# Patient Record
Sex: Female | Born: 1955 | Race: White | Hispanic: No | Marital: Married | Smoking: Never smoker
Health system: Southern US, Community
[De-identification: ages and names within clinical notes are randomized; demographics above are authoritative.]

## PROBLEM LIST (undated history)

## (undated) DIAGNOSIS — E039 Hypothyroidism, unspecified: Secondary | ICD-10-CM

## (undated) DIAGNOSIS — H269 Unspecified cataract: Secondary | ICD-10-CM

## (undated) DIAGNOSIS — J309 Allergic rhinitis, unspecified: Secondary | ICD-10-CM

## (undated) DIAGNOSIS — R112 Nausea with vomiting, unspecified: Secondary | ICD-10-CM

## (undated) DIAGNOSIS — C4491 Basal cell carcinoma of skin, unspecified: Secondary | ICD-10-CM

## (undated) DIAGNOSIS — T7840XA Allergy, unspecified, initial encounter: Secondary | ICD-10-CM

## (undated) DIAGNOSIS — E785 Hyperlipidemia, unspecified: Secondary | ICD-10-CM

## (undated) DIAGNOSIS — M1990 Unspecified osteoarthritis, unspecified site: Secondary | ICD-10-CM

## (undated) DIAGNOSIS — E079 Disorder of thyroid, unspecified: Secondary | ICD-10-CM

## (undated) DIAGNOSIS — E559 Vitamin D deficiency, unspecified: Secondary | ICD-10-CM

## (undated) DIAGNOSIS — Z9889 Other specified postprocedural states: Secondary | ICD-10-CM

## (undated) HISTORY — DX: Vitamin D deficiency, unspecified: E55.9

## (undated) HISTORY — PX: TYMPANOPLASTY: SHX33

## (undated) HISTORY — DX: Basal cell carcinoma of skin, unspecified: C44.91

## (undated) HISTORY — DX: Allergic rhinitis, unspecified: J30.9

## (undated) HISTORY — PX: WISDOM TOOTH EXTRACTION: SHX21

## (undated) HISTORY — DX: Hyperlipidemia, unspecified: E78.5

## (undated) HISTORY — DX: Allergy, unspecified, initial encounter: T78.40XA

## (undated) HISTORY — PX: TUBAL LIGATION: SHX77

## (undated) HISTORY — DX: Unspecified osteoarthritis, unspecified site: M19.90

## (undated) HISTORY — DX: Unspecified cataract: H26.9

---

## 1998-10-30 ENCOUNTER — Other Ambulatory Visit: Admission: RE | Admit: 1998-10-30 | Discharge: 1998-10-30 | Payer: Self-pay | Admitting: Obstetrics & Gynecology

## 1999-01-10 ENCOUNTER — Ambulatory Visit (HOSPITAL_COMMUNITY): Admission: RE | Admit: 1999-01-10 | Discharge: 1999-01-10 | Payer: Self-pay | Admitting: Obstetrics & Gynecology

## 2002-05-05 ENCOUNTER — Other Ambulatory Visit: Admission: RE | Admit: 2002-05-05 | Discharge: 2002-05-05 | Payer: Self-pay | Admitting: Obstetrics and Gynecology

## 2002-05-12 ENCOUNTER — Encounter: Payer: Self-pay | Admitting: Obstetrics and Gynecology

## 2002-05-12 ENCOUNTER — Ambulatory Visit (HOSPITAL_COMMUNITY): Admission: RE | Admit: 2002-05-12 | Discharge: 2002-05-12 | Payer: Self-pay | Admitting: Obstetrics and Gynecology

## 2003-09-07 ENCOUNTER — Other Ambulatory Visit: Admission: RE | Admit: 2003-09-07 | Discharge: 2003-09-07 | Payer: Self-pay | Admitting: Obstetrics and Gynecology

## 2004-10-17 ENCOUNTER — Other Ambulatory Visit: Admission: RE | Admit: 2004-10-17 | Discharge: 2004-10-17 | Payer: Self-pay | Admitting: Obstetrics and Gynecology

## 2004-10-28 ENCOUNTER — Encounter: Admission: RE | Admit: 2004-10-28 | Discharge: 2004-10-28 | Payer: Self-pay | Admitting: Obstetrics and Gynecology

## 2004-11-11 ENCOUNTER — Ambulatory Visit: Payer: Self-pay | Admitting: Internal Medicine

## 2004-12-03 ENCOUNTER — Ambulatory Visit: Payer: Self-pay | Admitting: Internal Medicine

## 2005-04-06 ENCOUNTER — Ambulatory Visit: Payer: Self-pay | Admitting: Internal Medicine

## 2005-11-04 ENCOUNTER — Other Ambulatory Visit: Admission: RE | Admit: 2005-11-04 | Discharge: 2005-11-04 | Payer: Self-pay | Admitting: Obstetrics and Gynecology

## 2009-11-30 HISTORY — PX: COLONOSCOPY: SHX174

## 2009-12-11 ENCOUNTER — Encounter (INDEPENDENT_AMBULATORY_CARE_PROVIDER_SITE_OTHER): Payer: Self-pay | Admitting: *Deleted

## 2010-06-27 ENCOUNTER — Telehealth: Payer: Self-pay | Admitting: Internal Medicine

## 2010-07-10 ENCOUNTER — Encounter (INDEPENDENT_AMBULATORY_CARE_PROVIDER_SITE_OTHER): Payer: Self-pay | Admitting: *Deleted

## 2010-09-09 ENCOUNTER — Encounter (INDEPENDENT_AMBULATORY_CARE_PROVIDER_SITE_OTHER): Payer: Self-pay | Admitting: *Deleted

## 2010-09-12 ENCOUNTER — Ambulatory Visit: Payer: Self-pay | Admitting: Internal Medicine

## 2010-09-17 ENCOUNTER — Telehealth: Payer: Self-pay | Admitting: Internal Medicine

## 2010-09-19 ENCOUNTER — Ambulatory Visit: Payer: Self-pay | Admitting: Internal Medicine

## 2010-09-24 ENCOUNTER — Encounter: Payer: Self-pay | Admitting: Internal Medicine

## 2010-09-25 LAB — HM COLONOSCOPY

## 2010-12-30 NOTE — Progress Notes (Signed)
----   Converted from flag ---- ---- 09/17/2010 12:37 PM, Vallarie Mare wrote: pharmacy verified and correct, please resend prep and call pt... [phone] ------------------------------  Phone Note Call from Patient   Caller: Patient Summary of Call: States that prep wasn't at pharmacy.  I called pharmacy and they did not receive it.  I gave them a verbal ok.   Initial call taken by: Milford Cage NCMA,  September 17, 2010 1:36 PM

## 2010-12-30 NOTE — Procedures (Signed)
Summary: Colonoscopy  Patient: [name] Note: All result statuses are Final unless otherwise noted.  Tests: (1) Colonoscopy (COL)   COL Colonoscopy           DONE     [facility]     [address]     [place], [place]  [address]           COLONOSCOPY PROCEDURE REPORT           PATIENT:  Tracy Brady, Tracy Brady  MR#:  [id number]     BIRTHDATE:  02-22-1956, 54 yrs. old  GENDER:  female     ENDOSCOPIST:  Wilhemina Bonito. Eda Keys, MD     REF. BY:  Surveillance Program Recall,     PROCEDURE DATE:  09/19/2010     PROCEDURE:  Colonoscopy with snare polypectomy x 1     ASA CLASS:  Class II     INDICATIONS:  history of polyps, surveillance and high-risk     screening ; index exam 11-2004 w/ HP polyp     MEDICATIONS:   Fentanyl 50 mcg IV, Versed 7 mg IV           DESCRIPTION OF PROCEDURE:   After the risks benefits and     alternatives of the procedure were thoroughly explained, informed     consent was obtained.  Digital rectal exam was performed and     revealed no abnormalities.   The LB CF-[id number] endoscope     was introduced through the anus and advanced to the cecum, which     was identified by both the appendix and ileocecal valve, without     limitations.Time to cecum = 4:13 min. The quality of the prep was     good, using MoviPrep.  The instrument was then slowly withdrawn     (time = 16;25 min) as the colon was fully examined.     <<PROCEDUREIMAGES>>           FINDINGS:  A diminutive polyp was found in the ascending colon.     Polyp was snared without cautery. Retrieval was successful.   This     was otherwise a normal examination of the colon.   Retroflexed     views in the rectum revealed no abnormalities.    The scope was     then withdrawn from the patient and the procedure completed.           COMPLICATIONS:  None           ENDOSCOPIC IMPRESSION:     1) Diminutive polyp in the ascending colon - removed     2) Otherwise normal examination            RECOMMENDATIONS:     1) Repeat colonoscopy in 5 years if polyp adenomatous; otherwise     10 years           ______________________________     Wilhemina Bonito. Eda Keys, MD           CC:  Lucky Cowboy, MD;  The Patient           n.     eSIGNED:   Wilhemina Bonito. Eda Keys at 09/19/2010 02:32 PM           Bridgette Habermann, [id number]  Note: An exclamation mark (!) indicates a result that was not dispersed into the flowsheet. Document Creation Date: 09/19/2010 2:33 PM _______________________________________________________________________  (1) Order result status: Final Collection or observation date-time: 09/19/2010  14:19 Requested date-time:  Receipt date-time:  Reported date-time:  Referring Physician:   Ordering Physician: Fransico Setters [phone]) Specimen Source:  Source: Launa Grill Order Number: [id number] Lab site:   Appended Document: Colonoscopy recall 10 yrs     Procedures Next Due Date:    Colonoscopy: 08/2020

## 2010-12-30 NOTE — Letter (Signed)
Summary: Colonoscopy Letter  [facility]  [address] [place], [place] [address]   Phone: [phone]  Fax: [phone]      December 11, 2009 MRN: [id number]   LAURANA MAGISTRO [address] [place], [place]  [address]   Dear [name],   According to your medical record, it is time for you to schedule a Colonoscopy. The American Cancer Society recommends this procedure as a method to detect early colon cancer. Patients with a family history of colon cancer, or a personal history of colon polyps or inflammatory bowel disease are at increased risk.  This letter has beeen generated based on the recommendations made at the time of your procedure. If you feel that in your particular situation this may no longer apply, please contact our office.  Please call our office at [phone] to schedule this appointment or to update your records at your earliest convenience.  Thank you for cooperating with [place] to provide you with the very best care possible.   Sincerely,  Wilhemina Bonito. Marina Goodell, M.D.  [facility] [phone]

## 2010-12-30 NOTE — Letter (Signed)
Summary: Patient Notice- Polyp Results  [facility]  [address] [place], [place] [address]   Phone: [phone]  Fax: [phone]        September 24, 2010 MRN: [id number]    [facility] [address] [place], [place]  [address]    Dear [name],  I am pleased to inform you that the colon polyp(s) removed during your recent colonoscopy was (were) found to be benign (no cancer detected) upon pathologic examination.  I recommend you have a repeat colonoscopy examination in 10 years to look for recurrent polyps, as having colon polyps increases your risk for having recurrent polyps or even colon cancer in the future.  Should you develop new or worsening symptoms of abdominal pain, bowel habit changes or bleeding from the rectum or bowels, please schedule an evaluation with either your primary care physician or with me.   Additional information/recommendations:  __ No further action with gastroenterology is needed at this time. Please      follow-up with your primary care physician for your other healthcare      needs.   Please call us if you are having persistent problems or have questions about your condition that have not been fully answered at this time.  Sincerely,  Hilarie Fredrickson MD  This letter has been electronically signed by your physician.  Appended Document: Patient Notice- Polyp Results letter mailed 10.28.11

## 2010-12-30 NOTE — Miscellaneous (Signed)
Summary: previsit prep/rm  Clinical Lists Changes  Medications: Added new medication of MOVIPREP 100 GM  SOLR (PEG-KCL-NACL-NASULF-NA ASC-C) As per prep instructions. - Signed Rx of MOVIPREP 100 GM  SOLR (PEG-KCL-NACL-NASULF-NA ASC-C) As per prep instructions.;  #1 x 0;  Signed;  Entered by: Sherren Kerns RN;  Authorized by: Hilarie Fredrickson MD;  Method used: [id number] [address] [place], [place], [place]  [address], Ph: [phone], Fax: [phone] Observations: Added new observation of ALLERGY REV: Done (09/12/2010 12:49) Added new observation of NKA: T (09/12/2010 12:49)    Prescriptions: MOVIPREP 100 GM  SOLR (PEG-KCL-NACL-NASULF-NA ASC-C) As per prep instructions.  #1 x 0   Entered by:   Sherren Kerns RN   Authorized by:   Hilarie Fredrickson MD   Signed by:   Sherren Kerns RN on 09/12/2010   Method used:   [facility]  [address] [id number]       [address]       [place], [place]  [address]       Ph: [phone]       Fax: [phone]   RxID:   [id number]

## 2010-12-30 NOTE — Progress Notes (Signed)
Summary: Schedule Colonoscopy  Phone Note Outgoing Call Call back at Home Phone [phone]   Call placed by: Harlow Mares CMA Duncan Dull),  June 27, 2010 2:57 PM Call placed to: Patient Summary of Call: Left message on patients machine to call back. patinet is due for a colonoscopy hyperplastic polyps in 2006 Initial call taken by: Harlow Mares CMA Duncan Dull),  June 27, 2010 2:57 PM  Follow-up for Phone Call        patient scheduled colonoscopy for 09/19/2010 and previsit on 09/12/2010. Follow-up by: Harlow Mares CMA (AAMA),  July 10, 2010 9:00 AM

## 2010-12-30 NOTE — Letter (Signed)
Summary: [facility] Instructions  [facility]  [address] [place], [place] [address]   Phone: [phone]  Fax: [phone]       Tracy Brady    08/09/1956    MRN: [id number]        Procedure Day /Date:  Friday 09/19/2010     Arrival Time: 12:30 pm     Procedure Time: 1:30 pm     Location of Procedure:                    _x _  [facility] (4th Floor)                        PREPARATION FOR COLONOSCOPY WITH MOVIPREP   Starting 5 days prior to your procedure Sunday 10/16 do not eat nuts, seeds, popcorn, corn, beans, peas,  salads, or any raw vegetables.  Do not take any fiber supplements (e.g. Metamucil, Citrucel, and Benefiber).  THE DAY BEFORE YOUR PROCEDURE         DATE: Thursday 10/20  1.  Drink clear liquids the entire day-NO SOLID FOOD  2.  Do not drink anything colored red or purple.  Avoid juices with pulp.  No orange juice.  3.  Drink at least 64 oz. (8 glasses) of fluid/clear liquids during the day to prevent dehydration and help the prep work efficiently.  CLEAR LIQUIDS INCLUDE: Water Jello Ice Popsicles Tea (sugar ok, no milk/cream) Powdered fruit flavored drinks Coffee (sugar ok, no milk/cream) Gatorade Juice: apple, white grape, white cranberry  Lemonade Clear bullion, consomm, broth Carbonated beverages (any kind) Strained chicken noodle soup Hard Candy                             4.  In the morning, mix first dose of MoviPrep solution:    Empty 1 Pouch A and 1 Pouch B into the disposable container    Add lukewarm drinking water to the top line of the container. Mix to dissolve    Refrigerate (mixed solution should be used within 24 hrs)  5.  Begin drinking the prep at 5:00 p.m. The MoviPrep container is divided by 4 marks.   Every 15 minutes drink the solution down to the next mark (approximately 8 oz) until the full liter is complete.   6.  Follow completed prep with 16 oz of clear liquid of your choice (Nothing  red or purple).  Continue to drink clear liquids until bedtime.  7.  Before going to bed, mix second dose of MoviPrep solution:    Empty 1 Pouch A and 1 Pouch B into the disposable container    Add lukewarm drinking water to the top line of the container. Mix to dissolve    Refrigerate  THE DAY OF YOUR PROCEDURE      DATE: Friday 10/21  Beginning at 8:30 a.m. (5 hours before procedure):         1. Every 15 minutes, drink the solution down to the next mark (approx 8 oz) until the full liter is complete.  2. Follow completed prep with 16 oz. of clear liquid of your choice.    3. You may drink clear liquids until 11:30 am (2 HOURS BEFORE PROCEDURE).   MEDICATION INSTRUCTIONS  Unless otherwise instructed, you should take regular prescription medications with a small sip of water   as early as possible the morning of your  procedure.   Additional medication instructions: n/a         OTHER INSTRUCTIONS  You will need a responsible adult at least 57 years of age to accompany you and drive you home.   This person must remain in the waiting room during your procedure.  Wear loose fitting clothing that is easily removed.  Leave jewelry and other valuables at home.  However, you may wish to bring a book to read or  an iPod/MP3 player to listen to music as you wait for your procedure to start.  Remove all body piercing jewelry and leave at home.  Total time from sign-in until discharge is approximately 2-3 hours.  You should go home directly after your procedure and rest.  You can resume normal activities the  day after your procedure.  The day of your procedure you should not:   Drive   Make legal decisions   Operate machinery   Drink alcohol   Return to work  You will receive specific instructions about eating, activities and medications before you leave.    The above instructions have been reviewed and explained to me by   Sherren Kerns RN  September 12, 2010 1:01  PM   I fully understand and can verbalize these instructions _____________________________ Date _________

## 2010-12-30 NOTE — Letter (Signed)
Summary: Previsit letter  [facility]  [address] [place], [place] [address]   Phone: [phone]  Fax: [phone]       07/10/2010 MRN: [id number]  Tracy Brady [address] [place], [place]  [address]  Dear [name],  Welcome to the Gastroenterology Division at [facility].    You are scheduled to see a nurse for your pre-procedure visit on 09/12/2010 at 8:30am on the 3rd floor at [facility], [address]  We ask that you try to arrive at our office 15 minutes prior to your appointment time to allow for check-in.  Your nurse visit will consist of discussing your medical and surgical history, your immediate family medical history, and your medications.    Please bring a complete list of all your medications or, if you prefer, bring the medication bottles and we will list them.  We will need to be aware of both prescribed and over the counter drugs.  We will need to know exact dosage information as well.  If you are on blood thinners (Coumadin, Plavix, Aggrenox, Ticlid, etc.) please call our office today/prior to your appointment, as we need to consult with your physician about holding your medication.   Please be prepared to read and sign documents such as consent forms, a financial agreement, and acknowledgement forms.  If necessary, and with your consent, a friend or relative is welcome to sit-in on the nurse visit with you.  Please bring your insurance card so that we may make a copy of it.  If your insurance requires a referral to see a specialist, please bring your referral form from your primary care physician.  No co-pay is required for this nurse visit.     If you cannot keep your appointment, please call [phone] to cancel or reschedule prior to your appointment date.  This allows [place] the opportunity to schedule an appointment for another patient in need of care.    Thank you for choosing [facility] for your medical  needs.  We appreciate the opportunity to care for you.  Please visit [place] at our website  to learn more about our practice.                     Sincerely.                                                                                                                   The Gastroenterology Division

## 2011-10-01 ENCOUNTER — Ambulatory Visit (INDEPENDENT_AMBULATORY_CARE_PROVIDER_SITE_OTHER): Payer: 59 | Admitting: Otolaryngology

## 2011-10-01 DIAGNOSIS — H903 Sensorineural hearing loss, bilateral: Secondary | ICD-10-CM

## 2011-10-01 DIAGNOSIS — H6120 Impacted cerumen, unspecified ear: Secondary | ICD-10-CM

## 2011-10-01 DIAGNOSIS — H7200 Central perforation of tympanic membrane, unspecified ear: Secondary | ICD-10-CM

## 2013-04-17 ENCOUNTER — Ambulatory Visit (HOSPITAL_COMMUNITY)
Admission: RE | Admit: 2013-04-17 | Discharge: 2013-04-17 | Disposition: A | Discharge status: Home / Self Care | Payer: 59 | Source: Ambulatory Visit | Attending: Internal Medicine | Admitting: Internal Medicine

## 2013-04-17 ENCOUNTER — Other Ambulatory Visit (HOSPITAL_COMMUNITY): Payer: Self-pay | Admitting: Internal Medicine

## 2013-04-17 DIAGNOSIS — R52 Pain, unspecified: Secondary | ICD-10-CM

## 2013-04-17 DIAGNOSIS — L299 Pruritus, unspecified: Secondary | ICD-10-CM | POA: Insufficient documentation

## 2013-04-17 DIAGNOSIS — M25511 Pain in right shoulder: Secondary | ICD-10-CM

## 2013-04-17 DIAGNOSIS — M542 Cervicalgia: Secondary | ICD-10-CM

## 2013-04-17 DIAGNOSIS — IMO0002 Reserved for concepts with insufficient information to code with codable children: Secondary | ICD-10-CM | POA: Insufficient documentation

## 2013-04-17 DIAGNOSIS — J984 Other disorders of lung: Secondary | ICD-10-CM | POA: Insufficient documentation

## 2013-04-17 DIAGNOSIS — R209 Unspecified disturbances of skin sensation: Secondary | ICD-10-CM | POA: Insufficient documentation

## 2013-11-21 ENCOUNTER — Encounter: Payer: Self-pay | Admitting: Internal Medicine

## 2013-11-27 ENCOUNTER — Ambulatory Visit: Payer: Self-pay | Admitting: Emergency Medicine

## 2013-12-22 ENCOUNTER — Encounter: Payer: Self-pay | Admitting: Emergency Medicine

## 2013-12-22 ENCOUNTER — Ambulatory Visit (INDEPENDENT_AMBULATORY_CARE_PROVIDER_SITE_OTHER): Payer: 59 | Admitting: Emergency Medicine

## 2013-12-22 VITALS — BP 102/64 | HR 80 | Temp 98.2°F | Resp 16 | Ht 63.0 in | Wt 150.0 lb

## 2013-12-22 DIAGNOSIS — Z79899 Other long term (current) drug therapy: Secondary | ICD-10-CM

## 2013-12-22 DIAGNOSIS — J309 Allergic rhinitis, unspecified: Secondary | ICD-10-CM

## 2013-12-22 DIAGNOSIS — E785 Hyperlipidemia, unspecified: Secondary | ICD-10-CM

## 2013-12-22 DIAGNOSIS — E782 Mixed hyperlipidemia: Secondary | ICD-10-CM

## 2013-12-22 DIAGNOSIS — E559 Vitamin D deficiency, unspecified: Secondary | ICD-10-CM

## 2013-12-22 DIAGNOSIS — C4491 Basal cell carcinoma of skin, unspecified: Secondary | ICD-10-CM

## 2013-12-22 DIAGNOSIS — M25569 Pain in unspecified knee: Secondary | ICD-10-CM

## 2013-12-22 DIAGNOSIS — E538 Deficiency of other specified B group vitamins: Secondary | ICD-10-CM

## 2013-12-22 LAB — CBC WITH DIFFERENTIAL/PLATELET
Basophils Absolute: 0.1 10*3/uL (ref 0.0–0.1)
Basophils Relative: 1 % (ref 0–1)
EOS PCT: 4 % (ref 0–5)
Eosinophils Absolute: 0.2 10*3/uL (ref 0.0–0.7)
HEMATOCRIT: 35.9 % — AB (ref 36.0–46.0)
Hemoglobin: 12.2 g/dL (ref 12.0–15.0)
LYMPHS ABS: 1.3 10*3/uL (ref 0.7–4.0)
Lymphocytes Relative: 25 % (ref 12–46)
MCH: 32.4 pg (ref 26.0–34.0)
MCHC: 34 g/dL (ref 30.0–36.0)
MCV: 95.5 fL (ref 78.0–100.0)
MONO ABS: 0.4 10*3/uL (ref 0.1–1.0)
Monocytes Relative: 7 % (ref 3–12)
Neutro Abs: 3.3 10*3/uL (ref 1.7–7.7)
Neutrophils Relative %: 63 % (ref 43–77)
Platelets: 194 10*3/uL (ref 150–400)
RBC: 3.76 MIL/uL — AB (ref 3.87–5.11)
RDW: 13.4 % (ref 11.5–15.5)
WBC: 5.2 10*3/uL (ref 4.0–10.5)

## 2013-12-22 LAB — LIPID PANEL
Cholesterol: 164 mg/dL (ref 0–200)
HDL: 78 mg/dL (ref >39)
LDL CALC: 76 mg/dL (ref 0–99)
Total CHOL/HDL Ratio: 2.1 Ratio
Triglycerides: 48 mg/dL (ref <150)
VLDL: 10 mg/dL (ref 0–40)

## 2013-12-22 LAB — HEPATIC FUNCTION PANEL
ALT: 12 U/L (ref 0–35)
AST: 15 U/L (ref 0–37)
Albumin: 3.9 g/dL (ref 3.5–5.2)
Alkaline Phosphatase: 56 U/L (ref 39–117)
BILIRUBIN INDIRECT: 0.4 mg/dL (ref 0.0–0.9)
Bilirubin, Direct: 0.1 mg/dL (ref 0.0–0.3)
TOTAL PROTEIN: 6.7 g/dL (ref 6.0–8.3)
Total Bilirubin: 0.5 mg/dL (ref 0.3–1.2)

## 2013-12-22 LAB — BASIC METABOLIC PANEL WITH GFR
BUN: 14 mg/dL (ref 6–23)
CHLORIDE: 105 meq/L (ref 96–112)
CO2: 29 meq/L (ref 19–32)
CREATININE: 0.98 mg/dL (ref 0.50–1.10)
Calcium: 9.2 mg/dL (ref 8.4–10.5)
GFR, Est African American: 73 mL/min
GFR, Est Non African American: 63 mL/min
GLUCOSE: 89 mg/dL (ref 70–99)
Potassium: 4.3 mEq/L (ref 3.5–5.3)
Sodium: 139 mEq/L (ref 135–145)

## 2013-12-22 LAB — URIC ACID: URIC ACID, SERUM: 5.1 mg/dL (ref 2.4–7.0)

## 2013-12-22 LAB — VITAMIN B12: Vitamin B-12: 386 pg/mL (ref 211–911)

## 2013-12-22 LAB — MAGNESIUM: Magnesium: 2 mg/dL (ref 1.5–2.5)

## 2013-12-22 NOTE — Progress Notes (Signed)
   Subjective:    Patient ID: Tracy Brady, female    DOB: [birth year], 58 y.o.   MRN: [id number]  HPI Comments: 58 yo presents for 3 month F/U for  Cholesterol, anemia,  D. deficient She has not been on vitamins x 3 months because she ran out.  She has been taking Lipitor routinely. She is eating healthy. She has not been exercising as much because of recent left knee surgery 10/09/13.   She notes she has improvement with knee symptoms since surgery but not completely. She denies any unusual swelling, she notes the swelling is worse with activity and resolves with RICE.  Hyperlipidemia     Medication List       This list is accurate as of: 12/22/13 11:59 PM.  Always use your most recent med list.               atorvastatin 20 MG tablet  Commonly known as:  LIPITOR  Take 20 mg by mouth every Monday, Wednesday, and Friday.     cyanocobalamin 100 MCG tablet  Take 100 mcg by mouth daily.     Vitamin D-3 5000 UNITS Tabs  Take 5,000 Units by mouth daily.       Review of patient's allergies indicates no known allergies. Past Medical History  Diagnosis Date  . Hyperlipidemia   . Vitamin D deficiency   . Allergic rhinitis   . Basal cell carcinoma        Review of Systems  Musculoskeletal: Positive for joint swelling.  All other systems reviewed and are negative.   BP 102/64  Pulse 80  Temp(Src) 98.2 F (36.8 C) (Temporal)  Resp 16  Ht 5\' 3"  (1.6 m)  Wt 150 lb (68.04 kg)  BMI 26.58 kg/m2     Objective:   Physical Exam  Nursing note and vitals reviewed. Constitutional: She is oriented to person, place, and time. She appears well-developed and well-nourished. No distress.  HENT:  Head: Normocephalic and atraumatic.  Right Ear: External ear normal.  Left Ear: External ear normal.  Nose: Nose normal.  Mouth/Throat: Oropharynx is clear and moist.  Eyes: Conjunctivae and EOM are normal.  Neck: Normal range of motion. Neck supple. No JVD present. No thyromegaly  present.  Cardiovascular: Normal rate, regular rhythm, normal heart sounds and intact distal pulses.   Pulmonary/Chest: Effort normal and breath sounds normal.  Abdominal: Soft. Bowel sounds are normal. She exhibits no distension and no mass. There is no tenderness. There is no rebound and no guarding.  Musculoskeletal: Normal range of motion. She exhibits edema. She exhibits no tenderness.  LEFT KNEE ANTERIOR WITH MILD DISCOMFORT WITH ROM  Lymphadenopathy:    She has no cervical adenopathy.  Neurological: She is alert and oriented to person, place, and time. She has normal reflexes. No cranial nerve deficit.  Skin: Skin is warm and dry. No rash noted. No erythema. No pallor.  Psychiatric: She has a normal mood and affect. Her behavior is normal. Judgment and thought content normal.          Assessment & Plan:  1.  3 month F/U for  Cholesterol, anemia,  D. Deficient. Needs healthy diet, cardio QD and obtain healthy weight. Check Labs, Check BP if >130/80 call office 2. Knee pain- check labs if neg need Xray or Ortho F/U. w/c if SX increase

## 2013-12-22 NOTE — Patient Instructions (Signed)
Gout Gout is when your joints become red, sore, and swell (inflammed). This is caused by the buildup of uric acid crystals in the joints. Uric acid is a chemical that is normally in the blood. If the level of uric acid gets too high in the blood, these crystals form in your joints and tissues. Over time, these crystals can form into masses near the joints and tissues. These masses can destroy bone and cause the bone to look misshapen (deformed). HOME CARE   Do not take aspirin for pain.  Only take medicine as told by your doctor.  Rest the joint as much as you can. When in bed, keep sheets and blankets off painful areas.  Keep the sore joints raised (elevated).  Put warm or cold packs on painful joints. Use of warm or cold packs depends on which works best for you.  Use crutches if the painful joint is in your leg.  Drink enough fluids to keep your pee (urine) clear or pale yellow. Limit alcohol, sugary drinks, and drinks with fructose in them.  Follow your diet instructions. Pay careful attention to how much protein you eat. Include fruits, vegetables, whole grains, and fat-free or low-fat milk products in your daily diet. Talk to your doctor or dietician about the use of coffee, vitamin C, and cherries. These may help lower uric acid levels.  Keep a healthy body weight. GET HELP RIGHT AWAY IF:   You have watery poop (diarrhea), throw up (vomit), or have any side effects from medicines.  You do not feel better in 24 hours, or you are getting worse.  Your joint becomes suddenly more tender, and you have chills or a fever. MAKE SURE YOU:   Understand these instructions.  Will watch your condition.  Will get help right away if you are not doing well or get worse. Document Released: 08/25/2008 Document Revised: 03/13/2013 Document Reviewed: 02/24/2010 [facility] Patient Information 2014 [place]. Knee Pain Knee pain can be a result of an injury or other medical conditions.  Treatment will depend on the cause of your pain. HOME CARE  Only take medicine as told by your doctor.  Keep a healthy weight. Being overweight can make the knee hurt more.  Stretch before exercising or playing sports.  If there is constant knee pain, change the way you exercise. Ask your doctor for advice.  Make sure shoes fit well. Choose the right shoe for the sport or activity.  Protect your knees. Wear kneepads if needed.  Rest when you are tired. GET HELP RIGHT AWAY IF:   Your knee pain does not stop.  Your knee pain does not get better.  Your knee joint feels hot to the touch.  You have a fever. MAKE SURE YOU:   Understand these instructions.  Will watch this condition.  Will get help right away if you are not doing well or get worse. Document Released: 02/12/2009 Document Revised: 02/08/2012 Document Reviewed: 02/12/2009 [facility] Patient Information 2014 [place], [place].

## 2013-12-23 LAB — VITAMIN D 25 HYDROXY (VIT D DEFICIENCY, FRACTURES): Vit D, 25-Hydroxy: 47 ng/mL (ref 30–89)

## 2013-12-25 DIAGNOSIS — C4491 Basal cell carcinoma of skin, unspecified: Secondary | ICD-10-CM | POA: Insufficient documentation

## 2013-12-25 DIAGNOSIS — E559 Vitamin D deficiency, unspecified: Secondary | ICD-10-CM | POA: Insufficient documentation

## 2013-12-25 DIAGNOSIS — Z85828 Personal history of other malignant neoplasm of skin: Secondary | ICD-10-CM | POA: Insufficient documentation

## 2013-12-25 DIAGNOSIS — J309 Allergic rhinitis, unspecified: Secondary | ICD-10-CM | POA: Insufficient documentation

## 2013-12-25 DIAGNOSIS — E785 Hyperlipidemia, unspecified: Secondary | ICD-10-CM | POA: Insufficient documentation

## 2013-12-29 ENCOUNTER — Ambulatory Visit: Payer: Self-pay | Admitting: Emergency Medicine

## 2014-04-24 ENCOUNTER — Ambulatory Visit (INDEPENDENT_AMBULATORY_CARE_PROVIDER_SITE_OTHER): Payer: 59 | Admitting: Emergency Medicine

## 2014-04-24 ENCOUNTER — Encounter: Payer: Self-pay | Admitting: Emergency Medicine

## 2014-04-24 VITALS — BP 104/66 | HR 74 | Temp 98.0°F | Resp 16 | Ht 63.0 in | Wt 149.0 lb

## 2014-04-24 DIAGNOSIS — Z0000 Encounter for general adult medical examination without abnormal findings: Secondary | ICD-10-CM

## 2014-04-24 DIAGNOSIS — Z111 Encounter for screening for respiratory tuberculosis: Secondary | ICD-10-CM

## 2014-04-24 DIAGNOSIS — Z1212 Encounter for screening for malignant neoplasm of rectum: Secondary | ICD-10-CM

## 2014-04-24 DIAGNOSIS — E782 Mixed hyperlipidemia: Secondary | ICD-10-CM

## 2014-04-24 LAB — CBC WITH DIFFERENTIAL/PLATELET
BASOS PCT: 1 % (ref 0–1)
Basophils Absolute: 0 10*3/uL (ref 0.0–0.1)
EOS ABS: 0.1 10*3/uL (ref 0.0–0.7)
Eosinophils Relative: 3 % (ref 0–5)
HCT: 34.9 % — ABNORMAL LOW (ref 36.0–46.0)
HEMOGLOBIN: 11.9 g/dL — AB (ref 12.0–15.0)
LYMPHS ABS: 1.3 10*3/uL (ref 0.7–4.0)
Lymphocytes Relative: 28 % (ref 12–46)
MCH: 31.7 pg (ref 26.0–34.0)
MCHC: 34.1 g/dL (ref 30.0–36.0)
MCV: 93.1 fL (ref 78.0–100.0)
Monocytes Absolute: 0.4 10*3/uL (ref 0.1–1.0)
Monocytes Relative: 8 % (ref 3–12)
Neutro Abs: 2.8 10*3/uL (ref 1.7–7.7)
Neutrophils Relative %: 60 % (ref 43–77)
Platelets: 186 10*3/uL (ref 150–400)
RBC: 3.75 MIL/uL — ABNORMAL LOW (ref 3.87–5.11)
RDW: 13.4 % (ref 11.5–15.5)
WBC: 4.7 10*3/uL (ref 4.0–10.5)

## 2014-04-24 LAB — HEMOGLOBIN A1C
Hgb A1c MFr Bld: 5.7 % — ABNORMAL HIGH (ref <5.7)
Mean Plasma Glucose: 117 mg/dL — ABNORMAL HIGH (ref <117)

## 2014-04-24 NOTE — Progress Notes (Signed)
Subjective:    Patient ID: Tracy Brady, female    DOB: [birth year], 58 y.o.   MRN: [id number]  HPI Comments: 58 yo female for CPE and cholesterol f/u. She is up 11 # since last CPE.She is trying to improve weight and diet.   She has been taking OTC fluid pill for several months x couple days each episode and relieves swelling in both ankles. She denies any other CV symptoms.She does note it usually occurs after increased sodium.   She notes right arm itches but has had negative derm evaluation. She notes usually occurs with increased stress level without obvious rash. She denies any neck injury or pain.   WBC             5.2   12/22/2013 HGB            12.2   12/22/2013 HCT            35.9   12/22/2013 PLT             194   12/22/2013 GLUCOSE          89   12/22/2013 CHOL            164   12/22/2013 TRIG             48   12/22/2013 HDL              78   12/22/2013 LDLCALC          76   12/22/2013 ALT              12   12/22/2013 AST              15   12/22/2013 NA              139   12/22/2013 K               4.3   12/22/2013 CL              105   12/22/2013 CREATININE     0.98   12/22/2013 BUN              14   12/22/2013 CO2              29   12/22/2013 Vit D 47 a1c 5.4 HEP PANEL NEG    Medication List       This list is accurate as of: 04/24/14 11:59 PM.  Always use your most recent med list.               atorvastatin 20 MG tablet  Commonly known as:  LIPITOR  Take 20 mg by mouth every Monday, Wednesday, and Friday.     Magnesium 250 MG Tabs  Take 250 mg by mouth daily.     SUPER B COMPLEX PO  Take 1 tablet by mouth daily.     Vitamin D-3 5000 UNITS Tabs  Take 5,000 Units by mouth daily.       No Known Allergies  Past Medical History  Diagnosis Date  . Hyperlipidemia   . Vitamin D deficiency   . Allergic rhinitis   . Basal cell carcinoma    Past Surgical History  Procedure Laterality Date  . Tympanoplasty Left    History  Substance Use Topics  . Smoking  status: Never Smoker   . Smokeless tobacco: Not on file  . Alcohol Use: No   Family History  Problem  Relation Age of Onset  . Hypertension Mother   . Heart disease Mother   . Hyperlipidemia Mother   . Heart attack Mother    MAINTENANCE: Colonoscopy:08/2010 polyp 2016 Mammo:12/15/2013 [place] WNL BMD: 06/2008 WNL Pap/ Pelvic: 1/ 2015 [id number] Dentist: Q 31month  IMMUNIZATIONS: [id number] Pneumovax:2014 Zostavax: Influenza:2014  Patient Care Team: Unk Pinto, MD as PCP - General (Internal Medicine) Viona Gilmore Evette Cristal, MD as Consulting Physician (Obstetrics and Gynecology) Ascencion Dike, MD as Consulting Physician (Otolaryngology) Laurence Aly, OD (Optometry) Irene Shipper, MD as Consulting Physician (Gastroenterology) Signe Colt, MD as Referring Physician (Obstetrics and Gynecology) Bonnita Levan, MD as Consulting Physician (Dermatology) Pool, (Dentist)    Review of Systems  Neurological: Positive for numbness.  All other systems reviewed and are negative.  BP 104/66  Pulse 74  Temp(Src) 98 F (36.7 C) (Temporal)  Resp 16  Ht 5\' 3"  (1.6 m)  Wt 149 lb (67.586 kg)  BMI 26.40 kg/m2     Objective:   Physical Exam  Nursing note and vitals reviewed. Constitutional: She is oriented to person, place, and time. She appears well-developed and well-nourished. No distress.  HENT:  Head: Normocephalic and atraumatic.  Right Ear: External ear normal.  Left Ear: External ear normal.  Nose: Nose normal.  Mouth/Throat: Oropharynx is clear and moist. No oropharyngeal exudate.  Eyes: Conjunctivae and EOM are normal. Pupils are equal, round, and reactive to light. Right eye exhibits no discharge. Left eye exhibits no discharge. No scleral icterus.  Neck: Normal range of motion. Neck supple. No JVD present. No tracheal deviation present. No thyromegaly present.  Cardiovascular: Normal rate, regular rhythm, normal heart sounds and intact distal pulses.   Varicose veins  soft NT Bilateral LE, minimal edema bil LE  Pulmonary/Chest: Effort normal and breath sounds normal.  Abdominal: Soft. Bowel sounds are normal. She exhibits no distension and no mass. There is no tenderness. There is no rebound and no guarding.  Genitourinary:  Def GYN  Musculoskeletal: Normal range of motion. She exhibits no edema and no tenderness.  Mild discomfort with ROM Neck  Lymphadenopathy:    She has no cervical adenopathy.  Neurological: She is alert and oriented to person, place, and time. She has normal reflexes. No cranial nerve deficit. She exhibits normal muscle tone. Coordination normal.  Skin: Skin is warm and dry. No rash noted. No erythema. No pallor.  Psychiatric: She has a normal mood and affect. Her behavior is normal. Judgment and thought content normal.      AORTA SCAN WNL EKG NSCSPT     Assessment & Plan:  1. CPE- Update screening labs/ History/ Immunizations/ Testing as needed. Advised healthy diet, QD exercise, increase H20 and continue RX/ Vitamins AD.   2. Cholesterol- recheck labs, Need to eat healthier and exercise AD.   3. ? Arm itching vs Nerves vs Degenerative changes- Will monitor for now and call if wants neck XRAY  4. ? Edema- advised of natural remedies and decfrease sodium with increase H2o

## 2014-04-24 NOTE — Patient Instructions (Signed)

## 2014-04-25 LAB — LIPID PANEL
CHOLESTEROL: 145 mg/dL (ref 0–200)
HDL: 70 mg/dL (ref >39)
LDL Cholesterol: 65 mg/dL (ref 0–99)
Total CHOL/HDL Ratio: 2.1 Ratio
Triglycerides: 48 mg/dL (ref <150)
VLDL: 10 mg/dL (ref 0–40)

## 2014-04-25 LAB — URINALYSIS, MICROSCOPIC ONLY
Bacteria, UA: NONE SEEN
CASTS: NONE SEEN
Crystals: NONE SEEN
Squamous Epithelial / LPF: NONE SEEN

## 2014-04-25 LAB — BASIC METABOLIC PANEL WITH GFR
BUN: 10 mg/dL (ref 6–23)
CALCIUM: 9 mg/dL (ref 8.4–10.5)
CO2: 28 mEq/L (ref 19–32)
Chloride: 103 mEq/L (ref 96–112)
Creat: 0.9 mg/dL (ref 0.50–1.10)
GFR, EST AFRICAN AMERICAN: 80 mL/min
GFR, Est Non African American: 70 mL/min
GLUCOSE: 83 mg/dL (ref 70–99)
Potassium: 3.6 mEq/L (ref 3.5–5.3)
Sodium: 141 mEq/L (ref 135–145)

## 2014-04-25 LAB — INSULIN, FASTING: Insulin fasting, serum: 7 u[IU]/mL (ref 3–28)

## 2014-04-25 LAB — HEPATIC FUNCTION PANEL
ALBUMIN: 3.9 g/dL (ref 3.5–5.2)
ALT: 14 U/L (ref 0–35)
AST: 17 U/L (ref 0–37)
Alkaline Phosphatase: 63 U/L (ref 39–117)
BILIRUBIN TOTAL: 0.5 mg/dL (ref 0.2–1.2)
Bilirubin, Direct: 0.1 mg/dL (ref 0.0–0.3)
Indirect Bilirubin: 0.4 mg/dL (ref 0.2–1.2)
Total Protein: 6.3 g/dL (ref 6.0–8.3)

## 2014-04-25 LAB — URINALYSIS, ROUTINE W REFLEX MICROSCOPIC
BILIRUBIN URINE: NEGATIVE
Glucose, UA: NEGATIVE mg/dL
Hgb urine dipstick: NEGATIVE
Ketones, ur: NEGATIVE mg/dL
Nitrite: NEGATIVE
PH: 7 (ref 5.0–8.0)
Protein, ur: NEGATIVE mg/dL
SPECIFIC GRAVITY, URINE: 1.011 (ref 1.005–1.030)
Urobilinogen, UA: 0.2 mg/dL (ref 0.0–1.0)

## 2014-04-25 LAB — MICROALBUMIN / CREATININE URINE RATIO
CREATININE, URINE: 66.9 mg/dL
Microalb Creat Ratio: 7.5 mg/g (ref 0.0–30.0)
Microalb, Ur: 0.5 mg/dL (ref 0.00–1.89)

## 2014-04-25 LAB — IRON AND TIBC
%SAT: 26 % (ref 20–55)
Iron: 83 ug/dL (ref 42–145)
TIBC: 314 ug/dL (ref 250–470)
UIBC: 231 ug/dL (ref 125–400)

## 2014-04-25 LAB — MAGNESIUM: Magnesium: 1.9 mg/dL (ref 1.5–2.5)

## 2014-04-25 LAB — VITAMIN B12: Vitamin B-12: 472 pg/mL (ref 211–911)

## 2014-04-25 LAB — TSH: TSH: 3.273 u[IU]/mL (ref 0.350–4.500)

## 2014-04-25 LAB — VITAMIN D 25 HYDROXY (VIT D DEFICIENCY, FRACTURES): Vit D, 25-Hydroxy: 62 ng/mL (ref 30–89)

## 2014-04-27 LAB — TB SKIN TEST
Induration: 0 mm
TB Skin Test: NEGATIVE

## 2014-05-02 ENCOUNTER — Other Ambulatory Visit: Payer: Self-pay | Admitting: Emergency Medicine

## 2014-05-02 ENCOUNTER — Encounter: Payer: Self-pay | Admitting: Internal Medicine

## 2014-05-02 MED ORDER — HYDROCHLOROTHIAZIDE 25 MG PO TABS: 25.0000 mg | ORAL_TABLET | Freq: Every day | ORAL | Status: DC

## 2014-05-07 ENCOUNTER — Encounter: Payer: Self-pay | Admitting: Emergency Medicine

## 2014-05-08 ENCOUNTER — Encounter: Payer: Self-pay | Admitting: *Deleted

## 2014-06-13 ENCOUNTER — Encounter: Payer: Self-pay | Admitting: Emergency Medicine

## 2014-06-13 ENCOUNTER — Other Ambulatory Visit: Payer: Self-pay | Admitting: Emergency Medicine

## 2014-06-13 MED ORDER — HYDROCHLOROTHIAZIDE 25 MG PO TABS: 25.0000 mg | ORAL_TABLET | Freq: Every day | ORAL | Status: DC

## 2014-06-18 ENCOUNTER — Other Ambulatory Visit: Payer: Self-pay | Admitting: Emergency Medicine

## 2014-06-18 MED ORDER — HYDROCHLOROTHIAZIDE 25 MG PO TABS: 25.0000 mg | ORAL_TABLET | Freq: Every day | ORAL | Status: DC

## 2014-07-12 ENCOUNTER — Telehealth: Payer: Self-pay | Admitting: Internal Medicine

## 2014-07-13 ENCOUNTER — Encounter: Payer: Self-pay | Admitting: Internal Medicine

## 2014-08-13 NOTE — Telephone Encounter (Signed)
CLOSED

## 2014-09-07 ENCOUNTER — Ambulatory Visit: Payer: Self-pay | Admitting: Emergency Medicine

## 2014-09-14 ENCOUNTER — Other Ambulatory Visit: Payer: Self-pay

## 2015-04-25 ENCOUNTER — Encounter: Payer: Self-pay | Admitting: Emergency Medicine

## 2015-05-02 ENCOUNTER — Encounter: Payer: Self-pay | Admitting: Physician Assistant

## 2015-05-03 ENCOUNTER — Ambulatory Visit (INDEPENDENT_AMBULATORY_CARE_PROVIDER_SITE_OTHER): Payer: 59 | Admitting: Internal Medicine

## 2015-05-03 ENCOUNTER — Encounter: Payer: Self-pay | Admitting: Internal Medicine

## 2015-05-03 VITALS — BP 108/64 | HR 74 | Temp 97.8°F | Resp 16 | Ht 63.0 in | Wt 132.0 lb

## 2015-05-03 DIAGNOSIS — E785 Hyperlipidemia, unspecified: Secondary | ICD-10-CM

## 2015-05-03 DIAGNOSIS — R030 Elevated blood-pressure reading, without diagnosis of hypertension: Secondary | ICD-10-CM

## 2015-05-03 DIAGNOSIS — Z79899 Other long term (current) drug therapy: Secondary | ICD-10-CM

## 2015-05-03 DIAGNOSIS — E559 Vitamin D deficiency, unspecified: Secondary | ICD-10-CM

## 2015-05-03 DIAGNOSIS — R5383 Other fatigue: Secondary | ICD-10-CM

## 2015-05-03 DIAGNOSIS — R7309 Other abnormal glucose: Secondary | ICD-10-CM

## 2015-05-03 DIAGNOSIS — Z1212 Encounter for screening for malignant neoplasm of rectum: Secondary | ICD-10-CM

## 2015-05-03 DIAGNOSIS — J309 Allergic rhinitis, unspecified: Secondary | ICD-10-CM

## 2015-05-03 DIAGNOSIS — IMO0001 Reserved for inherently not codable concepts without codable children: Secondary | ICD-10-CM

## 2015-05-03 MED ORDER — HYDROCHLOROTHIAZIDE 25 MG PO TABS: 25.0000 mg | ORAL_TABLET | Freq: Every day | ORAL | Status: DC

## 2015-05-03 NOTE — Progress Notes (Signed)
Patient ID: Tracy Brady, female   DOB: 11-12-1956, 59 y.o.   MRN: [id number]  Complete Physical  Assessment and Plan:   1. Allergic rhinitis, unspecified allergic rhinitis type -cont OTC meds -currently well controlled  2. Hyperlipidemia -consider d/c lipitor based on labs and take holiday form statin - Lipid panel  3. Vitamin D deficiency -cont supplement - Vit D  25 hydroxy (rtn osteoporosis monitoring)  4. Abnormal glucose  - Hemoglobin A1c - Insulin, random  5. Elevated BP  - Urinalysis, Routine w reflex microscopic (not at [facility]) - Microalbumin / creatinine urine ratio - EKG 12-Lead - [place], RETROPERITNL ABD,  LTD - TSH  6. Screening for rectal cancer -set up colonoscopy - POC Hemoccult Bld/Stl (3-Cd Home Screen); Future  7. Medication management  - CBC with Differential/Platelet - BASIC METABOLIC PANEL WITH GFR - Hepatic function panel - Magnesium  8. Other fatigue  - Iron and TIBC - Vitamin B12    Discussed med's effects and SE's. Screening labs and tests as requested with regular follow-up as recommended.  HPI  59 y.o. female  presents for a complete physical.  Her blood pressure has been controlled at home, today their BP is BP: 108/64 mmHg.  She does workout. She denies chest pain, shortness of breath, dizziness.   She is on cholesterol medication and denies myalgias. Her cholesterol is at goal. The cholesterol last visit was:  Lab Results  Component Value Date   CHOL 145 04/24/2014   HDL 70 04/24/2014   LDLCALC 65 04/24/2014   TRIG 48 04/24/2014   CHOLHDL 2.1 04/24/2014  .  She has been working on diet and exercise for elevated blood glucose, she is not on bASA, she is not on ACE/ARB and denies foot ulcerations, hyperglycemia, hypoglycemia , increased appetite, nausea, paresthesia of the feet, polydipsia, polyuria, visual disturbances, vomiting and weight loss. Last A1C in the office was:  Lab Results  Component Value Date   HGBA1C 5.7*  04/24/2014    Patient is on Vitamin D supplement.   Lab Results  Component Value Date   VD25OH 41 04/24/2014     Patient reports that she does have a stressful job and finds that when she is stressed she does have some itching of the right arm only.  She has seen a dermatologist and they did not see anything of substance there.  She reports that it really only happens with stress.  She does not want to do anything about this right now.    She is up to date on her mammogram and her bone density scanning done at [facility].    She just had a colonoscopy in 2011 and is up to date with that.  She is due for a colonoscopy this year.      Current Medications:  Current Outpatient Prescriptions on File Prior to Visit  Medication Sig Dispense Refill  . atorvastatin (LIPITOR) 20 MG tablet Take 20 mg by mouth every Monday, Wednesday, and Friday.    . B Complex-C (SUPER B COMPLEX PO) Take 1 tablet by mouth daily.    . Cholecalciferol (VITAMIN D-3) 5000 UNITS TABS Take 5,000 Units by mouth daily.    . hydrochlorothiazide (HYDRODIURIL) 25 MG tablet Take 1 tablet (25 mg total) by mouth daily. 90 tablet 0  . Magnesium 250 MG TABS Take 250 mg by mouth daily.     No current facility-administered medications on file prior to visit.    Health Maintenance:   Immunization History  Administered Date(s) Administered  . Influenza Whole 09/02/2013  . PPD Test 04/24/2014  . Pneumococcal Polysaccharide-23 04/13/2013  . Tdap 10/16/2008      Patient Care Team: Unk Pinto, MD as PCP - General (Internal Medicine) Viona Gilmore Evette Cristal, MD as Consulting Physician (Obstetrics and Gynecology) Leta Baptist, MD as Consulting Physician (Otolaryngology) Laurence Aly, OD (Optometry) Irene Shipper, MD as Consulting Physician (Gastroenterology) Signe Colt, MD as Referring Physician (Obstetrics and Gynecology) Danella Sensing, MD as Consulting Physician (Dermatology)  Allergies: No Known Allergies  Medical History:   Past Medical History  Diagnosis Date  . Hyperlipidemia   . Vitamin D deficiency   . Allergic rhinitis   . Basal cell carcinoma     Surgical History:  Past Surgical History  Procedure Laterality Date  . Tympanoplasty Left     Family History:  Family History  Problem Relation Age of Onset  . Hypertension Mother   . Heart disease Mother   . Hyperlipidemia Mother   . Heart attack Mother     Social History:  History  Substance Use Topics  . Smoking status: Never Smoker   . Smokeless tobacco: Not on file  . Alcohol Use: No    Review of Systems: Review of Systems  Constitutional: Negative for fever, chills and malaise/fatigue.  HENT: Negative for congestion, ear pain and sore throat.   Eyes: Negative for blurred vision and double vision.  Respiratory: Negative for cough, shortness of breath and wheezing.   Cardiovascular: Positive for leg swelling. Negative for chest pain and palpitations.  Gastrointestinal: Positive for constipation. Negative for heartburn, nausea, vomiting, diarrhea, blood in stool and melena.  Genitourinary: Negative for dysuria, urgency and frequency.  Musculoskeletal: Positive for myalgias.  Skin: Positive for itching.  Neurological: Negative for dizziness, sensory change and headaches.  Psychiatric/Behavioral: Negative for depression. The patient is not nervous/anxious and does not have insomnia.     Physical Exam: Estimated body mass index is 23.39 kg/(m^2) as calculated from the following:   Height as of this encounter: 5\' 3"  (1.6 m).   Weight as of this encounter: 132 lb (59.875 kg). BP 108/64 mmHg  Pulse 74  Temp(Src) 97.8 F (36.6 C) (Temporal)  Resp 16  Ht 5\' 3"  (1.6 m)  Wt 132 lb (59.875 kg)  BMI 23.39 kg/m2  General Appearance: Well nourished well developed, in no apparent distress.  Eyes: PERRLA, EOMs, conjunctiva no swelling or erythema ENT/Mouth: Ear canals normal without obstruction, swelling, erythema, or discharge.  TMs  normal bilaterally with no erythema, bulging, retraction, or loss of landmark.  Oropharynx moist and clear with no exudate, erythema, or swelling.   Neck: Supple, thyroid normal. No bruits.  No cervical adenopathy Respiratory: Respiratory effort normal, Breath sounds clear A&P without wheeze, rhonchi, rales.   Cardio: RRR without murmurs, rubs or gallops. Brisk peripheral pulses without edema.  Chest: symmetric, with normal excursions Breasts: Symmetric, without lumps, nipple discharge, retractions.  Abdomen: Soft, nontender, no guarding, rebound, hernias, masses, or organomegaly.  Lymphatics: Non tender without lymphadenopathy.  Musculoskeletal: Full ROM all peripheral extremities,5/5 strength, and normal gait.  Skin: Warm, dry without rashes, lesions, ecchymosis. Neuro: Awake and oriented X 3, Cranial nerves intact, reflexes equal bilaterally. Normal muscle tone, no cerebellar symptoms. Sensation intact.  Psych:  normal affect, Insight and Judgment appropriate.   EKG: WNL no changes.  AORTA SCAN: WNL   Over 40 minutes of exam, counseling, chart review and critical decision making was performed  FORCUCCI, Joetta Delprado 9:49 AM [facility]

## 2015-05-03 NOTE — Patient Instructions (Signed)

## 2015-05-04 LAB — LIPID PANEL
CHOLESTEROL: 190 mg/dL (ref 0–200)
HDL: 70 mg/dL (ref >=46)
LDL CALC: 111 mg/dL — AB (ref 0–99)
Total CHOL/HDL Ratio: 2.7 Ratio
Triglycerides: 46 mg/dL (ref <150)
VLDL: 9 mg/dL (ref 0–40)

## 2015-05-04 LAB — URINALYSIS, ROUTINE W REFLEX MICROSCOPIC
BILIRUBIN URINE: NEGATIVE
Glucose, UA: NEGATIVE mg/dL
Hgb urine dipstick: NEGATIVE
Ketones, ur: NEGATIVE mg/dL
Leukocytes, UA: NEGATIVE
NITRITE: NEGATIVE
PROTEIN: NEGATIVE mg/dL
Urobilinogen, UA: 1 mg/dL (ref 0.0–1.0)
pH: 7.5 (ref 5.0–8.0)

## 2015-05-04 LAB — CBC WITH DIFFERENTIAL/PLATELET
BASOS ABS: 0.1 10*3/uL (ref 0.0–0.1)
Basophils Relative: 1 % (ref 0–1)
Eosinophils Absolute: 0.2 10*3/uL (ref 0.0–0.7)
Eosinophils Relative: 4 % (ref 0–5)
HEMATOCRIT: 35.5 % — AB (ref 36.0–46.0)
Hemoglobin: 11.6 g/dL — ABNORMAL LOW (ref 12.0–15.0)
LYMPHS PCT: 26 % (ref 12–46)
Lymphs Abs: 1.4 10*3/uL (ref 0.7–4.0)
MCH: 31.4 pg (ref 26.0–34.0)
MCHC: 32.7 g/dL (ref 30.0–36.0)
MCV: 95.9 fL (ref 78.0–100.0)
MONO ABS: 0.4 10*3/uL (ref 0.1–1.0)
MPV: 10.5 fL (ref 8.6–12.4)
Monocytes Relative: 8 % (ref 3–12)
Neutro Abs: 3.3 10*3/uL (ref 1.7–7.7)
Neutrophils Relative %: 61 % (ref 43–77)
Platelets: 199 10*3/uL (ref 150–400)
RBC: 3.7 MIL/uL — ABNORMAL LOW (ref 3.87–5.11)
RDW: 13.4 % (ref 11.5–15.5)
WBC: 5.4 10*3/uL (ref 4.0–10.5)

## 2015-05-04 LAB — MAGNESIUM: Magnesium: 2 mg/dL (ref 1.5–2.5)

## 2015-05-04 LAB — BASIC METABOLIC PANEL WITH GFR
BUN: 15 mg/dL (ref 6–23)
CO2: 29 mEq/L (ref 19–32)
Calcium: 8.7 mg/dL (ref 8.4–10.5)
Chloride: 101 mEq/L (ref 96–112)
Creat: 0.88 mg/dL (ref 0.50–1.10)
GFR, Est African American: 82 mL/min
GFR, Est Non African American: 71 mL/min
Glucose, Bld: 82 mg/dL (ref 70–99)
Potassium: 3.6 mEq/L (ref 3.5–5.3)
Sodium: 139 mEq/L (ref 135–145)

## 2015-05-04 LAB — HEPATIC FUNCTION PANEL
ALBUMIN: 3.8 g/dL (ref 3.5–5.2)
ALT: 13 U/L (ref 0–35)
AST: 17 U/L (ref 0–37)
Alkaline Phosphatase: 60 U/L (ref 39–117)
BILIRUBIN INDIRECT: 0.4 mg/dL (ref 0.2–1.2)
BILIRUBIN TOTAL: 0.5 mg/dL (ref 0.2–1.2)
Bilirubin, Direct: 0.1 mg/dL (ref 0.0–0.3)
Total Protein: 6.5 g/dL (ref 6.0–8.3)

## 2015-05-04 LAB — MICROALBUMIN / CREATININE URINE RATIO
Creatinine, Urine: 87.2 mg/dL
MICROALB/CREAT RATIO: 2.3 mg/g (ref 0.0–30.0)
Microalb, Ur: 0.2 mg/dL (ref <2.0)

## 2015-05-04 LAB — TSH: TSH: 4.461 u[IU]/mL (ref 0.350–4.500)

## 2015-05-04 LAB — VITAMIN D 25 HYDROXY (VIT D DEFICIENCY, FRACTURES): VIT D 25 HYDROXY: 45 ng/mL (ref 30–100)

## 2015-05-04 LAB — VITAMIN B12: Vitamin B-12: 629 pg/mL (ref 211–911)

## 2015-05-04 LAB — IRON AND TIBC
%SAT: 23 % (ref 20–55)
Iron: 67 ug/dL (ref 42–145)
TIBC: 296 ug/dL (ref 250–470)
UIBC: 229 ug/dL (ref 125–400)

## 2015-05-04 LAB — INSULIN, RANDOM: INSULIN: 4 u[IU]/mL (ref 2.0–19.6)

## 2015-05-04 LAB — HEMOGLOBIN A1C
HEMOGLOBIN A1C: 5.4 % (ref <5.7)
Mean Plasma Glucose: 108 mg/dL (ref <117)

## 2015-05-21 ENCOUNTER — Other Ambulatory Visit: Payer: Self-pay | Admitting: Internal Medicine

## 2015-05-21 ENCOUNTER — Encounter: Payer: Self-pay | Admitting: Internal Medicine

## 2015-05-21 MED ORDER — SERTRALINE HCL 50 MG PO TABS: 50.0000 mg | ORAL_TABLET | Freq: Every day | ORAL | Status: DC

## 2015-08-09 LAB — HM PAP SMEAR: HM PAP: NEGATIVE

## 2015-09-03 ENCOUNTER — Encounter: Payer: Self-pay | Admitting: Internal Medicine

## 2015-11-08 ENCOUNTER — Ambulatory Visit: Payer: Self-pay | Admitting: Internal Medicine

## 2015-11-08 ENCOUNTER — Ambulatory Visit (INDEPENDENT_AMBULATORY_CARE_PROVIDER_SITE_OTHER): Payer: 59 | Admitting: Internal Medicine

## 2015-11-08 ENCOUNTER — Encounter: Payer: Self-pay | Admitting: Internal Medicine

## 2015-11-08 VITALS — BP 116/60 | HR 74 | Temp 98.2°F | Resp 16 | Ht 63.0 in | Wt 161.0 lb

## 2015-11-08 DIAGNOSIS — I10 Essential (primary) hypertension: Secondary | ICD-10-CM | POA: Diagnosis not present

## 2015-11-08 DIAGNOSIS — Z79899 Other long term (current) drug therapy: Secondary | ICD-10-CM

## 2015-11-08 DIAGNOSIS — Z23 Encounter for immunization: Secondary | ICD-10-CM | POA: Diagnosis not present

## 2015-11-08 DIAGNOSIS — E559 Vitamin D deficiency, unspecified: Secondary | ICD-10-CM

## 2015-11-08 DIAGNOSIS — E785 Hyperlipidemia, unspecified: Secondary | ICD-10-CM

## 2015-11-08 LAB — CBC WITH DIFFERENTIAL/PLATELET
BASOS ABS: 0 10*3/uL (ref 0.0–0.1)
Basophils Relative: 1 % (ref 0–1)
Eosinophils Absolute: 0.2 10*3/uL (ref 0.0–0.7)
Eosinophils Relative: 5 % (ref 0–5)
HEMATOCRIT: 35.7 % — AB (ref 36.0–46.0)
HEMOGLOBIN: 11.9 g/dL — AB (ref 12.0–15.0)
LYMPHS ABS: 1.2 10*3/uL (ref 0.7–4.0)
LYMPHS PCT: 28 % (ref 12–46)
MCH: 31.4 pg (ref 26.0–34.0)
MCHC: 33.3 g/dL (ref 30.0–36.0)
MCV: 94.2 fL (ref 78.0–100.0)
MPV: 9.9 fL (ref 8.6–12.4)
Monocytes Absolute: 0.4 10*3/uL (ref 0.1–1.0)
Monocytes Relative: 10 % (ref 3–12)
NEUTROS ABS: 2.4 10*3/uL (ref 1.7–7.7)
Neutrophils Relative %: 56 % (ref 43–77)
Platelets: 208 10*3/uL (ref 150–400)
RBC: 3.79 MIL/uL — AB (ref 3.87–5.11)
RDW: 13 % (ref 11.5–15.5)
WBC: 4.3 10*3/uL (ref 4.0–10.5)

## 2015-11-08 LAB — LIPID PANEL
Cholesterol: 200 mg/dL (ref 125–200)
HDL: 84 mg/dL (ref >=46)
LDL Cholesterol: 109 mg/dL (ref <130)
Total CHOL/HDL Ratio: 2.4 ratio (ref <=5.0)
Triglycerides: 34 mg/dL (ref <150)
VLDL: 7 mg/dL (ref <30)

## 2015-11-08 LAB — BASIC METABOLIC PANEL WITHOUT GFR
BUN: 18 mg/dL (ref 7–25)
CO2: 26 mmol/L (ref 20–31)
Calcium: 8.9 mg/dL (ref 8.6–10.4)
Chloride: 105 mmol/L (ref 98–110)
Creat: 0.88 mg/dL (ref 0.50–0.99)
GFR, Est African American: 82 mL/min (ref >=60)
GFR, Est Non African American: 71 mL/min (ref >=60)
Glucose, Bld: 76 mg/dL (ref 65–99)
Potassium: 4.2 mmol/L (ref 3.5–5.3)
Sodium: 140 mmol/L (ref 135–146)

## 2015-11-08 LAB — HEPATIC FUNCTION PANEL
ALK PHOS: 55 U/L (ref 33–130)
ALT: 10 U/L (ref 6–29)
AST: 14 U/L (ref 10–35)
Albumin: 3.4 g/dL — ABNORMAL LOW (ref 3.6–5.1)
BILIRUBIN INDIRECT: 0.4 mg/dL (ref 0.2–1.2)
Bilirubin, Direct: 0.1 mg/dL (ref <=0.2)
TOTAL PROTEIN: 6.3 g/dL (ref 6.1–8.1)
Total Bilirubin: 0.5 mg/dL (ref 0.2–1.2)

## 2015-11-08 LAB — TSH: TSH: 3.411 u[IU]/mL (ref 0.350–4.500)

## 2015-11-08 NOTE — Progress Notes (Signed)
Patient ID: Tracy Brady, female   DOB: May 07, 1956, 59 y.o.   MRN: [id number]  Assessment and Plan:  Hypertension:  -Continue medication,  -monitor blood pressure at home.  -Continue DASH diet.   -Reminder to go to the ER if any CP, SOB, nausea, dizziness, severe HA, changes vision/speech, left arm numbness and tingling, and jaw pain.  Cholesterol: -went off medication will restart pending labs -Continue diet and exercise.  -Check cholesterol.   Pre-diabetes: -Continue diet and exercise.  -Check A1C  Vitamin D Def: -check level -continue medications.   Continue diet and meds as discussed. Further disposition pending results of labs.  HPI 59 y.o. female  presents for 3 month follow up with hypertension, hyperlipidemia, prediabetes and vitamin D.   Her blood pressure has been controlled at home, today their BP is BP: 116/60 mmHg.   She does not workout. She denies chest pain, shortness of breath, dizziness.   She is on cholesterol medication and denies myalgias. Her cholesterol is at goal. The cholesterol last visit was:   Lab Results  Component Value Date   CHOL 190 05/03/2015   HDL 70 05/03/2015   LDLCALC 111* 05/03/2015   TRIG 46 05/03/2015   CHOLHDL 2.7 05/03/2015     She has been working on diet and exercise for prediabetes, and denies foot ulcerations, hyperglycemia, hypoglycemia , increased appetite, nausea, paresthesia of the feet, polydipsia, polyuria, visual disturbances, vomiting and weight loss. Last A1C in the office was:  Lab Results  Component Value Date   HGBA1C 5.4 05/03/2015    Patient is on Vitamin D supplement.  Lab Results  Component Value Date   VD25OH 45 05/03/2015     She did go off all medications for no apparent reason.  She reports that she will go back on them if she needs to but if she doesn't she would prefer to stay off it.  Current Medications:  Current Outpatient Prescriptions on File Prior to Visit  Medication Sig Dispense Refill   . atorvastatin (LIPITOR) 20 MG tablet Take 20 mg by mouth every Monday, Wednesday, and Friday.    . B Complex-C (SUPER B COMPLEX PO) Take 1 tablet by mouth daily.    . Cholecalciferol (VITAMIN D-3) 5000 UNITS TABS Take 5,000 Units by mouth daily.    . hydrochlorothiazide (HYDRODIURIL) 25 MG tablet Take 1 tablet (25 mg total) by mouth daily. 90 tablet 1  . Magnesium 250 MG TABS Take 250 mg by mouth daily.    . sertraline (ZOLOFT) 50 MG tablet Take 1 tablet (50 mg total) by mouth daily. 90 tablet 0   No current facility-administered medications on file prior to visit.    Medical History:  Past Medical History  Diagnosis Date  . Hyperlipidemia   . Vitamin D deficiency   . Allergic rhinitis   . Basal cell carcinoma     Allergies: No Known Allergies   Review of Systems:  Review of Systems  Constitutional: Negative for fever, chills and malaise/fatigue.  HENT: Negative for congestion, ear pain and sore throat.   Respiratory: Negative for cough, shortness of breath and wheezing.   Cardiovascular: Negative for chest pain, palpitations and leg swelling.  Gastrointestinal: Negative for heartburn, abdominal pain, diarrhea, constipation, blood in stool and melena.  Genitourinary: Negative.   Skin: Negative.   Neurological: Negative for headaches.    Family history- Review and unchanged  Social history- Review and unchanged  Physical Exam: BP 116/60 mmHg  Pulse 74  Temp(Src) 98.2  F (36.8 C) (Temporal)  Resp 16  Ht 5\' 3"  (1.6 m)  Wt 161 lb (73.029 kg)  BMI 28.53 kg/m2 Wt Readings from Last 3 Encounters:  11/08/15 161 lb (73.029 kg)  05/03/15 132 lb (59.875 kg)  04/24/14 149 lb (67.586 kg)    General Appearance: Well nourished well developed, in no apparent distress. Eyes: PERRLA, EOMs, conjunctiva no swelling or erythema ENT/Mouth: Ear canals normal without obstruction, swelling, erythma, discharge.  TMs normal bilaterally.  Oropharynx moist, clear, without exudate, or  postoropharyngeal swelling. Neck: Supple, thyroid normal,no cervical adenopathy  Respiratory: Respiratory effort normal, Breath sounds clear A&P without rhonchi, wheeze, or rale.  No retractions, no accessory usage. Cardio: RRR with no MRGs. Brisk peripheral pulses without edema.  Abdomen: Soft, + BS,  Non tender, no guarding, rebound, hernias, masses. Musculoskeletal: Full ROM, 5/5 strength, Normal gait Skin: Warm, dry without rashes, lesions, ecchymosis.  Neuro: Awake and oriented X 3, Cranial nerves intact. Normal muscle tone, no cerebellar symptoms. Psych: Normal affect, Insight and Judgment appropriate.    Starlyn Skeans, PA-C 9:24 AM [facility]

## 2015-11-14 ENCOUNTER — Encounter: Payer: Self-pay | Admitting: Internal Medicine

## 2015-11-14 ENCOUNTER — Ambulatory Visit: Payer: Self-pay | Admitting: Internal Medicine

## 2015-11-25 ENCOUNTER — Encounter: Payer: Self-pay | Admitting: *Deleted

## 2016-05-04 ENCOUNTER — Encounter: Payer: Self-pay | Admitting: Internal Medicine

## 2016-07-27 ENCOUNTER — Ambulatory Visit (INDEPENDENT_AMBULATORY_CARE_PROVIDER_SITE_OTHER): Payer: 59 | Admitting: Internal Medicine

## 2016-07-27 ENCOUNTER — Encounter: Payer: Self-pay | Admitting: Internal Medicine

## 2016-07-27 VITALS — BP 106/62 | HR 70 | Temp 98.2°F | Resp 16 | Ht 63.0 in | Wt 161.0 lb

## 2016-07-27 DIAGNOSIS — Z136 Encounter for screening for cardiovascular disorders: Secondary | ICD-10-CM

## 2016-07-27 DIAGNOSIS — Z0000 Encounter for general adult medical examination without abnormal findings: Secondary | ICD-10-CM

## 2016-07-27 DIAGNOSIS — Z1329 Encounter for screening for other suspected endocrine disorder: Secondary | ICD-10-CM

## 2016-07-27 DIAGNOSIS — Z1389 Encounter for screening for other disorder: Secondary | ICD-10-CM

## 2016-07-27 DIAGNOSIS — Z13220 Encounter for screening for lipoid disorders: Secondary | ICD-10-CM

## 2016-07-27 DIAGNOSIS — E559 Vitamin D deficiency, unspecified: Secondary | ICD-10-CM

## 2016-07-27 DIAGNOSIS — Z130 Encounter for screening for diseases of the blood and blood-forming organs and certain disorders involving the immune mechanism: Secondary | ICD-10-CM

## 2016-07-27 DIAGNOSIS — Z131 Encounter for screening for diabetes mellitus: Secondary | ICD-10-CM

## 2016-07-27 LAB — MICROALBUMIN / CREATININE URINE RATIO
Creatinine, Urine: 120 mg/dL (ref 20–320)
Microalb Creat Ratio: 10 ug/mg{creat}
Microalb, Ur: 1.2 mg/dL

## 2016-07-27 LAB — CBC WITH DIFFERENTIAL/PLATELET
BASOS ABS: 46 {cells}/uL (ref 0–200)
Basophils Relative: 1 %
EOS PCT: 3 %
Eosinophils Absolute: 138 cells/uL (ref 15–500)
HCT: 38 % (ref 35.0–45.0)
Hemoglobin: 12.9 g/dL (ref 11.7–15.5)
Lymphocytes Relative: 27 %
Lymphs Abs: 1242 cells/uL (ref 850–3900)
MCH: 32.2 pg (ref 27.0–33.0)
MCHC: 33.9 g/dL (ref 32.0–36.0)
MCV: 94.8 fL (ref 80.0–100.0)
MONOS PCT: 9 %
MPV: 10 fL (ref 7.5–12.5)
Monocytes Absolute: 414 cells/uL (ref 200–950)
NEUTROS ABS: 2760 {cells}/uL (ref 1500–7800)
Neutrophils Relative %: 60 %
PLATELETS: 213 10*3/uL (ref 140–400)
RBC: 4.01 MIL/uL (ref 3.80–5.10)
RDW: 12.3 % (ref 11.0–15.0)
WBC: 4.6 10*3/uL (ref 3.8–10.8)

## 2016-07-27 LAB — BASIC METABOLIC PANEL WITH GFR
BUN: 18 mg/dL (ref 7–25)
CALCIUM: 9.1 mg/dL (ref 8.6–10.4)
CHLORIDE: 105 mmol/L (ref 98–110)
CO2: 28 mmol/L (ref 20–31)
CREATININE: 1.05 mg/dL — AB (ref 0.50–0.99)
GFR, EST AFRICAN AMERICAN: 66 mL/min (ref >=60)
GFR, Est Non African American: 57 mL/min — ABNORMAL LOW (ref >=60)
Glucose, Bld: 86 mg/dL (ref 65–99)
Potassium: 4 mmol/L (ref 3.5–5.3)
SODIUM: 139 mmol/L (ref 135–146)

## 2016-07-27 LAB — HEPATIC FUNCTION PANEL
ALBUMIN: 3.9 g/dL (ref 3.6–5.1)
ALT: 9 U/L (ref 6–29)
AST: 14 U/L (ref 10–35)
Alkaline Phosphatase: 64 U/L (ref 33–130)
BILIRUBIN TOTAL: 0.4 mg/dL (ref 0.2–1.2)
Bilirubin, Direct: 0.1 mg/dL (ref <=0.2)
Indirect Bilirubin: 0.3 mg/dL (ref 0.2–1.2)
Total Protein: 7 g/dL (ref 6.1–8.1)

## 2016-07-27 LAB — URINALYSIS, MICROSCOPIC ONLY
Bacteria, UA: NONE SEEN [HPF]
Casts: NONE SEEN [LPF]
Crystals: NONE SEEN [HPF]
RBC / HPF: NONE SEEN RBC/HPF
Squamous Epithelial / HPF: NONE SEEN [HPF]
WBC, UA: NONE SEEN WBC/HPF
Yeast: NONE SEEN [HPF]

## 2016-07-27 LAB — LIPID PANEL
Cholesterol: 228 mg/dL — ABNORMAL HIGH (ref 125–200)
HDL: 79 mg/dL (ref >=46)
LDL Cholesterol: 141 mg/dL — ABNORMAL HIGH (ref <130)
Total CHOL/HDL Ratio: 2.9 Ratio (ref <=5.0)
Triglycerides: 41 mg/dL (ref <150)
VLDL: 8 mg/dL (ref <30)

## 2016-07-27 LAB — URINALYSIS, ROUTINE W REFLEX MICROSCOPIC
BILIRUBIN URINE: NEGATIVE
Glucose, UA: NEGATIVE
Ketones, ur: NEGATIVE
LEUKOCYTES UA: NEGATIVE
Nitrite: NEGATIVE
PH: 6 (ref 5.0–8.0)
Protein, ur: NEGATIVE
SPECIFIC GRAVITY, URINE: 1.015 (ref 1.001–1.035)

## 2016-07-27 LAB — MAGNESIUM: MAGNESIUM: 2.1 mg/dL (ref 1.5–2.5)

## 2016-07-27 LAB — IRON AND TIBC
%SAT: 21 % (ref 11–50)
IRON: 70 ug/dL (ref 45–160)
TIBC: 333 ug/dL (ref 250–450)
UIBC: 263 ug/dL (ref 125–400)

## 2016-07-27 LAB — HEMOGLOBIN A1C
HEMOGLOBIN A1C: 5 % (ref <5.7)
MEAN PLASMA GLUCOSE: 97 mg/dL

## 2016-07-27 LAB — TSH: TSH: 3.24 mIU/L

## 2016-07-27 LAB — VITAMIN B12: VITAMIN B 12: 491 pg/mL (ref 200–1100)

## 2016-07-27 NOTE — Progress Notes (Signed)
Complete Physical  Assessment and Plan:   1. Routine general medical examination at a health care facility  - CBC with Differential/Platelet - BASIC METABOLIC PANEL WITH GFR - Hepatic function panel - Magnesium  2. Screening for hyperlipidemia  - Lipid panel  3. Screening for diabetes mellitus  - Hemoglobin A1c - Insulin, random  4. Screening for deficiency anemia  - Iron and TIBC - Vitamin B12  5. Screening for hematuria or proteinuria  - Urinalysis, Routine w reflex microscopic (not at [facility]) - Microalbumin / creatinine urine ratio  6. Screening for cardiovascular condition  - EKG 12-Lead  7. Screening for thyroid disorder  - TSH  8. Vitamin D deficiency -cont Vit D - VITAMIN D 25 Hydroxy (Vit-D Deficiency, Fractures)   Discussed med's effects and SE's. Screening labs and tests as requested with regular follow-up as recommended.  HPI  60 y.o. female  presents for a complete physical.  Her blood pressure has been controlled at home, today their BP is BP: 106/62.  She does workout. She denies chest pain, shortness of breath, dizziness.   She reports that she is going to [employer] and using the treadmill there.    She is not on cholesterol medication and denies myalgias. Her cholesterol is at goal. The cholesterol last visit was:  Lab Results  Component Value Date   CHOL 200 11/08/2015   HDL 84 11/08/2015   LDLCALC 109 11/08/2015   TRIG 34 11/08/2015   CHOLHDL 2.4 11/08/2015  .  She has been working on diet and exercise for prediabetes, she is on bASA, she is not on ACE/ARB and denies foot ulcerations, hyperglycemia, hypoglycemia , increased appetite, nausea, paresthesia of the feet, polydipsia, polyuria, visual disturbances, vomiting and weight loss. Last A1C in the office was:  Lab Results  Component Value Date   HGBA1C 5.4 05/03/2015    Patient is on Vitamin D supplement.   Lab Results  Component Value Date   VD25OH 45 05/03/2015     She  is not currently taking any medications.  She has no complaints.  She reports that she is enjoying being retired.    She still sees Dr. Marvel Plan her Obgyn once yearly.  She is interested in the shingles shot but wants to speak with insurance prior to getting.    Current Medications:  No current outpatient prescriptions on file prior to visit.   No current facility-administered medications on file prior to visit.     Health Maintenance:   Immunization History  Administered Date(s) Administered  . Influenza Whole 09/02/2013  . Influenza, Seasonal, Injecte, Preservative Fre 11/08/2015  . PPD Test 04/24/2014  . Pneumococcal Polysaccharide-23 04/13/2013  . Tdap 10/16/2008    Tetanus: 2009 Pneumovax: 2014 Flu vaccine: 2016 Pap: 9/16 MGM: 8/17 DEXA: followed by Obgyn Colonoscopy: 2011 Last Dental Exam: Once yearly Last Eye Exam: Dr. Ronnald Ramp  Patient Care Team: Unk Pinto, MD as PCP - General (Internal Medicine) Viona Gilmore Evette Cristal, MD as Consulting Physician (Obstetrics and Gynecology) Leta Baptist, MD as Consulting Physician (Otolaryngology) Laurence Aly, OD (Optometry) Irene Shipper, MD as Consulting Physician (Gastroenterology) Signe Colt, MD as Referring Physician (Obstetrics and Gynecology) Danella Sensing, MD as Consulting Physician (Dermatology)  Allergies: No Known Allergies  Medical History:  Past Medical History:  Diagnosis Date  . Allergic rhinitis   . Basal cell carcinoma   . Hyperlipidemia   . Vitamin D deficiency     Surgical History:  Past Surgical History:  Procedure Laterality Date  .  TYMPANOPLASTY Left     Family History:  Family History  Problem Relation Age of Onset  . Hypertension Mother   . Heart disease Mother   . Hyperlipidemia Mother   . Heart attack Mother     Social History:  Social History  Substance Use Topics  . Smoking status: Never Smoker  . Smokeless tobacco: Not on file  . Alcohol use No    Review of Systems: Review of  Systems  Constitutional: Negative for chills, fever and malaise/fatigue.  HENT: Negative for congestion, ear pain and sore throat.   Eyes: Negative.   Respiratory: Negative for cough, shortness of breath and wheezing.   Cardiovascular: Negative for chest pain, palpitations and leg swelling.  Gastrointestinal: Negative for abdominal pain, blood in stool, constipation, diarrhea, heartburn and melena.  Genitourinary: Negative.   Skin: Negative.   Neurological: Negative for dizziness, sensory change, loss of consciousness and headaches.  Psychiatric/Behavioral: Negative for depression. The patient is not nervous/anxious and does not have insomnia.     Physical Exam: Estimated body mass index is 28.52 kg/m as calculated from the following:   Height as of this encounter: 5\' 3"  (1.6 m).   Weight as of this encounter: 161 lb (73 kg). BP 106/62   Pulse 70   Temp 98.2 F (36.8 C) (Temporal)   Resp 16   Ht 5\' 3"  (1.6 m)   Wt 161 lb (73 kg)   BMI 28.52 kg/m   General Appearance: Well nourished well developed, in no apparent distress.  Eyes: PERRLA, EOMs, conjunctiva no swelling or erythema ENT/Mouth: Ear canals normal without obstruction, swelling, erythema, or discharge.  TMs normal bilaterally with no erythema, bulging, retraction, or loss of landmark.  Oropharynx moist and clear with no exudate, erythema, or swelling.   Neck: Supple, thyroid normal. No bruits.  No cervical adenopathy Respiratory: Respiratory effort normal, Breath sounds clear A&P without wheeze, rhonchi, rales.   Cardio: RRR without murmurs, rubs or gallops. Brisk peripheral pulses without edema.  Chest: symmetric, with normal excursions Breasts: Symmetric, without lumps, nipple discharge, retractions.  Abdomen: Soft, nontender, no guarding, rebound, hernias, masses, or organomegaly.  Lymphatics: Non tender without lymphadenopathy.  Musculoskeletal: Full ROM all peripheral extremities,5/5 strength, and normal gait.   Skin: Warm, dry without rashes, lesions, ecchymosis. Neuro: Awake and oriented X 3, Cranial nerves intact, reflexes equal bilaterally. Normal muscle tone, no cerebellar symptoms. Sensation intact.  Psych:  normal affect, Insight and Judgment appropriate.   EKG: WNL no changes.  Over 40 minutes of exam, counseling, chart review and critical decision making was performed  Starlyn Skeans 9:24 AM [facility]

## 2016-07-28 LAB — VITAMIN D 25 HYDROXY (VIT D DEFICIENCY, FRACTURES): VIT D 25 HYDROXY: 35 ng/mL (ref 30–100)

## 2016-07-28 LAB — INSULIN, RANDOM: INSULIN: 3.2 u[IU]/mL (ref 2.0–19.6)

## 2016-07-29 ENCOUNTER — Ambulatory Visit: Payer: 59

## 2016-07-30 ENCOUNTER — Encounter: Payer: Self-pay | Admitting: Internal Medicine

## 2016-08-16 ENCOUNTER — Encounter: Payer: Self-pay | Admitting: Internal Medicine

## 2017-03-25 DIAGNOSIS — L237 Allergic contact dermatitis due to plants, except food: Secondary | ICD-10-CM | POA: Diagnosis not present

## 2017-04-14 ENCOUNTER — Encounter: Payer: Self-pay | Admitting: *Deleted

## 2017-05-10 DIAGNOSIS — H5711 Ocular pain, right eye: Secondary | ICD-10-CM | POA: Diagnosis not present

## 2017-07-14 LAB — HM MAMMOGRAPHY: HM Mammogram: NORMAL (ref 0–4)

## 2017-07-27 ENCOUNTER — Encounter: Payer: Self-pay | Admitting: Internal Medicine

## 2017-07-28 DIAGNOSIS — Z1231 Encounter for screening mammogram for malignant neoplasm of breast: Secondary | ICD-10-CM | POA: Diagnosis not present

## 2017-09-02 NOTE — Progress Notes (Signed)
Complete Physical  Assessment and Plan:  Essential hypertension - continue medications, DASH diet, exercise and monitor at home. Call if greater than 130/80.  -     CBC with Differential/Platelet -     BASIC METABOLIC PANEL WITH GFR -     Hepatic function panel -     TSH -     Urinalysis, Routine w reflex microscopic -     Microalbumin / creatinine urine ratio -     EKG 12-Lead  Hyperlipidemia, unspecified hyperlipidemia type -continue medications, check lipids, decrease fatty foods, increase activity.  -     Lipid panel  Allergic rhinitis, unspecified seasonality, unspecified trigger Continue OTC meds  Basal cell carcinoma (BCC), unspecified site Continue to monitor  Vitamin D deficiency -     VITAMIN D 25 Hydroxy (Vit-D Deficiency, Fractures)  Medication management -     Magnesium  Screening for deficiency anemia -     Iron,Total/Total Iron Binding Cap -     Vitamin B12  Routine general medical examination at a health care facility Will do as needed until welcome to medicare visit since patient is losing insurance.   Needs flu shot -     FLU VACCINE MDCK QUAD W/Preservative  Acute pain of left shoulder Rest, x 2 weeks mobic, then exercises, will put in referral to ortho Likely rotator cuff tendonitis, ? Partial tear -     meloxicam (MOBIC) 15 MG tablet; Take one daily with food for 2 weeks, can take with tylenol, can not take with aleve, iburpofen, then as needed daily for pain -     cyclobenzaprine (FLEXERIL) 10 MG tablet; Take 1 tablet (10 mg total) by mouth 3 (three) times daily as needed for muscle spasms. -     Ambulatory referral to Orthopedics  Screening for viral disease -     HIV antibody -     Hepatitis C antibody  Need for vaccine for DT (diphtheria-tetanus) -     Tdap vaccine greater than or equal to 7yo IM   Discussed med's effects and SE's. Screening labs and tests as requested with regular follow-up as recommended.  HPI  61 y.o. female   presents for a complete physical.  Her blood pressure has been controlled at home, today their BP is BP: 122/70.  She does workout. She denies chest pain, shortness of breath, dizziness.   She reports that she is going to [employer] and using the treadmill there.    Will lose insurance in Nov, then will start on Medicare.   Right handed, left arm pain. Was moving an elliptical from a truck, it fell to the right, she grabbed it and jerked her left arm and has been having pain x that time for 3 weeks. She also does water aerobics, no pain during it but pain at night. Using ibuprofen rarely, and bengay. She feels weakness in that arm, but no numbness/tingling.   She is not on cholesterol medication and denies myalgias. Her cholesterol is at goal. The cholesterol last visit was:  Lab Results  Component Value Date   CHOL 228 (H) 07/27/2016   HDL 79 07/27/2016   LDLCALC 141 (H) 07/27/2016   TRIG 41 07/27/2016   CHOLHDL 2.9 07/27/2016  . She has been working on diet and exercise for prediabetes, she is on bASA, she is not on ACE/ARB and denies foot ulcerations, hyperglycemia, hypoglycemia , increased appetite, nausea, paresthesia of the feet, polydipsia, polyuria, visual disturbances, vomiting and weight loss. Last A1C  in the office was:  Lab Results  Component Value Date   HGBA1C 5.0 07/27/2016   Patient is on Vitamin D supplement.   Lab Results  Component Value Date   VD25OH 35 07/27/2016      She still sees Dr. Marvel Plan her Obgyn once yearly.  BMI is Body mass index is 20.65 kg/m., she is working on diet and exercise. She is doing weight watchers, lost 50 lbs, exercising and doing great.  Wt Readings from Last 3 Encounters:  09/06/17 116 lb 9.6 oz (52.9 kg)  07/27/16 161 lb (73 kg)  11/08/15 161 lb (73 kg)     Current Medications:  No current outpatient prescriptions on file prior to visit.   No current facility-administered medications on file prior to visit.     Health  Maintenance:   Immunization History  Administered Date(s) Administered  . Influenza Inj Mdck Quad With Preservative 09/06/2017  . Influenza Whole 09/02/2013  . Influenza, Seasonal, Injecte, Preservative Fre 11/08/2015  . Influenza-Unspecified 07/29/2016  . PPD Test 04/24/2014  . Pneumococcal Polysaccharide-23 04/13/2013  . Tdap 10/16/2008   Tetanus: 2009 TODAY Pneumovax: 2014 Flu vaccine: 2018 Pap: 07/2017 MGM: 07/2017 DEXA: followed by Obgyn Colonoscopy: 2011 Last Dental Exam: Once yearly Last Eye Exam: Dr. Ronnald Ramp  Patient Care Team: Unk Pinto, MD as PCP - General (Internal Medicine) Maisie Fus, MD as Consulting Physician (Obstetrics and Gynecology) Leta Baptist, MD as Consulting Physician (Otolaryngology) Laurence Aly, OD (Optometry) Irene Shipper, MD as Consulting Physician (Gastroenterology) Signe Colt, MD as Referring Physician (Obstetrics and Gynecology) Danella Sensing, MD as Consulting Physician (Dermatology)  Medical History:  Past Medical History:  Diagnosis Date  . Allergic rhinitis   . Basal cell carcinoma   . Hyperlipidemia   . Vitamin D deficiency    Allergies No Known Allergies  SURGICAL HISTORY She  has a past surgical history that includes Tympanoplasty (Left). FAMILY HISTORY Her family history includes Heart attack in her mother; Heart disease in her mother; Hyperlipidemia in her mother; Hypertension in her mother. SOCIAL HISTORY She  reports that she has never smoked. She has never used smokeless tobacco. She reports that she does not drink alcohol or use drugs.   Review of Systems: Review of Systems  Constitutional: Negative for chills, fever and malaise/fatigue.  HENT: Negative for congestion, ear pain and sore throat.   Eyes: Negative.   Respiratory: Negative for cough, shortness of breath and wheezing.   Cardiovascular: Negative for chest pain, palpitations and leg swelling.  Gastrointestinal: Negative for abdominal pain, blood  in stool, constipation, diarrhea, heartburn and melena.  Genitourinary: Negative.   Musculoskeletal: Positive for myalgias (left shoulder). Negative for back pain, falls, joint pain and neck pain.  Skin: Negative.   Neurological: Negative for dizziness, sensory change, loss of consciousness and headaches.  Psychiatric/Behavioral: Negative for depression. The patient is not nervous/anxious and does not have insomnia.     Physical Exam: Estimated body mass index is 20.65 kg/m as calculated from the following:   Height as of this encounter: 5\' 3"  (1.6 m).   Weight as of this encounter: 116 lb 9.6 oz (52.9 kg). BP 122/70   Pulse 80   Temp (!) 97.3 F (36.3 C)   Resp 14   Ht 5\' 3"  (1.6 m)   Wt 116 lb 9.6 oz (52.9 kg)   SpO2 98%   BMI 20.65 kg/m   General Appearance: Well nourished well developed, in no apparent distress.  Eyes: PERRLA, EOMs, conjunctiva  no swelling or erythema ENT/Mouth: Ear canals normal without obstruction, swelling, erythema, or discharge.  TMs normal bilaterally with no erythema, bulging, retraction, or loss of landmark.  Oropharynx moist and clear with no exudate, erythema, or swelling.   Neck: Supple, thyroid normal. No bruits.  No cervical adenopathy Respiratory: Respiratory effort normal, Breath sounds clear A&P without wheeze, rhonchi, rales.   Cardio: RRR without murmurs, rubs or gallops. Brisk peripheral pulses without edema.  Chest: symmetric, with normal excursions Breasts: Symmetric, without lumps, nipple discharge, retractions.  Abdomen: Soft, nontender, no guarding, rebound, hernias, masses, or organomegaly.  Lymphatics: Non tender without lymphadenopathy.  Musculoskeletal: Full ROM all peripheral extremities,5/5 strength, and normal gait. No deformity left shoulder, + pain lateral shoulder, + pain with empty can test, + pain with abduction to 90 degrees, limited by pain to 100-120 degrees.  Skin: Warm, dry without rashes, lesions, ecchymosis. Neuro:  Awake and oriented X 3, Cranial nerves intact, reflexes equal bilaterally. Normal muscle tone, no cerebellar symptoms. Sensation intact.  Psych:  normal affect, Insight and Judgment appropriate.   EKG: WNL no changes.  Over 40 minutes of exam, counseling, chart review and critical decision making was performed  Vicie Mutters 9:17 AM [facility]

## 2017-09-06 ENCOUNTER — Ambulatory Visit (INDEPENDENT_AMBULATORY_CARE_PROVIDER_SITE_OTHER): Payer: 59 | Admitting: Physician Assistant

## 2017-09-06 ENCOUNTER — Encounter: Payer: Self-pay | Admitting: Physician Assistant

## 2017-09-06 VITALS — BP 122/70 | HR 80 | Temp 97.3°F | Resp 14 | Ht 63.0 in | Wt 116.6 lb

## 2017-09-06 DIAGNOSIS — J309 Allergic rhinitis, unspecified: Secondary | ICD-10-CM

## 2017-09-06 DIAGNOSIS — I10 Essential (primary) hypertension: Secondary | ICD-10-CM

## 2017-09-06 DIAGNOSIS — Z0001 Encounter for general adult medical examination with abnormal findings: Secondary | ICD-10-CM

## 2017-09-06 DIAGNOSIS — E559 Vitamin D deficiency, unspecified: Secondary | ICD-10-CM

## 2017-09-06 DIAGNOSIS — C4491 Basal cell carcinoma of skin, unspecified: Secondary | ICD-10-CM

## 2017-09-06 DIAGNOSIS — Z136 Encounter for screening for cardiovascular disorders: Secondary | ICD-10-CM

## 2017-09-06 DIAGNOSIS — E785 Hyperlipidemia, unspecified: Secondary | ICD-10-CM | POA: Diagnosis not present

## 2017-09-06 DIAGNOSIS — M25512 Pain in left shoulder: Secondary | ICD-10-CM

## 2017-09-06 DIAGNOSIS — Z0000 Encounter for general adult medical examination without abnormal findings: Secondary | ICD-10-CM

## 2017-09-06 DIAGNOSIS — R6889 Other general symptoms and signs: Secondary | ICD-10-CM

## 2017-09-06 DIAGNOSIS — Z23 Encounter for immunization: Secondary | ICD-10-CM

## 2017-09-06 DIAGNOSIS — Z130 Encounter for screening for diseases of the blood and blood-forming organs and certain disorders involving the immune mechanism: Secondary | ICD-10-CM

## 2017-09-06 DIAGNOSIS — Z79899 Other long term (current) drug therapy: Secondary | ICD-10-CM

## 2017-09-06 DIAGNOSIS — Z1159 Encounter for screening for other viral diseases: Secondary | ICD-10-CM

## 2017-09-06 MED ORDER — CYCLOBENZAPRINE HCL 10 MG PO TABS: 10.0000 mg | ORAL_TABLET | Freq: Three times a day (TID) | ORAL | 0 refills | Status: DC | PRN

## 2017-09-06 MED ORDER — MELOXICAM 15 MG PO TABS: ORAL_TABLET | ORAL | 1 refills | Status: DC

## 2017-09-06 NOTE — Patient Instructions (Signed)
Mobic is an antiinflammatory It helps pain, can not take with aleve, or ibuprofen You can take tylenol (500mg ) or tylenol arthritis (650mg ) with the meloxicam/antiinflammatories. The max you can take of tylenol a day is 3000mg  daily, this is a max of 6 pills a day of the regular tyelnol (500mg ) or a max of 4 a day of the tylenol arthritis (650mg ) as long as no other medications you are taking contain tylenol.   Mobic can cause inflammation in your stomach and can cause ulcers or bleeding, this will look like black tarry stools Make sure you take your mobic with food Try not to take it daily, take AS needed Can take with zantac   Rotator Cuff Tendinitis Rotator cuff tendinitis is inflammation of the tough, cord-like bands that connect muscle to bone (tendons) in the rotator cuff. The rotator cuff includes all of the muscles and tendons that connect the arm to the shoulder. The rotator cuff holds the head of the upper arm bone (humerus) in the cup (fossa) of the shoulder blade (scapula). This condition can lead to a long-lasting (chronic) tear. The tear may be partial or complete. What are the causes? This condition is usually caused by overusing the rotator cuff. What increases the risk? This condition is more likely to develop in athletes and workers who frequently use their shoulder or reach over their heads. This can include activities such as:  Tennis.  Baseball or softball.  Swimming.  Construction work.  Painting.  What are the signs or symptoms? Symptoms of this condition include:  Pain spreading (radiating) from the shoulder to the upper arm.  Swelling and tenderness in front of the shoulder.  Pain when reaching, pulling, or lifting the arm above the head.  Pain when lowering the arm from above the head.  Minor pain in the shoulder when resting.  Increased pain in the shoulder at night.  Difficulty placing the arm behind the back.  How is this diagnosed? This  condition is diagnosed with a medical history and physical exam. Tests may also be done, including:  X-rays.  MRI.  Ultrasounds.  CT or MR arthrogram. During this test, a contrast material is injected and then images are taken.  How is this treated? Treatment for this condition depends on the severity of the condition. In less severe cases, treatment may include:  Rest. This may be done with a sling that holds the shoulder still (immobilization). Your health care provider may also recommend avoiding activities that involve lifting your arm over your head.  Icing the shoulder.  Anti-inflammatory medicines, such as aspirin or ibuprofen.  In more severe cases, treatment may include:  Physical therapy.  Steroid injections.  Surgery.  Follow these instructions at home: If you have a sling:  Wear the sling as told by your health care provider. Remove it only as told by your health care provider.  Loosen the sling if your fingers tingle, become numb, or turn cold and blue.  Keep the sling clean.  If the sling is not waterproof, do not let it get wet. Remove it, if allowed, or cover it with a watertight covering when you take a bath or shower. Managing pain, stiffness, and swelling  If directed, put ice on the injured area. ? If you have a removable sling, remove it as told by your health care provider. ? Put ice in a plastic bag. ? Place a towel between your skin and the bag. ? Leave the ice on for 20  minutes, 2-3 times a day.  Move your fingers often to avoid stiffness and to lessen swelling.  Raise (elevate) the injured area above the level of your heart while you are lying down.  Find a comfortable sleeping position or sleep on a recliner, if available. Driving  Do not drive or use heavy machinery while taking prescription pain medicine.  Ask your health care provider when it is safe to drive if you have a sling on your arm. Activity  Rest your shoulder as told by  your health care provider.  Return to your normal activities as told by your health care provider. Ask your health care provider what activities are safe for you.  Do any exercises or stretches as told by your health care provider.  If you do repetitive overhead tasks, take small breaks in between and include stretching exercises as told by your health care provider. General instructions  Do not use any products that contain nicotine or tobacco, such as cigarettes and e-cigarettes. These can delay healing. If you need help quitting, ask your health care provider.  Take over-the-counter and prescription medicines only as told by your health care provider.  Keep all follow-up visits as told by your health care provider. This is important. Contact a health care provider if:  Your pain gets worse.  You have new pain in your arm, hands, or fingers.  Your pain is not relieved with medicine or does not get better after 6 weeks of treatment.  You have cracking sensations when moving your shoulder in certain directions.  You hear a snapping sound after using your shoulder, followed by severe pain and weakness. Get help right away if:  Your arm, hand, or fingers are numb or tingling.  Your arm, hand, or fingers are swollen or painful or they turn white or blue. Summary  Rotator cuff tendinitis is inflammation of the tough, cord-like bands that connect muscle to bone (tendons) in the rotator cuff.  This condition is usually caused by overusing the rotator cuff, which includes all of the muscles and tendons that connect the arm to the shoulder.  This condition is more likely to develop in athletes and workers who frequently use their shoulder or reach over their heads.  Treatment generally includes rest, anti-inflammatory medicines, and icing. In some cases, physical therapy and steroid injections may be needed. In severe cases, surgery may be needed. This information is not intended to  replace advice given to you by your health care provider. Make sure you discuss any questions you have with your health care provider. Document Released: 02/06/2004 Document Revised: 11/02/2016 Document Reviewed: 11/02/2016 Elsevier Interactive Patient Education  2017 [employer].   Shoulder Impingement Syndrome Rehab Ask your health care provider which exercises are safe for you. Do exercises exactly as told by your health care provider and adjust them as directed. It is normal to feel mild stretching, pulling, tightness, or discomfort as you do these exercises, but you should stop right away if you feel sudden pain or your pain gets worse.Do not begin these exercises until told by your health care provider. Stretching and range of motion exercise This exercise warms up your muscles and joints and improves the movement and flexibility of your shoulder. This exercise also helps to relieve pain and stiffness. Exercise A: Passive horizontal adduction  1. Sit or stand and pull your left / right elbow across your chest, toward your other shoulder. Stop when you feel a gentle stretch in the back of  your shoulder and upper arm. ? Keep your arm at shoulder height. ? Keep your arm as close to your body as you comfortably can. 2. Hold for __________ seconds. 3. Slowly return to the starting position. Repeat __________ times. Complete this exercise __________ times a day. Strengthening exercises These exercises build strength and endurance in your shoulder. Endurance is the ability to use your muscles for a long time, even after they get tired. Exercise B: External rotation, isometric 1. Stand or sit in a doorway, facing the door frame. 2. Bend your left / right elbow and place the back of your wrist against the door frame. Only your wrist should be touching the frame. Keep your upper arm at your side. 3. Gently press your wrist against the door frame, as if you are trying to push your arm away from  your abdomen. ? Avoid shrugging your shoulder while you press your hand against the door frame. Keep your shoulder blade tucked down toward the middle of your back. 4. Hold for __________ seconds. 5. Slowly release the tension, and relax your muscles completely before you do the exercise again. Repeat __________ times. Complete this exercise __________ times a day. Exercise C: Internal rotation, isometric  1. Stand or sit in a doorway, facing the door frame. 2. Bend your left / right elbow and place the inside of your wrist against the door frame. Only your wrist should be touching the frame. Keep your upper arm at your side. 3. Gently press your wrist against the door frame, as if you are trying to push your arm toward your abdomen. ? Avoid shrugging your shoulder while you press your hand against the door frame. Keep your shoulder blade tucked down toward the middle of your back. 4. Hold for __________ seconds. 5. Slowly release the tension, and relax your muscles completely before you do the exercise again. Repeat __________ times. Complete this exercise __________ times a day. Exercise D: Scapular protraction, supine  1. Lie on your back on a firm surface. Hold a __________ weight in your left / right hand. 2. Raise your left / right arm straight into the air so your hand is directly above your shoulder joint. 3. Push the weight into the air so your shoulder lifts off of the surface that you are lying on. Do not move your head, neck, or back. 4. Hold for __________ seconds. 5. Slowly return to the starting position. Let your muscles relax completely before you repeat this exercise. Repeat __________ times. Complete this exercise __________ times a day. Exercise E: Scapular retraction  1. Sit in a stable chair without armrests, or stand. 2. Secure an exercise band to a stable object in front of you so the band is at shoulder height. 3. Hold one end of the exercise band in each hand. Your  palms should face down. 4. Squeeze your shoulder blades together and move your elbows slightly behind you. Do not shrug your shoulders while you do this. 5. Hold for __________ seconds. 6. Slowly return to the starting position. Repeat __________ times. Complete this exercise __________ times a day. Exercise F: Shoulder extension  1. Sit in a stable chair without armrests, or stand. 2. Secure an exercise band to a stable object in front of you where the band is above shoulder height. 3. Hold one end of the exercise band in each hand. 4. Straighten your elbows and lift your hands up to shoulder height. 5. Squeeze your shoulder blades together and pull your  hands down to the sides of your thighs. Stop when your hands are straight down by your sides. Do not let your hands go behind your body. 6. Hold for __________ seconds. 7. Slowly return to the starting position. Repeat __________ times. Complete this exercise __________ times a day. This information is not intended to replace advice given to you by your health care provider. Make sure you discuss any questions you have with your health care provider. Document Released: 11/16/2005 Document Revised: 07/23/2016 Document Reviewed: 10/19/2015 Elsevier Interactive Patient Education  [employer].

## 2017-09-07 ENCOUNTER — Other Ambulatory Visit: Payer: Self-pay | Admitting: Physician Assistant

## 2017-09-07 ENCOUNTER — Encounter: Payer: Self-pay | Admitting: Physician Assistant

## 2017-09-07 DIAGNOSIS — R7989 Other specified abnormal findings of blood chemistry: Secondary | ICD-10-CM

## 2017-09-07 LAB — LIPID PANEL
CHOL/HDL RATIO: 3.7 (calc) (ref <5.0)
CHOLESTEROL: 233 mg/dL — AB (ref <200)
HDL: 63 mg/dL (ref >50)
LDL CHOLESTEROL (CALC): 154 mg/dL — AB
Non-HDL Cholesterol (Calc): 170 mg/dL (calc) — ABNORMAL HIGH (ref <130)
Triglycerides: 65 mg/dL (ref <150)

## 2017-09-07 LAB — HEPATITIS C ANTIBODY
Hepatitis C Ab: NONREACTIVE
SIGNAL TO CUT-OFF: 0.02 (ref <1.00)

## 2017-09-07 LAB — BASIC METABOLIC PANEL WITH GFR
BUN: 9 mg/dL (ref 7–25)
CALCIUM: 9.2 mg/dL (ref 8.6–10.4)
CHLORIDE: 105 mmol/L (ref 98–110)
CO2: 27 mmol/L (ref 20–32)
CREATININE: 0.86 mg/dL (ref 0.50–0.99)
GFR, Est African American: 83 mL/min/{1.73_m2} (ref >=60)
GFR, Est Non African American: 72 mL/min/{1.73_m2} (ref >=60)
GLUCOSE: 79 mg/dL (ref 65–99)
Potassium: 4 mmol/L (ref 3.5–5.3)
Sodium: 139 mmol/L (ref 135–146)

## 2017-09-07 LAB — TSH: TSH: 5.03 m[IU]/L — AB (ref 0.40–4.50)

## 2017-09-07 LAB — HEPATIC FUNCTION PANEL
AG RATIO: 1.4 (calc) (ref 1.0–2.5)
ALBUMIN MSPROF: 4 g/dL (ref 3.6–5.1)
ALT: 8 U/L (ref 6–29)
AST: 13 U/L (ref 10–35)
Alkaline phosphatase (APISO): 54 U/L (ref 33–130)
Bilirubin, Direct: 0.1 mg/dL (ref 0.0–0.2)
GLOBULIN: 2.9 g/dL (ref 1.9–3.7)
Indirect Bilirubin: 0.5 mg/dL (calc) (ref 0.2–1.2)
TOTAL PROTEIN: 6.9 g/dL (ref 6.1–8.1)
Total Bilirubin: 0.6 mg/dL (ref 0.2–1.2)

## 2017-09-07 LAB — CBC WITH DIFFERENTIAL/PLATELET
BASOS PCT: 0.9 %
Basophils Absolute: 41 cells/uL (ref 0–200)
EOS ABS: 129 {cells}/uL (ref 15–500)
EOS PCT: 2.8 %
HCT: 36.6 % (ref 35.0–45.0)
HEMOGLOBIN: 12.5 g/dL (ref 11.7–15.5)
Lymphs Abs: 1228 cells/uL (ref 850–3900)
MCH: 32.5 pg (ref 27.0–33.0)
MCHC: 34.2 g/dL (ref 32.0–36.0)
MCV: 95.1 fL (ref 80.0–100.0)
MONOS PCT: 6.7 %
MPV: 11.4 fL (ref 7.5–12.5)
NEUTROS ABS: 2893 {cells}/uL (ref 1500–7800)
Neutrophils Relative %: 62.9 %
PLATELETS: 182 10*3/uL (ref 140–400)
RBC: 3.85 10*6/uL (ref 3.80–5.10)
RDW: 11.7 % (ref 11.0–15.0)
Total Lymphocyte: 26.7 %
WBC mixed population: 308 cells/uL (ref 200–950)
WBC: 4.6 10*3/uL (ref 3.8–10.8)

## 2017-09-07 LAB — IRON, TOTAL/TOTAL IRON BINDING CAP
%SAT: 27 % (calc) (ref 11–50)
IRON: 86 ug/dL (ref 45–160)
TIBC: 316 ug/dL (ref 250–450)

## 2017-09-07 LAB — URINALYSIS, ROUTINE W REFLEX MICROSCOPIC
BILIRUBIN URINE: NEGATIVE
Glucose, UA: NEGATIVE
Hgb urine dipstick: NEGATIVE
KETONES UR: NEGATIVE
Leukocytes, UA: NEGATIVE
NITRITE: NEGATIVE
PH: 6 (ref 5.0–8.0)
Protein, ur: NEGATIVE
SPECIFIC GRAVITY, URINE: 1.013 (ref 1.001–1.03)

## 2017-09-07 LAB — VITAMIN D 25 HYDROXY (VIT D DEFICIENCY, FRACTURES): Vit D, 25-Hydroxy: 31 ng/mL (ref 30–100)

## 2017-09-07 LAB — HIV ANTIBODY (ROUTINE TESTING W REFLEX): HIV 1&2 Ab, 4th Generation: NONREACTIVE

## 2017-09-07 LAB — MICROALBUMIN / CREATININE URINE RATIO
Creatinine, Urine: 67 mg/dL (ref 20–275)
MICROALB UR: 0.2 mg/dL
Microalb Creat Ratio: 3 mcg/mg creat (ref <30)

## 2017-09-07 LAB — MAGNESIUM: Magnesium: 2.2 mg/dL (ref 1.5–2.5)

## 2017-09-07 LAB — VITAMIN B12: Vitamin B-12: 320 pg/mL (ref 200–1100)

## 2017-09-08 ENCOUNTER — Other Ambulatory Visit: Payer: Self-pay | Admitting: Physician Assistant

## 2017-09-08 MED ORDER — ROSUVASTATIN CALCIUM 10 MG PO TABS: 10.0000 mg | ORAL_TABLET | Freq: Every day | ORAL | 0 refills | Status: DC

## 2017-09-08 NOTE — Progress Notes (Unsigned)
Future Appointments Date Time Provider [place]  09/12/2018 9:00 AM Vicie Mutters, PA-C [id number] None

## 2017-09-14 ENCOUNTER — Encounter: Payer: Self-pay | Admitting: Physician Assistant

## 2017-10-06 ENCOUNTER — Ambulatory Visit (INDEPENDENT_AMBULATORY_CARE_PROVIDER_SITE_OTHER): Payer: 59 | Admitting: Orthopedic Surgery

## 2017-10-06 ENCOUNTER — Encounter (INDEPENDENT_AMBULATORY_CARE_PROVIDER_SITE_OTHER): Payer: Self-pay | Admitting: Orthopedic Surgery

## 2017-10-06 ENCOUNTER — Ambulatory Visit (INDEPENDENT_AMBULATORY_CARE_PROVIDER_SITE_OTHER): Payer: 59

## 2017-10-06 DIAGNOSIS — M25512 Pain in left shoulder: Secondary | ICD-10-CM | POA: Diagnosis not present

## 2017-10-06 DIAGNOSIS — G8929 Other chronic pain: Secondary | ICD-10-CM

## 2017-10-06 DIAGNOSIS — M7552 Bursitis of left shoulder: Secondary | ICD-10-CM

## 2017-10-06 MED ORDER — BUPIVACAINE HCL 0.5 % IJ SOLN
9.0000 mL | INTRAMUSCULAR | Status: AC | PRN
  Administered 2017-10-06: 9 mL via INTRA_ARTICULAR

## 2017-10-06 MED ORDER — METHYLPREDNISOLONE ACETATE 40 MG/ML IJ SUSP
40.0000 mg | INTRAMUSCULAR | Status: AC | PRN
  Administered 2017-10-06: 40 mg via INTRA_ARTICULAR

## 2017-10-06 MED ORDER — LIDOCAINE HCL 1 % IJ SOLN
5.0000 mL | INTRAMUSCULAR | Status: AC | PRN
  Administered 2017-10-06: 5 mL

## 2017-10-06 NOTE — Progress Notes (Signed)
Office Visit Note   Patient: Tracy Brady           Date of Birth: [birth year]           MRN: [id number] Visit Date: 10/06/2017 Requested by: Vicie Mutters, PA-C [address] [place], [place] [address] PCP: Unk Pinto, MD  Subjective: Chief Complaint  Patient presents with  . Left Shoulder - Pain    HPI: Tracy Brady is a 61 year old patient with left arm and shoulder pain.  Pain has been going on for 6 weeks since she injured it trying to help move something from a truck.  She is right-hand dominant.  Psych getting any better.  She reports decreased range of motion and some weakness.  The pain will wake her from sleep at times.  She is unable to lay on the left side.  She takes Flexeril and Mobic which is helping.  She enjoys doing water aerobics.  She had no problem with the left shoulder prior to this injury which she sustained when trying to lift something.  f              ROS: All systems reviewed are negative as they relate to the chief complaint within the history of present illness.  Patient denies  fevers or chills.   Assessment & Plan: Visit Diagnoses:  1. Chronic left shoulder pain     Plan: Impression is left shoulder pain with asymmetry on exam consistent with either partial-thickness or small full-thickness rotator cuff tear.  Plan is subacromial injection with return office visit in January.  We could consider further imaging at that time if she remains symptomatic.  I do want her to start back with water aerobics next weeks and we can find out if this is going to be a problem for her.  Follow-Up Instructions: Return in about 8 weeks (around 12/01/2017).   Orders:  Orders Placed This Encounter  Procedures  . XR Shoulder Left   No orders of the defined types were placed in this encounter.     Procedures: Large Joint Inj: L subacromial bursa on 10/06/2017 11:02 AM Indications: diagnostic evaluation and pain Details: 18 G 1.5 in needle, posterior  approach  Arthrogram: No  Medications: 9 mL bupivacaine 0.5 %; 40 mg methylPREDNISolone acetate 40 MG/ML; 5 mL lidocaine 1 % Outcome: tolerated well, no immediate complications Procedure, treatment alternatives, risks and benefits explained, specific risks discussed. Consent was given by the patient. Immediately prior to procedure a time out was called to verify the correct patient, procedure, equipment, support staff and site/side marked as required. Patient was prepped and draped in the usual sterile fashion.       Clinical Data: No additional findings.  Objective: Vital Signs: There were no vitals taken for this visit.  Physical Exam:   Constitutional: Patient appears well-developed HEENT:  Head: Normocephalic Eyes:EOM are normal Neck: Normal range of motion Cardiovascular: Normal rate Pulmonary/chest: Effort normal Neurologic: Patient is alert Skin: Skin is warm Psychiatric: Patient has normal mood and affect    Ortho Exam: Orthopedic exam demonstrates good cervical spine range of motion.  5 out of 5 grip EPL FPL interosseous wrist flexion and wrist extension biceps triceps and deltoid strength.  She does have a little bit of coarseness on the left with passive rotation compared to the right.  Slight weakness to super status testing left versus right but this is very subtle.  O'Brien's testing negative on the left and right hand side.  No  discrete acromioclavicular joint tenderness on either shoulder.  No paresthesias C5-T1.  No other masses lymph adenopathy or skin changes noted in the arm and shoulder girdle region.  Specialty Comments:  No specialty comments available.  Imaging: Xr Shoulder Left  Result Date: 10/06/2017 AP lateral and outlet of left shoulder reviewed.  The visualized lung fields normal.  Shoulder is located.  No fractures present.  Minimal acromioclavicular joint degenerative changes present    PMFS History: Patient Active Problem List    Diagnosis Date Noted  . HTN (hypertension) 11/08/2015  . Hyperlipidemia   . Vitamin D deficiency   . Allergic rhinitis   . Basal cell carcinoma    Past Medical History:  Diagnosis Date  . Allergic rhinitis   . Basal cell carcinoma   . Hyperlipidemia   . Vitamin D deficiency     Family History  Problem Relation Age of Onset  . Hypertension Mother   . Heart disease Mother   . Hyperlipidemia Mother   . Heart attack Mother     Past Surgical History:  Procedure Laterality Date  . TYMPANOPLASTY Left    Social History   Occupational History  . Not on file  Tobacco Use  . Smoking status: Never Smoker  . Smokeless tobacco: Never Used  Substance and Sexual Activity  . Alcohol use: No  . Drug use: No  . Sexual activity: Not on file

## 2017-10-07 ENCOUNTER — Other Ambulatory Visit: Payer: 59

## 2017-10-07 DIAGNOSIS — R7989 Other specified abnormal findings of blood chemistry: Secondary | ICD-10-CM

## 2017-10-07 LAB — TSH: TSH: 1.92 m[IU]/L (ref 0.40–4.50)

## 2017-10-11 ENCOUNTER — Other Ambulatory Visit: Payer: Self-pay | Admitting: Physician Assistant

## 2017-10-11 ENCOUNTER — Encounter: Payer: Self-pay | Admitting: Physician Assistant

## 2017-10-11 DIAGNOSIS — Z01419 Encounter for gynecological examination (general) (routine) without abnormal findings: Secondary | ICD-10-CM | POA: Diagnosis not present

## 2017-10-14 ENCOUNTER — Other Ambulatory Visit: Payer: Self-pay | Admitting: Physician Assistant

## 2017-10-15 MED ORDER — ROSUVASTATIN CALCIUM 10 MG PO TABS: 10.0000 mg | ORAL_TABLET | Freq: Every day | ORAL | 1 refills | Status: DC

## 2017-11-06 ENCOUNTER — Other Ambulatory Visit: Payer: Self-pay | Admitting: Physician Assistant

## 2017-11-06 DIAGNOSIS — M25512 Pain in left shoulder: Secondary | ICD-10-CM

## 2017-11-08 ENCOUNTER — Encounter: Payer: Self-pay | Admitting: Physician Assistant

## 2017-11-08 DIAGNOSIS — M25512 Pain in left shoulder: Secondary | ICD-10-CM

## 2017-11-09 MED ORDER — CYCLOBENZAPRINE HCL 10 MG PO TABS: 10.0000 mg | ORAL_TABLET | Freq: Three times a day (TID) | ORAL | 0 refills | Status: DC | PRN

## 2017-12-08 ENCOUNTER — Ambulatory Visit (INDEPENDENT_AMBULATORY_CARE_PROVIDER_SITE_OTHER): Payer: 59 | Admitting: Orthopedic Surgery

## 2018-02-10 ENCOUNTER — Ambulatory Visit: Payer: 59 | Admitting: Adult Health

## 2018-02-10 ENCOUNTER — Encounter: Payer: Self-pay | Admitting: Adult Health

## 2018-02-10 VITALS — BP 122/70 | HR 81 | Temp 97.8°F | Resp 16 | Ht 63.0 in | Wt 140.6 lb

## 2018-02-10 DIAGNOSIS — R110 Nausea: Secondary | ICD-10-CM

## 2018-02-10 DIAGNOSIS — R1013 Epigastric pain: Secondary | ICD-10-CM

## 2018-02-10 MED ORDER — SUCRALFATE 1 G PO TABS: 1.0000 g | ORAL_TABLET | Freq: Four times a day (QID) | ORAL | 1 refills | Status: DC

## 2018-02-10 MED ORDER — METOCLOPRAMIDE HCL 5 MG PO TABS: 5.0000 mg | ORAL_TABLET | Freq: Three times a day (TID) | ORAL | 0 refills | Status: DC | PRN

## 2018-02-10 MED ORDER — OMEPRAZOLE 40 MG PO CPDR: 40.0000 mg | DELAYED_RELEASE_CAPSULE | Freq: Every day | ORAL | 1 refills | Status: DC

## 2018-02-10 NOTE — Patient Instructions (Signed)
Omeprazole 40 mg - 1 tab daily (proton pump inhibitor for acid reduction)  Carafate - 1 g tab - can take up to 4 times a day as needed to help coat stomach  Reglan 5 mg up to 3 times a day as needed for nausea  Avoid bananas, mint, spicy foods/caffeine, very fatty foods-   Peptic Ulcer A peptic ulcer is a sore in the lining of the esophagus (esophageal ulcer), the stomach (gastric ulcer), or the first part of the small intestine (duodenal ulcer). The ulcer causes gradual wearing away (erosion) into the deeper tissue. What are the causes? Normally, the lining of the stomach and the small intestine protects itself from the acid that digests food. The protective lining can be damaged by:  An infection caused by a germ (bacterium) called Helicobacter pylori or H. pylori.  Regular use of NSAIDs, such as ibuprofen or aspirin.  Rare tumors in the stomach, small intestine, or pancreas (Zollinger-Ellison syndrome).  What increases the risk? The following factors may make you more likely to develop this condition:  Smoking.  Having a family history of ulcer disease.  What are the signs or symptoms? Symptoms of this condition include:  Burning pain or gnawing in the area between the chest and the belly button. The pain may be worse on an empty stomach and at night.  Heartburn.  Nausea and vomiting.  Bloating.  If the ulcer results in bleeding, it can cause:  Black, tarry stools.  Vomiting of bright red blood.  Vomiting of material that looks like coffee grounds.  How is this diagnosed? This condition may be diagnosed based on:  Medical history and physical exam.  Various tests or procedures, such as: ? Blood tests, stool tests, or breath tests to check for the H. pylori bacterium. ? An X-ray exam (upper gastrointestinal series) of the esophagus, stomach, and small intestine. ? Upper endoscopy. The health care provider examines the esophagus, stomach, and small intestine  using a small flexible tube that has a video camera at the end. ? Biopsy. A tissue sample is removed to be examined under a microscope.  How is this treated? Treatment for this condition may include:  Eliminating the cause of the ulcer, such as smoking or the use of NSAIDs or alcohol.  Medicines to reduce the amount of acid in your digestive tract.  Antibiotic medicines, if the ulcer is caused by the H. pylori bacterium.  An upper endoscopy to treat a bleeding ulcer.  Surgery, if the bleeding is severe or if the ulcer created a hole somewhere in the digestive system.  Follow these instructions at home:  Avoid alcohol and caffeine.  Do not use any tobacco products, such as cigarettes, chewing tobacco, and e-cigarettes. If you need help quitting, ask your health care provider.  Take over-the-counter and prescription medicines only as told by your health care provider. Do not use over-the-counter medicines in place of prescription medicines unless your health care provider approves.  Keep all follow-up visits as told by your health care provider. This is important. Contact a health care provider if:  Your symptoms do not improve within 7 days of starting treatment.  You have ongoing indigestion or heartburn. Get help right away if:  You have sudden, sharp, or persistent pain in your abdomen.  You have bloody or dark black, tarry stools.  You vomit blood or material that looks like coffee grounds.  You become light-headed or you feel faint.  You become weak.  You become  sweaty or clammy. This information is not intended to replace advice given to you by your health care provider. Make sure you discuss any questions you have with your health care provider. Document Released: 11/13/2000 Document Revised: 04/20/2016 Document Reviewed: 08/17/2015 Elsevier Interactive Patient Education  [employer].

## 2018-02-10 NOTE — Progress Notes (Signed)
Assessment and Plan:  Tracy Brady was seen today for acute visit and epigastric pain.  Diagnoses and all orders for this visit:  Epigastric pain Strong suspicion of Gastritis/ulcer r/t NSAID use; alternatively will consider pancratic/gallbladder origin May follow up with ultrasound if today's work up doesn't narrow down differential -     Urinalysis w microscopic + reflex cultur -     Amylase -     CBC with Differential/Platelet -     BASIC METABOLIC PANEL WITH GFR -     Hepatic function panel -     Lipase -     H. pylori breath test  Further disposition pending results of labs. Discussed med's effects and SE's.   Over 30 minutes of exam, counseling, chart review, and critical decision making was performed.   Future Appointments  Date Time Provider [place]  03/17/2018  8:45 AM Vicie Mutters, PA-C [facility] None  09/12/2018  9:00 AM Vicie Mutters, PA-C [facility] None    ------------------------------------------------------------------------------------------------------------------   HPI BP 122/70   Pulse 81   Temp 97.8 F (36.6 C)   Resp 16   Ht 5\' 3"  (1.6 m)   Wt 140 lb 9.6 oz (63.8 kg)   SpO2 97%   BMI 24.91 kg/m   62 y.o.female with hx of well controlled hyperlipidemia and otherwise benign medical hx presents for c/o upper abdominal discomfort after meals that has progressed to pain as of last night, gnawing, non-radiating pain 5/10, accompanied by nausea and an odd taste in her mouth. She reports she attempted to throw up (unsuccessfully), followed by headache 9/10 and general sense of feeling unwell. Today has ongoing intermittent pain today, currently comes every 30-60 min "like a strong hunger pain" with nausea. She denies dyspnea, palpitations, chest pain/pressure, fatigue, anxiety. She denies fever/chills, diarrhea, melena, hematochezia.   She has tried mylanta, alka-seltzer, tums with mild improvement   She does have recent hx of rotator cuff surgery  for which she was taking meloxicam 15 mg on a semi-regular basis until 1 month, she has also taken ibuprofen on 2 occasions this week. She does admit history of intermittent mild acid reflux typically well managed by tums/alkaseltzer.   Past Medical History:  Diagnosis Date  . Allergic rhinitis   . Basal cell carcinoma   . Hyperlipidemia   . Vitamin D deficiency      No Known Allergies  Current Outpatient Medications on File Prior to Visit  Medication Sig  . cyclobenzaprine (FLEXERIL) 10 MG tablet Take 1 tablet (10 mg total) by mouth 3 (three) times daily as needed for muscle spasms.  . meloxicam (MOBIC) 15 MG tablet Take 1/2 to 1 tablet daily with food for pain & inflammation - limit to 5 tabs/week to avoid kidney damage  . rosuvastatin (CRESTOR) 10 MG tablet Take 1 tablet (10 mg total) daily by mouth.   No current facility-administered medications on file prior to visit.     ROS: all negative except above.   Physical Exam:  BP 122/70   Pulse 81   Temp 97.8 F (36.6 C)   Resp 16   Ht 5\' 3"  (1.6 m)   Wt 140 lb 9.6 oz (63.8 kg)   SpO2 97%   BMI 24.91 kg/m   General Appearance: Well nourished, in no apparent distress. Eyes: PERRLA, EOMs, conjunctiva no swelling or erythema ENT/Mouth: No erythema, swelling, or exudate on post pharynx.  Tonsils not swollen or erythematous. Hearing normal.  Neck: Supple.  Respiratory: Respiratory effort normal, BS  equal bilaterally without rales, rhonchi, wheezing or stridor.  Cardio: RRR with no MRGs. Brisk peripheral pulses without edema.  Abdomen: Soft, + BS.  + Epigastric tenderness, no guarding, rebound, palpable hernias or masses. Lymphatics: Non tender without lymphadenopathy.  Musculoskeletal: normal gait.  Skin: Warm, dry without rashes, lesions, ecchymosis.  Neuro: Cranial nerves intact. Normal muscle tone, no cerebellar symptoms.  Psych: Awake and oriented X 3, normal affect, Insight and Judgment appropriate.    Izora Ribas, NP 4:13 PM [facility]

## 2018-02-11 ENCOUNTER — Other Ambulatory Visit: Payer: Self-pay | Admitting: Adult Health

## 2018-02-11 DIAGNOSIS — B9681 Helicobacter pylori [H. pylori] as the cause of diseases classified elsewhere: Secondary | ICD-10-CM

## 2018-02-11 DIAGNOSIS — K279 Peptic ulcer, site unspecified, unspecified as acute or chronic, without hemorrhage or perforation: Principal | ICD-10-CM

## 2018-02-11 MED ORDER — AMOXICILL-CLARITHRO-OMEPRAZOLE 500-500-20 MG PO MISC: ORAL | 0 refills | Status: DC

## 2018-02-13 LAB — HEPATIC FUNCTION PANEL
AG Ratio: 1.7 (calc) (ref 1.0–2.5)
ALKALINE PHOSPHATASE (APISO): 58 U/L (ref 33–130)
ALT: 11 U/L (ref 6–29)
AST: 15 U/L (ref 10–35)
Albumin: 4.1 g/dL (ref 3.6–5.1)
BILIRUBIN DIRECT: 0.1 mg/dL (ref 0.0–0.2)
BILIRUBIN INDIRECT: 0.4 mg/dL (ref 0.2–1.2)
Globulin: 2.4 g/dL (calc) (ref 1.9–3.7)
Total Bilirubin: 0.5 mg/dL (ref 0.2–1.2)
Total Protein: 6.5 g/dL (ref 6.1–8.1)

## 2018-02-13 LAB — CBC WITH DIFFERENTIAL/PLATELET
BASOS PCT: 1.2 %
Basophils Absolute: 70 cells/uL (ref 0–200)
Eosinophils Absolute: 110 cells/uL (ref 15–500)
Eosinophils Relative: 1.9 %
HCT: 36.5 % (ref 35.0–45.0)
Hemoglobin: 12.6 g/dL (ref 11.7–15.5)
Lymphs Abs: 1920 cells/uL (ref 850–3900)
MCH: 32.1 pg (ref 27.0–33.0)
MCHC: 34.5 g/dL (ref 32.0–36.0)
MCV: 92.9 fL (ref 80.0–100.0)
MONOS PCT: 7.2 %
MPV: 11.1 fL (ref 7.5–12.5)
Neutro Abs: 3283 cells/uL (ref 1500–7800)
Neutrophils Relative %: 56.6 %
PLATELETS: 167 10*3/uL (ref 140–400)
RBC: 3.93 10*6/uL (ref 3.80–5.10)
RDW: 11.3 % (ref 11.0–15.0)
TOTAL LYMPHOCYTE: 33.1 %
WBC: 5.8 10*3/uL (ref 3.8–10.8)
WBCMIX: 418 {cells}/uL (ref 200–950)

## 2018-02-13 LAB — BASIC METABOLIC PANEL WITH GFR
BUN: 15 mg/dL (ref 7–25)
CALCIUM: 9.5 mg/dL (ref 8.6–10.4)
CHLORIDE: 103 mmol/L (ref 98–110)
CO2: 27 mmol/L (ref 20–32)
Creat: 0.82 mg/dL (ref 0.50–0.99)
GFR, EST AFRICAN AMERICAN: 88 mL/min/{1.73_m2} (ref >=60)
GFR, Est Non African American: 76 mL/min/{1.73_m2} (ref >=60)
GLUCOSE: 88 mg/dL (ref 65–99)
POTASSIUM: 3.9 mmol/L (ref 3.5–5.3)
Sodium: 139 mmol/L (ref 135–146)

## 2018-02-13 LAB — URINALYSIS W MICROSCOPIC + REFLEX CULTURE
BACTERIA UA: NONE SEEN /HPF
Bilirubin Urine: NEGATIVE
Glucose, UA: NEGATIVE
HYALINE CAST: NONE SEEN /LPF
KETONES UR: NEGATIVE
Nitrites, Initial: NEGATIVE
PH: 5.5 (ref 5.0–8.0)
PROTEIN: NEGATIVE
SQUAMOUS EPITHELIAL / LPF: NONE SEEN /HPF (ref <=5)
Specific Gravity, Urine: 1.016 (ref 1.001–1.03)

## 2018-02-13 LAB — H. PYLORI BREATH TEST: H. pylori Breath Test: DETECTED — AB

## 2018-02-13 LAB — URINE CULTURE
MICRO NUMBER: 90330914
SPECIMEN QUALITY: ADEQUATE

## 2018-02-13 LAB — CULTURE INDICATED

## 2018-02-13 LAB — AMYLASE: Amylase: 29 U/L (ref 21–101)

## 2018-02-13 LAB — LIPASE: LIPASE: 22 U/L (ref 7–60)

## 2018-03-17 ENCOUNTER — Ambulatory Visit: Payer: Self-pay | Admitting: Physician Assistant

## 2018-04-04 ENCOUNTER — Ambulatory Visit: Payer: 59 | Admitting: Physician Assistant

## 2018-04-04 ENCOUNTER — Encounter: Payer: Self-pay | Admitting: Physician Assistant

## 2018-04-04 VITALS — BP 124/76 | HR 73 | Temp 97.7°F | Ht 63.0 in | Wt 153.0 lb

## 2018-04-04 DIAGNOSIS — E785 Hyperlipidemia, unspecified: Secondary | ICD-10-CM | POA: Diagnosis not present

## 2018-04-04 DIAGNOSIS — I10 Essential (primary) hypertension: Secondary | ICD-10-CM | POA: Diagnosis not present

## 2018-04-04 DIAGNOSIS — Z79899 Other long term (current) drug therapy: Secondary | ICD-10-CM

## 2018-04-04 LAB — COMPLETE METABOLIC PANEL WITH GFR
AG Ratio: 1.6 (calc) (ref 1.0–2.5)
ALBUMIN MSPROF: 3.8 g/dL (ref 3.6–5.1)
ALKALINE PHOSPHATASE (APISO): 54 U/L (ref 33–130)
ALT: 11 U/L (ref 6–29)
AST: 18 U/L (ref 10–35)
BUN / CREAT RATIO: 18 (calc) (ref 6–22)
BUN: 19 mg/dL (ref 7–25)
CO2: 28 mmol/L (ref 20–32)
Calcium: 8.8 mg/dL (ref 8.6–10.4)
Chloride: 107 mmol/L (ref 98–110)
Creat: 1.05 mg/dL — ABNORMAL HIGH (ref 0.50–0.99)
GFR, EST AFRICAN AMERICAN: 65 mL/min/{1.73_m2} (ref >=60)
GFR, Est Non African American: 56 mL/min/{1.73_m2} — ABNORMAL LOW (ref >=60)
GLUCOSE: 84 mg/dL (ref 65–99)
Globulin: 2.4 g/dL (calc) (ref 1.9–3.7)
Potassium: 4.4 mmol/L (ref 3.5–5.3)
Sodium: 140 mmol/L (ref 135–146)
TOTAL PROTEIN: 6.2 g/dL (ref 6.1–8.1)
Total Bilirubin: 0.5 mg/dL (ref 0.2–1.2)

## 2018-04-04 LAB — CBC WITH DIFFERENTIAL/PLATELET
BASOS PCT: 1.4 %
Basophils Absolute: 62 cells/uL (ref 0–200)
EOS PCT: 5.7 %
Eosinophils Absolute: 251 cells/uL (ref 15–500)
HCT: 32.8 % — ABNORMAL LOW (ref 35.0–45.0)
HEMOGLOBIN: 11.3 g/dL — AB (ref 11.7–15.5)
Lymphs Abs: 1505 cells/uL (ref 850–3900)
MCH: 32.1 pg (ref 27.0–33.0)
MCHC: 34.5 g/dL (ref 32.0–36.0)
MCV: 93.2 fL (ref 80.0–100.0)
MONOS PCT: 8.9 %
MPV: 11.1 fL (ref 7.5–12.5)
NEUTROS ABS: 2191 {cells}/uL (ref 1500–7800)
Neutrophils Relative %: 49.8 %
Platelets: 163 10*3/uL (ref 140–400)
RBC: 3.52 10*6/uL — AB (ref 3.80–5.10)
RDW: 12 % (ref 11.0–15.0)
Total Lymphocyte: 34.2 %
WBC mixed population: 392 cells/uL (ref 200–950)
WBC: 4.4 10*3/uL (ref 3.8–10.8)

## 2018-04-04 LAB — LIPID PANEL
CHOL/HDL RATIO: 1.8 (calc) (ref <5.0)
CHOLESTEROL: 147 mg/dL (ref <200)
HDL: 84 mg/dL (ref >50)
LDL CHOLESTEROL (CALC): 54 mg/dL
Non-HDL Cholesterol (Calc): 63 mg/dL (calc) (ref <130)
Triglycerides: 32 mg/dL (ref <150)

## 2018-04-04 MED ORDER — ROSUVASTATIN CALCIUM 10 MG PO TABS: 10.0000 mg | ORAL_TABLET | Freq: Every day | ORAL | 1 refills | Status: DC

## 2018-04-04 NOTE — Patient Instructions (Signed)
Check out  Mini habits for weight loss book  2 apps for tracking food is myfitness pal  loseit OR can take picture of your food     When it comes to diets, agreement about the perfect plan isn't easy to find, even among the experts. Experts at the [employer] developed an idea known as the Healthy Eating Plate. Just imagine a plate divided into logical, healthy portions.  The emphasis is on diet quality:  Load up on vegetables and fruits - one-half of your plate: Aim for color and variety, and remember that potatoes don't count.  Go for whole grains - one-quarter of your plate: Whole wheat, barley, wheat berries, quinoa, oats, brown rice, and foods made with them. If you want pasta, go with whole wheat pasta.  Protein power - one-quarter of your plate: Fish, chicken, beans, and nuts are all healthy, versatile protein sources. Limit red meat.  The diet, however, does go beyond the plate, offering a few other suggestions.  Use healthy plant oils, such as olive, canola, soy, corn, sunflower and peanut. Check the labels, and avoid partially hydrogenated oil, which have unhealthy trans fats.  If you're thirsty, drink water. Coffee and tea are good in moderation, but skip sugary drinks and limit milk and dairy products to one or two daily servings.  The type of carbohydrate in the diet is more important than the amount. Some sources of carbohydrates, such as vegetables, fruits, whole grains, and beans-are healthier than others.  Finally, stay active.  Veggies are great because you can eat a ton! They are low in calories, great to fill you up, and have a ton of vitamins, minerals, and protein.

## 2018-04-04 NOTE — Progress Notes (Signed)
Follow up  Assessment and Plan:  Essential hypertension - continue medications, DASH diet, exercise and monitor at home. Call if greater than 130/80.   Hyperlipidemia, unspecified hyperlipidemia type -continue medications, check lipids, decrease fatty foods, increase activity.  -     Lipid panel  Allergic rhinitis, unspecified seasonality, unspecified trigger Continue OTC meds   Discussed med's effects and SE's. Screening labs and tests as requested with regular follow-up as recommended.  Future Appointments  Date Time Provider [place]  09/12/2018  9:00 AM Vicie Mutters, PA-C [facility] None     HPI  62 y.o. female  presents for a follow up.   Her blood pressure has been controlled at home, today their BP is BP: 124/76.  She does workout. She denies chest pain, shortness of breath, dizziness.   She reports that she is going to [employer] and using the treadmill there.   She was treated for H pylori 02/11/2018, has not had any more heart burn or epigastric pain.    She is on cholesterol medication, crestor 10mg  and denies myalgias. Her cholesterol is at goal. The cholesterol last visit was:  Lab Results  Component Value Date   CHOL 233 (H) 09/06/2017   HDL 63 09/06/2017   LDLCALC 154 (H) 09/06/2017   TRIG 65 09/06/2017   CHOLHDL 3.7 09/06/2017  .  Last A1C in the office was:  Lab Results  Component Value Date   HGBA1C 5.0 07/27/2016   Patient is on Vitamin D supplement.   Lab Results  Component Value Date   VD25OH 31 09/06/2017      BMI is Body mass index is 27.1 kg/m., she is working on diet and exercise.  Wt Readings from Last 3 Encounters:  04/04/18 153 lb (69.4 kg)  02/10/18 140 lb 9.6 oz (63.8 kg)  09/06/17 116 lb 9.6 oz (52.9 kg)     Current Medications:  Current Outpatient Medications on File Prior to Visit  Medication Sig Dispense Refill  . rosuvastatin (CRESTOR) 10 MG tablet Take 1 tablet (10 mg total) daily by mouth. 90 tablet 1    No current facility-administered medications on file prior to visit.     Medical History:  Past Medical History:  Diagnosis Date  . Allergic rhinitis   . Basal cell carcinoma   . Hyperlipidemia   . Vitamin D deficiency    Allergies No Known Allergies  SURGICAL HISTORY She  has a past surgical history that includes Tympanoplasty (Left). FAMILY HISTORY Her family history includes Heart attack in her mother; Heart disease in her mother; Hyperlipidemia in her mother; Hypertension in her mother. SOCIAL HISTORY She  reports that she has never smoked. She has never used smokeless tobacco. She reports that she does not drink alcohol or use drugs.  Review of Systems: Review of Systems  Constitutional: Negative for chills, fever and malaise/fatigue.  HENT: Negative for congestion, ear pain and sore throat.   Eyes: Negative.   Respiratory: Negative for cough, shortness of breath and wheezing.   Cardiovascular: Negative for chest pain, palpitations and leg swelling.  Gastrointestinal: Negative for abdominal pain, blood in stool, constipation, diarrhea, heartburn and melena.  Genitourinary: Negative.   Musculoskeletal: Positive for myalgias (left shoulder). Negative for back pain, falls, joint pain and neck pain.  Skin: Negative.   Neurological: Negative for dizziness, sensory change, loss of consciousness and headaches.  Psychiatric/Behavioral: Negative for depression. The patient is not nervous/anxious and does not have insomnia.     Physical Exam: Estimated  body mass index is 27.1 kg/m as calculated from the following:   Height as of this encounter: 5\' 3"  (1.6 m).   Weight as of this encounter: 153 lb (69.4 kg). BP 124/76   Pulse 73   Temp 97.7 F (36.5 C)   Ht 5\' 3"  (1.6 m)   Wt 153 lb (69.4 kg)   SpO2 94%   BMI 27.10 kg/m   General Appearance: Well nourished well developed, in no apparent distress.  Eyes: PERRLA, EOMs, conjunctiva no swelling or erythema ENT/Mouth:  Ear canals normal without obstruction, swelling, erythema, or discharge.  TMs normal bilaterally with no erythema, bulging, retraction, or loss of landmark.  Oropharynx moist and clear with no exudate, erythema, or swelling.   Neck: Supple, thyroid normal. No bruits.  No cervical adenopathy Respiratory: Respiratory effort normal, Breath sounds clear A&P without wheeze, rhonchi, rales.   Cardio: RRR without murmurs, rubs or gallops. Brisk peripheral pulses without edema.  Chest: symmetric, with normal excursions Abdomen: Soft, nontender, no guarding, rebound, hernias, masses, or organomegaly.  Lymphatics: Non tender without lymphadenopathy.  Musculoskeletal: Full ROM all peripheral extremities,5/5 strength, and normal gait.  Skin: Warm, dry without rashes, lesions, ecchymosis. Neuro: Awake and oriented X 3, Cranial nerves intact, reflexes equal bilaterally. Normal muscle tone, no cerebellar symptoms. Sensation intact.  Psych:  normal affect, Insight and Judgment appropriate.   Vicie Mutters 9:23 AM [facility]

## 2018-04-05 NOTE — Addendum Note (Signed)
Addended by: Vicie Mutters R on: 04/05/2018 07:13 AM   Modules accepted: Orders

## 2018-04-06 ENCOUNTER — Other Ambulatory Visit: Payer: Self-pay | Admitting: Adult Health

## 2018-04-06 DIAGNOSIS — R1013 Epigastric pain: Secondary | ICD-10-CM

## 2018-05-02 ENCOUNTER — Other Ambulatory Visit: Payer: Self-pay | Admitting: Adult Health

## 2018-05-02 DIAGNOSIS — R1013 Epigastric pain: Secondary | ICD-10-CM

## 2018-05-07 ENCOUNTER — Other Ambulatory Visit: Payer: Self-pay | Admitting: Internal Medicine

## 2018-05-07 DIAGNOSIS — M25512 Pain in left shoulder: Secondary | ICD-10-CM

## 2018-05-09 ENCOUNTER — Other Ambulatory Visit: Payer: 59

## 2018-05-09 DIAGNOSIS — I10 Essential (primary) hypertension: Secondary | ICD-10-CM | POA: Diagnosis not present

## 2018-05-09 LAB — BASIC METABOLIC PANEL WITH GFR
BUN: 15 mg/dL (ref 7–25)
CHLORIDE: 104 mmol/L (ref 98–110)
CO2: 30 mmol/L (ref 20–32)
Calcium: 8.8 mg/dL (ref 8.6–10.4)
Creat: 0.9 mg/dL (ref 0.50–0.99)
GFR, Est African American: 78 mL/min/{1.73_m2} (ref >=60)
GFR, Est Non African American: 68 mL/min/{1.73_m2} (ref >=60)
GLUCOSE: 81 mg/dL (ref 65–99)
Potassium: 4.2 mmol/L (ref 3.5–5.3)
Sodium: 138 mmol/L (ref 135–146)

## 2018-05-09 LAB — CBC WITH DIFFERENTIAL/PLATELET
BASOS PCT: 1.4 %
Basophils Absolute: 62 cells/uL (ref 0–200)
EOS ABS: 242 {cells}/uL (ref 15–500)
EOS PCT: 5.5 %
HCT: 32.1 % — ABNORMAL LOW (ref 35.0–45.0)
Hemoglobin: 11.1 g/dL — ABNORMAL LOW (ref 11.7–15.5)
Lymphs Abs: 1258 cells/uL (ref 850–3900)
MCH: 32.4 pg (ref 27.0–33.0)
MCHC: 34.6 g/dL (ref 32.0–36.0)
MCV: 93.6 fL (ref 80.0–100.0)
MONOS PCT: 8 %
MPV: 10.7 fL (ref 7.5–12.5)
Neutro Abs: 2486 cells/uL (ref 1500–7800)
Neutrophils Relative %: 56.5 %
PLATELETS: 162 10*3/uL (ref 140–400)
RBC: 3.43 10*6/uL — AB (ref 3.80–5.10)
RDW: 12.1 % (ref 11.0–15.0)
TOTAL LYMPHOCYTE: 28.6 %
WBC mixed population: 352 cells/uL (ref 200–950)
WBC: 4.4 10*3/uL (ref 3.8–10.8)

## 2018-07-14 DIAGNOSIS — R51 Headache: Secondary | ICD-10-CM | POA: Diagnosis not present

## 2018-07-14 DIAGNOSIS — H43392 Other vitreous opacities, left eye: Secondary | ICD-10-CM | POA: Diagnosis not present

## 2018-08-04 ENCOUNTER — Encounter: Payer: Self-pay | Admitting: Physician Assistant

## 2018-08-04 ENCOUNTER — Ambulatory Visit (HOSPITAL_COMMUNITY)
Admission: RE | Admit: 2018-08-04 | Discharge: 2018-08-04 | Disposition: A | Discharge status: Home / Self Care | Payer: 59 | Source: Ambulatory Visit | Attending: Physician Assistant | Admitting: Physician Assistant

## 2018-08-04 ENCOUNTER — Ambulatory Visit: Payer: 59 | Admitting: Physician Assistant

## 2018-08-04 VITALS — BP 122/74 | HR 91 | Temp 98.3°F | Ht 63.0 in | Wt 165.0 lb

## 2018-08-04 DIAGNOSIS — H60512 Acute actinic otitis externa, left ear: Secondary | ICD-10-CM

## 2018-08-04 DIAGNOSIS — R29898 Other symptoms and signs involving the musculoskeletal system: Secondary | ICD-10-CM | POA: Diagnosis not present

## 2018-08-04 DIAGNOSIS — R531 Weakness: Secondary | ICD-10-CM | POA: Insufficient documentation

## 2018-08-04 DIAGNOSIS — M545 Low back pain: Secondary | ICD-10-CM | POA: Insufficient documentation

## 2018-08-04 DIAGNOSIS — H9191 Unspecified hearing loss, right ear: Secondary | ICD-10-CM | POA: Diagnosis not present

## 2018-08-04 MED ORDER — NEOMYCIN-POLYMYXIN-HC 1 % OT SOLN: 3.0000 [drp] | Freq: Four times a day (QID) | OTIC | 0 refills | Status: DC

## 2018-08-04 NOTE — Patient Instructions (Signed)
Try the exercises and other information in the back care manual, meloxicam once during the day as needed (avoid taking other NSAIDS like Alleve or Ibuprofen while taking this) and then flexeril if needed at bedtime for muscle spasm. This can be taken up to every 8 hours, but causes sedation, so should not drive or operate heavy machinery while taking this medicine.   Go to the ER if you have any new weakness in your legs, have trouble controlling your urine or bowels, or have worsening pain.   If you are not better in 1-3 month we will refer you to ortho   Back pain Rehab Ask your health care provider which exercises are safe for you. Do exercises exactly as told by your health care provider and adjust them as directed. It is normal to feel mild stretching, pulling, tightness, or discomfort as you do these exercises, but you should stop right away if you feel sudden pain or your pain gets worse.Do not begin these exercises until told by your health care provider. Stretching and range of motion exercises These exercises warm up your muscles and joints and improve the movement and flexibility of your hips and your back. These exercises also help to relieve pain, numbness, and tingling. Exercise A: Sciatic nerve glide 1. Sit in a chair with your head facing down toward your chest. Place your hands behind your back. Let your shoulders slump forward. 2. Slowly straighten one of your knees while you tilt your head back as if you are looking toward the ceiling. Only straighten your leg as far as you can without making your symptoms worse. 3. Hold for __________ seconds. 4. Slowly return to the starting position. 5. Repeat with your other leg. Repeat __________ times. Complete this exercise __________ times a day. Exercise B: Knee to chest with hip adduction and internal rotation  1. Lie on your back on a firm surface with both legs straight. 2. Bend one of your knees and move it up toward your chest  until you feel a gentle stretch in your lower back and buttock. Then, move your knee toward the shoulder that is on the opposite side from your leg. ? Hold your leg in this position by holding onto the front of your knee. 3. Hold for __________ seconds. 4. Slowly return to the starting position. 5. Repeat with your other leg. Repeat __________ times. Complete this exercise __________ times a day. Exercise C: Prone extension on elbows  1. Lie on your abdomen on a firm surface. A bed may be too soft for this exercise. 2. Prop yourself up on your elbows. 3. Use your arms to help lift your chest up until you feel a gentle stretch in your abdomen and your lower back. ? This will place some of your body weight on your elbows. If this is uncomfortable, try stacking pillows under your chest. ? Your hips should stay down, against the surface that you are lying on. Keep your hip and back muscles relaxed. 4. Hold for __________ seconds. 5. Slowly relax your upper body and return to the starting position. Repeat __________ times. Complete this exercise __________ times a day. Strengthening exercises These exercises build strength and endurance in your back. Endurance is the ability to use your muscles for a long time, even after they get tired. Exercise D: Pelvic tilt 1. Lie on your back on a firm surface. Bend your knees and keep your feet flat. 2. Tense your abdominal muscles. Tip your pelvis up  toward the ceiling and flatten your lower back into the floor. ? To help with this exercise, you may place a small towel under your lower back and try to push your back into the towel. 3. Hold for __________ seconds. 4. Let your muscles relax completely before you repeat this exercise. Repeat __________ times. Complete this exercise __________ times a day. Exercise E: Alternating arm and leg raises  1. Get on your hands and knees on a firm surface. If you are on a hard floor, you may want to use padding to  cushion your knees, such as an exercise mat. 2. Line up your arms and legs. Your hands should be below your shoulders, and your knees should be below your hips. 3. Lift your left leg behind you. At the same time, raise your right arm and straighten it in front of you. ? Do not lift your leg higher than your hip. ? Do not lift your arm higher than your shoulder. ? Keep your abdominal and back muscles tight. ? Keep your hips facing the ground. ? Do not arch your back. ? Keep your balance carefully, and do not hold your breath. 4. Hold for __________ seconds. 5. Slowly return to the starting position and repeat with your right leg and your left arm. Repeat __________ times. Complete this exercise __________ times a day. Posture and body mechanics  Body mechanics refers to the movements and positions of your body while you do your daily activities. Posture is part of body mechanics. Good posture and healthy body mechanics can help to relieve stress in your body's tissues and joints. Good posture means that your spine is in its natural S-curve position (your spine is neutral), your shoulders are pulled back slightly, and your head is not tipped forward. The following are general guidelines for applying improved posture and body mechanics to your everyday activities. Standing   When standing, keep your spine neutral and your feet about hip-width apart. Keep a slight bend in your knees. Your ears, shoulders, and hips should line up.  When you do a task in which you stand in one place for a long time, place one foot up on a stable object that is 2-4 inches (5-10 cm) high, such as a footstool. This helps keep your spine neutral. Sitting   When sitting, keep your spine neutral and keep your feet flat on the floor. Use a footrest, if necessary, and keep your thighs parallel to the floor. Avoid rounding your shoulders, and avoid tilting your head forward.  When working at a desk or a computer, keep  your desk at a height where your hands are slightly lower than your elbows. Slide your chair under your desk so you are close enough to maintain good posture.  When working at a computer, place your monitor at a height where you are looking straight ahead and you do not have to tilt your head forward or downward to look at the screen. Resting   When lying down and resting, avoid positions that are most painful for you.  If you have pain with activities such as sitting, bending, stooping, or squatting (flexion-based activities), lie in a position in which your body does not bend very much. For example, avoid curling up on your side with your arms and knees near your chest (fetal position).  If you have pain with activities such as standing for a long time or reaching with your arms (extension-based activities), lie with your spine in a neutral  position and bend your knees slightly. Try the following positions: ? Lying on your side with a pillow between your knees. ? Lying on your back with a pillow under your knees. Lifting   When lifting objects, keep your feet at least shoulder-width apart and tighten your abdominal muscles.  Bend your knees and hips and keep your spine neutral. It is important to lift using the strength of your legs, not your back. Do not lock your knees straight out.  Always ask for help to lift heavy or awkward objects. This information is not intended to replace advice given to you by your health care provider. Make sure you discuss any questions you have with your health care provider. Document Released: 11/16/2005 Document Revised: 07/23/2016 Document Reviewed: 08/02/2015 Elsevier Interactive Patient Education  [employer].

## 2018-08-04 NOTE — Progress Notes (Signed)
   Subjective:    Patient ID: Tracy Brady, female    DOB: 1956-04-25, 62 y.o.   MRN: [id number]  HPI 62 y.o. WF presents with left ear sore x 2 weeks.   She also states yesterday she was at a nursing home and when she went to stand she felt her legs were numb and she could barely walk due to weakness, no pain, no burning/stinging sensation. It lasted for a few minutes, better with walking. It is the second time that it has happened in 6 months. She states her right big toe will go numb.   Patient denies fever, hematuria, tingling and saddle anesthesia. She has had incontinence but this is unchanged.   Had ear surgery with Dr. Lutricia Feil but still having issues with right ear, decreased hearing. Has external sore in right ear canal. No fever, chills.    Blood pressure 122/74, pulse 91, temperature 98.3 F (36.8 C), height 5\' 3"  (1.6 m), weight 165 lb (74.8 kg), SpO2 97 %.  Medications Current Outpatient Medications on File Prior to Visit  Medication Sig  . rosuvastatin (CRESTOR) 10 MG tablet Take 1 tablet (10 mg total) by mouth daily.   No current facility-administered medications on file prior to visit.     Problem list She has Hyperlipidemia; Vitamin D deficiency; Allergic rhinitis; Basal cell carcinoma; and HTN (hypertension) on their problem list.   Review of Systems     Objective:   Physical Exam  Constitutional: She is oriented to person, place, and time. She appears well-developed and well-nourished. No distress.  HENT:  Left ear with small cyst at 4 oclock mild erythema and tenderness, no lymphadenopathy. Right ear with white growth/film on TM, non tender.   Eyes: Pupils are equal, round, and reactive to light. EOM are normal. Right eye exhibits no discharge. Left eye exhibits no discharge.  Neck: Normal range of motion.  Musculoskeletal: She exhibits tenderness (bilateral SI). She exhibits no edema or deformity.  Lymphadenopathy:    She has no cervical adenopathy.   Neurological: She is alert and oriented to person, place, and time. No cranial nerve deficit.  Normal LE DTRS except decreased right ankle reflex, good senstaion and pulses distally.   Skin: Skin is warm and dry. No rash noted.        Assessment & Plan:    Acute actinic otitis externa of left ear -     DG Lumbar Spine Complete; Future --     NEOMYCIN-POLYMYXIN-HYDROCORTISONE (CORTISPORIN) 1 % SOLN OTIC solution; Place 3 drops into both ears 4 (four) times daily.  Decreased hearing of right ear -     Ambulatory referral to ENT - abnormal appearance, recent surgery but will refer to ENT -wants to see different ENT  Leg weakness, bilateral Get Xray, stretches given, if not better will refer to ortho Go to the ER if you have any new weakness in your legs, have trouble controlling your urine or bowels, or have worsening pain.

## 2018-08-08 DIAGNOSIS — M8589 Other specified disorders of bone density and structure, multiple sites: Secondary | ICD-10-CM | POA: Diagnosis not present

## 2018-08-08 DIAGNOSIS — Z1231 Encounter for screening mammogram for malignant neoplasm of breast: Secondary | ICD-10-CM | POA: Diagnosis not present

## 2018-08-08 DIAGNOSIS — Z780 Asymptomatic menopausal state: Secondary | ICD-10-CM | POA: Diagnosis not present

## 2018-08-08 LAB — HM DEXA SCAN

## 2018-08-08 LAB — HM MAMMOGRAPHY

## 2018-08-09 ENCOUNTER — Encounter: Payer: Self-pay | Admitting: *Deleted

## 2018-09-10 NOTE — Progress Notes (Signed)
Complete Physical  Assessment and Plan:  Essential hypertension - continue medications, DASH diet, exercise and monitor at home. Call if greater than 130/80.  -     CBC with Differential/Platelet -     BASIC METABOLIC PANEL WITH GFR -     Hepatic function panel -     TSH -     Urinalysis, Routine w reflex microscopic -     Microalbumin / creatinine urine ratio -     EKG 12-Lead  Hyperlipidemia, unspecified hyperlipidemia type -continue medications, check lipids, decrease fatty foods, increase activity.  -     Lipid panel  Allergic rhinitis, unspecified seasonality, unspecified trigger Continue OTC meds  Basal cell carcinoma (BCC), unspecified site Continue to monitor  Vitamin D deficiency -     VITAMIN D 25 Hydroxy (Vit-D Deficiency, Fractures)  Medication management -     Magnesium  Screening for deficiency anemia -     Iron,Total/Total Iron Binding Cap -     Vitamin B12  Routine general medical examination at a health care facility  Needs flu shot -     FLU VACCINE MDCK QUAD W/Preservative   Discussed med's effects and SE's. Screening labs and tests as requested with regular follow-up as recommended.  HPI  62 y.o. female  presents for a complete physical.  Her blood pressure has been controlled at home, today their BP is BP: 128/64.  She does workout. She denies chest pain, shortness of breath, dizziness.   She reports that [facility] is going to [employer] and using the treadmill there.    She is on cholesterol medication, crestor and denies myalgias. Her cholesterol is at goal. The cholesterol last visit was:  Lab Results  Component Value Date   CHOL 147 04/04/2018   HDL 84 04/04/2018   LDLCALC 54 04/04/2018   TRIG 32 04/04/2018   CHOLHDL 1.8 04/04/2018  . She has been working on diet and exercise for prediabetes, . Last A1C in the office was:  Lab Results  Component Value Date   HGBA1C 5.0 07/27/2016   Patient is on Vitamin D supplement, 1500 IU a day.    Lab Results  Component Value Date   VD25OH 31 09/06/2017      She still sees Dr. Marvel Plan her Obgyn once yearly.  BMI is Body mass index is 28.7 kg/m., she is working on diet and exercise. Wt Readings from Last 3 Encounters:  09/12/18 162 lb (73.5 kg)  08/04/18 165 lb (74.8 kg)  04/04/18 153 lb (69.4 kg)     Current Medications:  Current Outpatient Medications on File Prior to Visit  Medication Sig Dispense Refill  . rosuvastatin (CRESTOR) 10 MG tablet Take 1 tablet (10 mg total) by mouth daily. 90 tablet 1   No current facility-administered medications on file prior to visit.     Health Maintenance:   Immunization History  Administered Date(s) Administered  . Influenza Inj Mdck Quad With Preservative 09/06/2017, 09/12/2018  . Influenza Whole 09/02/2013  . Influenza, Seasonal, Injecte, Preservative Fre 11/08/2015  . Influenza-Unspecified 07/29/2016  . PPD Test 04/24/2014  . Pneumococcal Polysaccharide-23 04/13/2013  . Tdap 10/16/2008, 09/06/2017   Tetanus: 2018 Pneumovax: 2014 Flu vaccine: 2019  Pap: 07/2017 MGM: 07/2018 DEXA: 07/2018 Colonoscopy: 2011 Last Dental Exam: Once yearly Last Eye Exam: Dr. Ronnald Ramp  Patient Care Team: Unk Pinto, MD as PCP - General (Internal Medicine) Maisie Fus, MD as Consulting Physician (Obstetrics and Gynecology) Leta Baptist, MD as Consulting Physician (Otolaryngology) Laurence Aly,  OD (Optometry) Irene Shipper, MD as Consulting Physician (Gastroenterology) Signe Colt, MD as Referring Physician (Obstetrics and Gynecology) Danella Sensing, MD as Consulting Physician (Dermatology)  Medical History:  Past Medical History:  Diagnosis Date  . Allergic rhinitis   . Basal cell carcinoma   . Hyperlipidemia   . Vitamin D deficiency    Allergies No Known Allergies  SURGICAL HISTORY She  has a past surgical history that includes Tympanoplasty (Left). FAMILY HISTORY Her family history includes Heart attack in her  mother; Heart disease in her mother; Hyperlipidemia in her mother; Hypertension in her mother. SOCIAL HISTORY She  reports that she has never smoked. She has never used smokeless tobacco. She reports that she does not drink alcohol or use drugs.   Review of Systems: Review of Systems  Constitutional: Negative for chills, fever and malaise/fatigue.  HENT: Negative for congestion, ear pain and sore throat.   Eyes: Negative.   Respiratory: Negative for cough, shortness of breath and wheezing.   Cardiovascular: Negative for chest pain, palpitations and leg swelling.  Gastrointestinal: Negative for abdominal pain, blood in stool, constipation, diarrhea, heartburn and melena.  Genitourinary: Negative.   Musculoskeletal: Negative for back pain, falls, joint pain, myalgias and neck pain.  Skin: Negative.   Neurological: Negative for dizziness, sensory change, loss of consciousness and headaches.  Psychiatric/Behavioral: Negative for depression. The patient is not nervous/anxious and does not have insomnia.     Physical Exam: Estimated body mass index is 28.7 kg/m as calculated from the following:   Height as of this encounter: 5\' 3"  (1.6 m).   Weight as of this encounter: 162 lb (73.5 kg). BP 128/64   Pulse 61   Temp 97.9 F (36.6 C)   Resp 16   Ht 5\' 3"  (1.6 m)   Wt 162 lb (73.5 kg)   SpO2 98%   BMI 28.70 kg/m   General Appearance: Well nourished well developed, in no apparent distress.  Eyes: PERRLA, EOMs, conjunctiva no swelling or erythema ENT/Mouth: Ear canals normal without obstruction, swelling, erythema, or discharge.  TMs normal bilaterally with no erythema, bulging, retraction, or loss of landmark.  Oropharynx moist and clear with no exudate, erythema, or swelling.   Neck: Supple, thyroid normal. No bruits.  No cervical adenopathy Respiratory: Respiratory effort normal, Breath sounds clear A&P without wheeze, rhonchi, rales.   Cardio: RRR without murmurs, rubs or gallops.  Brisk peripheral pulses without edema.  Chest: symmetric, with normal excursions Breasts: Symmetric, without lumps, nipple discharge, retractions.  Abdomen: Soft, nontender, no guarding, rebound, hernias, masses, or organomegaly.  Lymphatics: Non tender without lymphadenopathy.  Musculoskeletal: Full ROM all peripheral extremities,5/5 strength, and normal gait. Skin: Warm, dry without rashes, lesions, ecchymosis. Neuro: Awake and oriented X 3, Cranial nerves intact, reflexes equal bilaterally. Normal muscle tone, no cerebellar symptoms. Sensation intact.  Psych:  normal affect, Insight and Judgment appropriate.   EKG: WNL no changes.  Over 40 minutes of exam, counseling, chart review and critical decision making was performed  Vicie Mutters 8:56 AM [facility]

## 2018-09-12 ENCOUNTER — Ambulatory Visit: Payer: 59 | Admitting: Physician Assistant

## 2018-09-12 ENCOUNTER — Encounter: Payer: Self-pay | Admitting: Physician Assistant

## 2018-09-12 VITALS — BP 128/64 | HR 61 | Temp 97.9°F | Resp 16 | Ht 63.0 in | Wt 162.0 lb

## 2018-09-12 DIAGNOSIS — E785 Hyperlipidemia, unspecified: Secondary | ICD-10-CM | POA: Diagnosis not present

## 2018-09-12 DIAGNOSIS — Z23 Encounter for immunization: Secondary | ICD-10-CM

## 2018-09-12 DIAGNOSIS — E559 Vitamin D deficiency, unspecified: Secondary | ICD-10-CM

## 2018-09-12 DIAGNOSIS — C4491 Basal cell carcinoma of skin, unspecified: Secondary | ICD-10-CM

## 2018-09-12 DIAGNOSIS — Z130 Encounter for screening for diseases of the blood and blood-forming organs and certain disorders involving the immune mechanism: Secondary | ICD-10-CM

## 2018-09-12 DIAGNOSIS — R350 Frequency of micturition: Secondary | ICD-10-CM

## 2018-09-12 DIAGNOSIS — I10 Essential (primary) hypertension: Secondary | ICD-10-CM

## 2018-09-12 DIAGNOSIS — Z79899 Other long term (current) drug therapy: Secondary | ICD-10-CM | POA: Diagnosis not present

## 2018-09-12 DIAGNOSIS — Z136 Encounter for screening for cardiovascular disorders: Secondary | ICD-10-CM

## 2018-09-12 DIAGNOSIS — J309 Allergic rhinitis, unspecified: Secondary | ICD-10-CM

## 2018-09-12 DIAGNOSIS — E039 Hypothyroidism, unspecified: Secondary | ICD-10-CM

## 2018-09-12 DIAGNOSIS — Z0000 Encounter for general adult medical examination without abnormal findings: Secondary | ICD-10-CM | POA: Diagnosis not present

## 2018-09-12 NOTE — Patient Instructions (Signed)
Know what a healthy weight is for you (roughly BMI <25) and aim to maintain this  Aim for 7+ servings of fruits and vegetables daily  65-80+ fluid ounces of water or unsweet tea for healthy kidneys  Limit to max 1 drink of alcohol per day; avoid smoking/tobacco  Limit animal fats in diet for cholesterol and heart health - choose grass fed whenever available  Avoid highly processed foods, and foods high in saturated/trans fats  Aim for low stress - take time to unwind and care for your mental health  Aim for 150 min of moderate intensity exercise weekly for heart health, and weights twice weekly for bone health  Aim for 7-9 hours of sleep daily   VAGINAL DRYNESS OVERVIEW  Vaginal dryness, also known as atrophic vaginitis, is a common condition in postmenopausal women. This condition is also common in women who have had both ovaries removed at the time of hysterectomy.   Some women have uncomfortable symptoms of vaginal dryness, such as pain with sex, burning vaginal discomfort or itching, or abnormal vaginal discharge, while others have no symptoms at all.  VAGINAL DRYNESS CAUSES   Estrogen helps to keep the vagina moist and to maintain thickness of the vaginal lining. Vaginal dryness occurs when the ovaries produce a decreased amount of estrogen. This can occur at certain times in a woman's life, and may be permanent or temporary. Times when less estrogen is made include: ?At the time of menopause. ?After surgical removal of the ovaries, chemotherapy, or radiation therapy of the pelvis for cancer. ?After having a baby, particularly in women who breastfeed. ?While using certain medications, such as danazol, medroxyprogesterone (brand names: Provera or DepoProvera), leuprolide (brand name: Lupron), or nafarelin. When these medications are stopped, estrogen production resumes.  Women who smoke cigarettes have been shown to have an increased risk of an earlier menopause transition as  compared to non-smokers. Therefore, atrophic vaginitis symptoms may appear at a younger age in this population.  VAGINAL DRYNESS TREATMENT   There are three treatment options for women with vaginal dryness:  Vaginal lubricants and moisturizers - Vaginal lubricants and moisturizers can be purchased without a prescription. These products do not contain any hormones and have virtually no side effects. - Albolene is found in the facial cleanser section at [facility], [facility], or [employer]. It is a large jar with a blue top. This is the best lubricant for women because it is hypoallergenic. -Natural lubricants, such as olive, avocado or peanut oil, are easily available products that may be used as a lubricant with sex.  -Vaginal moisturizes (eg, Replens, Moist Again, Vagisil, K-Y Silk-E, and Feminease) are formulated to allow water to be retained in the vaginal tissues. Moisturizers are applied into the vagina three times weekly to allow a continued moisturizing effect. These should not be used just before having sex, as they can be irritating.  Vaginal estrogen - Vaginal estrogen is the most effective treatment option for women with vaginal dryness. Vaginal estrogen must be prescribed by a healthcare provider. Very low doses of vaginal estrogen can be used when it is put into the vagina to treat vaginal dryness. A small amount of estrogen is absorbed into the bloodstream, but only about 100 times less than when using estrogen pills or tablets. As a result, there is a much lower risk of side effects, such as blood clots, breast cancer, and heart attack, compared with other estrogen-containing products (birth control pills, menopausal hormone therapy).   Ospemifene - Ospemifene is  a prescription medication that is similar to estrogen, but is not estrogen. In the vaginal tissue, it acts similarly to estrogen. In the breast tissue, it acts as an estrogen blocker. It comes in a pill, and is prescribed for women who  want to use an estrogen-like medication for vaginal dryness or painful sex associated with vaginal dryness, but prefer not to use a vaginal medication. The medication may cause hot flashes as a side effect. This type of medication may increase the risk of blood clots or uterine cancer. Further study of ospemifene is needed to evaluate the risk of these complications. This medication has not been tested in women who have had breast cancer or are at a high risk of developing breast cancer.    Sexual activity - Vaginal estrogen improves vaginal dryness quickly, usually within a few weeks. You may continue to have sex as you treat vaginal dryness because sex itself can help to keep the vaginal tissues healthy. Vaginal intercourse may help the vaginal tissues by keeping them soft and stretchable and preventing the tissues from shrinking.  If sex continues to be painful despite treatment for vaginal dryness, talk to your healthcare provider.

## 2018-09-13 ENCOUNTER — Other Ambulatory Visit: Payer: Self-pay

## 2018-09-13 DIAGNOSIS — R350 Frequency of micturition: Secondary | ICD-10-CM | POA: Diagnosis not present

## 2018-09-13 LAB — IRON, TOTAL/TOTAL IRON BINDING CAP
%SAT: 42 % (calc) (ref 16–45)
IRON: 125 ug/dL (ref 45–160)
TIBC: 299 mcg/dL (calc) (ref 250–450)

## 2018-09-13 LAB — URINALYSIS, ROUTINE W REFLEX MICROSCOPIC
BILIRUBIN URINE: NEGATIVE
GLUCOSE, UA: NEGATIVE
Hyaline Cast: NONE SEEN /LPF
Ketones, ur: NEGATIVE
NITRITE: POSITIVE — AB
PH: 7 (ref 5.0–8.0)
Protein, ur: NEGATIVE
Specific Gravity, Urine: 1.016 (ref 1.001–1.03)
Squamous Epithelial / LPF: NONE SEEN /HPF (ref <=5)

## 2018-09-13 LAB — CBC WITH DIFFERENTIAL/PLATELET
BASOS ABS: 51 {cells}/uL (ref 0–200)
BASOS PCT: 1.1 %
EOS PCT: 6.3 %
Eosinophils Absolute: 290 cells/uL (ref 15–500)
HEMATOCRIT: 34.3 % — AB (ref 35.0–45.0)
HEMOGLOBIN: 11.6 g/dL — AB (ref 11.7–15.5)
LYMPHS ABS: 1343 {cells}/uL (ref 850–3900)
MCH: 32 pg (ref 27.0–33.0)
MCHC: 33.8 g/dL (ref 32.0–36.0)
MCV: 94.8 fL (ref 80.0–100.0)
MPV: 11.3 fL (ref 7.5–12.5)
Monocytes Relative: 8 %
NEUTROS ABS: 2548 {cells}/uL (ref 1500–7800)
Neutrophils Relative %: 55.4 %
Platelets: 163 10*3/uL (ref 140–400)
RBC: 3.62 10*6/uL — AB (ref 3.80–5.10)
RDW: 11.9 % (ref 11.0–15.0)
Total Lymphocyte: 29.2 %
WBC mixed population: 368 cells/uL (ref 200–950)
WBC: 4.6 10*3/uL (ref 3.8–10.8)

## 2018-09-13 LAB — COMPLETE METABOLIC PANEL WITH GFR
AG Ratio: 1.4 (calc) (ref 1.0–2.5)
ALT: 10 U/L (ref 6–29)
AST: 16 U/L (ref 10–35)
Albumin: 3.8 g/dL (ref 3.6–5.1)
Alkaline phosphatase (APISO): 54 U/L (ref 33–130)
BILIRUBIN TOTAL: 0.6 mg/dL (ref 0.2–1.2)
BUN: 14 mg/dL (ref 7–25)
CHLORIDE: 106 mmol/L (ref 98–110)
CO2: 28 mmol/L (ref 20–32)
Calcium: 8.7 mg/dL (ref 8.6–10.4)
Creat: 0.85 mg/dL (ref 0.50–0.99)
GFR, EST AFRICAN AMERICAN: 84 mL/min/{1.73_m2} (ref >=60)
GFR, Est Non African American: 72 mL/min/{1.73_m2} (ref >=60)
GLUCOSE: 76 mg/dL (ref 65–99)
Globulin: 2.7 g/dL (calc) (ref 1.9–3.7)
Potassium: 3.8 mmol/L (ref 3.5–5.3)
Sodium: 140 mmol/L (ref 135–146)
TOTAL PROTEIN: 6.5 g/dL (ref 6.1–8.1)

## 2018-09-13 LAB — TSH: TSH: 4.88 mIU/L — ABNORMAL HIGH (ref 0.40–4.50)

## 2018-09-13 LAB — MICROALBUMIN / CREATININE URINE RATIO
Creatinine, Urine: 83 mg/dL (ref 20–275)
Microalb Creat Ratio: 5 mcg/mg creat (ref <30)
Microalb, Ur: 0.4 mg/dL

## 2018-09-13 LAB — LIPID PANEL
Cholesterol: 138 mg/dL (ref <200)
HDL: 70 mg/dL (ref >50)
LDL Cholesterol (Calc): 55 mg/dL (calc)
NON-HDL CHOLESTEROL (CALC): 68 mg/dL (ref <130)
Total CHOL/HDL Ratio: 2 (calc) (ref <5.0)
Triglycerides: 44 mg/dL (ref <150)

## 2018-09-13 LAB — VITAMIN D 25 HYDROXY (VIT D DEFICIENCY, FRACTURES): VIT D 25 HYDROXY: 33 ng/mL (ref 30–100)

## 2018-09-13 LAB — MAGNESIUM: MAGNESIUM: 2 mg/dL (ref 1.5–2.5)

## 2018-09-13 LAB — VITAMIN B12: Vitamin B-12: 507 pg/mL (ref 200–1100)

## 2018-09-13 MED ORDER — NITROFURANTOIN MONOHYD MACRO 100 MG PO CAPS: 100.0000 mg | ORAL_CAPSULE | Freq: Two times a day (BID) | ORAL | 0 refills | Status: DC

## 2018-09-13 NOTE — Addendum Note (Signed)
Addended by: Vicie Mutters R on: 09/13/2018 08:55 AM   Modules accepted: Orders

## 2018-09-13 NOTE — Addendum Note (Signed)
Addended by: Dolores Hoose on: 09/13/2018 11:13 AM   Modules accepted: Orders

## 2018-09-13 NOTE — Addendum Note (Signed)
Addended by: Vicie Mutters R on: 09/13/2018 08:54 AM   Modules accepted: Orders

## 2018-09-14 DIAGNOSIS — H906 Mixed conductive and sensorineural hearing loss, bilateral: Secondary | ICD-10-CM | POA: Diagnosis not present

## 2018-09-14 DIAGNOSIS — H722X1 Other marginal perforations of tympanic membrane, right ear: Secondary | ICD-10-CM | POA: Diagnosis not present

## 2018-09-15 LAB — URINE CULTURE
MICRO NUMBER: 91240358
SPECIMEN QUALITY:: ADEQUATE

## 2018-09-28 ENCOUNTER — Other Ambulatory Visit: Payer: Self-pay | Admitting: Physician Assistant

## 2018-10-04 DIAGNOSIS — L570 Actinic keratosis: Secondary | ICD-10-CM | POA: Diagnosis not present

## 2018-10-13 ENCOUNTER — Other Ambulatory Visit: Payer: 59

## 2018-10-13 DIAGNOSIS — E039 Hypothyroidism, unspecified: Secondary | ICD-10-CM | POA: Diagnosis not present

## 2018-10-13 DIAGNOSIS — R350 Frequency of micturition: Secondary | ICD-10-CM

## 2018-10-14 ENCOUNTER — Other Ambulatory Visit: Payer: Self-pay | Admitting: Physician Assistant

## 2018-10-14 LAB — URINALYSIS, ROUTINE W REFLEX MICROSCOPIC
Bilirubin Urine: NEGATIVE
Glucose, UA: NEGATIVE
HYALINE CAST: NONE SEEN /LPF
Ketones, ur: NEGATIVE
Nitrite: NEGATIVE
Protein, ur: NEGATIVE
Specific Gravity, Urine: 1.014 (ref 1.001–1.03)
Squamous Epithelial / LPF: NONE SEEN /HPF (ref <=5)
pH: 6.5 (ref 5.0–8.0)

## 2018-10-14 LAB — THYROGLOBULIN ANTIBODY: Thyroglobulin Ab: >1000 IU/mL — ABNORMAL HIGH (ref <=1)

## 2018-10-14 LAB — TSH: TSH: 7.55 m[IU]/L — AB (ref 0.40–4.50)

## 2018-10-14 LAB — THYROID PEROXIDASE ANTIBODY: THYROID PEROXIDASE ANTIBODY: 14 [IU]/mL — AB (ref <9)

## 2018-10-14 MED ORDER — LEVOTHYROXINE SODIUM 50 MCG PO TABS: 50.0000 ug | ORAL_TABLET | Freq: Every day | ORAL | 11 refills | Status: DC

## 2018-10-30 ENCOUNTER — Other Ambulatory Visit: Payer: Self-pay | Admitting: Internal Medicine

## 2018-10-30 DIAGNOSIS — M25512 Pain in left shoulder: Secondary | ICD-10-CM

## 2018-11-14 ENCOUNTER — Other Ambulatory Visit: Payer: Self-pay | Admitting: Physician Assistant

## 2018-11-14 DIAGNOSIS — E039 Hypothyroidism, unspecified: Secondary | ICD-10-CM | POA: Insufficient documentation

## 2018-11-14 NOTE — Progress Notes (Signed)
   Assessment and Plan: Tracy Brady was seen today for follow-up.  Diagnoses and all orders for this visit:  Polyarthralgia -     Sedimentation rate -     ANA -     Anti-DNA antibody, double-stranded -     Rheumatoid factor -     CK -     RNP Antibody -     Uric acid  Hypothyroidism due to Hashimoto's thyroiditis -     TSH -     levothyroxine (SYNTHROID) 50 MCG tablet; Take 1 tablet (50 mcg total) by mouth daily.      HPI 62 y.o.female presents for follow up for thyroid.  Had + antibodies for Hashimotos hypothyroidism.   She is on thyroid medication. Her medication was not changed last visit, she is on the 50 mcg daily.    Lab Results  Component Value Date   TSH 7.55 (H) 10/13/2018   She states she has had some bilateral shoulder pain and some muscle aches in her bilateral biceps. She has bilateral ankle swelling. No family history of autoimmune disease.    Past Medical History:  Diagnosis Date  . Allergic rhinitis   . Basal cell carcinoma   . Hyperlipidemia   . Vitamin D deficiency      No Known Allergies  Current Outpatient Medications on File Prior to Visit  Medication Sig  . levothyroxine (SYNTHROID) 50 MCG tablet Take 1 tablet (50 mcg total) by mouth daily.  . meloxicam (MOBIC) 15 MG tablet TAKE 1/2 TO 1 TAB DAILY W/FOOD FOR PAIN & INFLAMMATION*LIMIT TO 5 TABS/WEEK TO AVOID KIDNEY DAMAGE  . nitrofurantoin, macrocrystal-monohydrate, (MACROBID) 100 MG capsule Take 1 capsule (100 mg total) by mouth 2 (two) times daily.  . rosuvastatin (CRESTOR) 10 MG tablet TAKE 1 TABLET BY MOUTH EVERY DAY   No current facility-administered medications on file prior to visit.     ROS: all negative except above.   Physical Exam: There were no vitals filed for this visit. There were no vitals taken for this visit. General Appearance: Well nourished, in no apparent distress. Eyes: PERRLA, EOMs, conjunctiva no swelling or erythema Sinuses: No Frontal/maxillary  tenderness ENT/Mouth: Ext aud canals clear, TMs without erythema, bulging. No erythema, swelling, or exudate on post pharynx.  Tonsils not swollen or erythematous. Hearing normal.  Neck: Supple, thyroid normal.  Respiratory: Respiratory effort normal, BS equal bilaterally without rales, rhonchi, wheezing or stridor.  Cardio: RRR with no MRGs. Brisk peripheral pulses without edema.  Abdomen: Soft, + BS.  Non tender, no guarding, rebound, hernias, masses. Lymphatics: Non tender without lymphadenopathy.  Musculoskeletal: Full ROM, 5/5 strength, normal gait.  Skin: Warm, dry without rashes, lesions, ecchymosis.  Neuro: Cranial nerves intact. Normal muscle tone, no cerebellar symptoms. Sensation intact.  Psych: Awake and oriented X 3, normal affect, Insight and Judgment appropriate.     Vicie Mutters, PA-C 12:24 PM [facility]

## 2018-11-15 ENCOUNTER — Ambulatory Visit: Payer: 59 | Admitting: Physician Assistant

## 2018-11-15 ENCOUNTER — Encounter: Payer: Self-pay | Admitting: Physician Assistant

## 2018-11-15 VITALS — BP 122/68 | HR 67 | Temp 98.2°F | Ht 63.0 in | Wt 164.4 lb

## 2018-11-15 DIAGNOSIS — E063 Autoimmune thyroiditis: Secondary | ICD-10-CM

## 2018-11-15 DIAGNOSIS — M2550 Pain in unspecified joint: Secondary | ICD-10-CM

## 2018-11-15 DIAGNOSIS — E038 Other specified hypothyroidism: Secondary | ICD-10-CM | POA: Diagnosis not present

## 2018-11-15 MED ORDER — LEVOTHYROXINE SODIUM 50 MCG PO TABS: 50.0000 ug | ORAL_TABLET | Freq: Every day | ORAL | 11 refills | Status: DC

## 2018-11-15 NOTE — Patient Instructions (Signed)
VENOUS INSUFFICIENCY Our lower leg venous system is not the most reliable, the heart does NOT pump fluid up, there is a valve system.  The muscles of the leg squeeze and the blood moves up and a valve opens and close, then they squeeze, blood moves up and valves open and closes keeping the blood moving towards the heart.  Lots can go wrong with this valve system.  If someone is sitting or standing without movement, everyone will get swelling.  THINGS TO DO:  Do not stand or sit in one position for long periods of time. Do not sit with your legs crossed. Rest with your legs raised during the day.  Your legs have to be higher than your heart so that gravity will force the valves to open, so please really elevate your legs.   Wear elastic stockings or support hose. Do not wear other tight, encircling garments around the legs, pelvis, or waist.  [employer]  has a wide variety of well priced compression stockings. [place], [place] [address] [id number] has a good cheap selection, I like the socks, they are not as hard to get on  Walk as much as possible to increase blood flow.  Raise the foot of your bed at night with 2-inch blocks.  SEEK MEDICAL CARE IF:   The skin around your ankle starts to break down.  You have pain, redness, tenderness, or hard swelling developing in your leg over a vein.  You are uncomfortable due to leg pain.  If you ever have shortness of breath with exertion or chest pain go to the ER.   Antithyroid Peroxidase Antibody Test Why am I having this test? This test is used for diagnosing different thyroid diseases. Your health care provider may perform this test along with other thyroid antibody tests to aid in specific diagnoses. What kind of sample is taken? A blood sample is required for this test. It is usually collected by inserting a needle into a vein. How do I prepare for this test? There is no preparation required for  this test. What are the reference ranges? Reference rangesare considered healthy rangesestablished after testing a large group of healthy people. Reference rangesmay vary among different people, labs, and hospitals. It is your responsibility to obtain your test results. Ask the lab or department performing the test when and how you will get your results. Reference ranges are as follows:  Less than 9 international units/mL for all ages.  What do the results mean? Increased levels of antithyroid peroxidase antibody may indicate:  Hashimoto thyroiditis.  Rheumatoid arthritis (RA).  Hypothyroidism.  Talk with your health care provider to discuss your results, treatment options, and if necessary, the need for more tests. Talk with your health care provider if you have any questions about your results. Talk with your health care provider to discuss your results, treatment options, and if necessary, the need for more tests. Talk with your health care provider if you have any questions about your results. This information is not intended to replace advice given to you by your health care provider. Make sure you discuss any questions you have with your health care provider. Document Released: 12/10/2004 Document Revised: 07/20/2016 Document Reviewed: 05/09/2014 Elsevier Interactive Patient Education  2018 [employer].   Hypothyroidism Hypothyroidism is a disorder of the thyroid. The thyroid is a large gland that is located in the lower front of the neck. The thyroid releases hormones that control  how the body works. With hypothyroidism, the thyroid does not make enough of these hormones. What are the causes? Causes of hypothyroidism may include:  Viral infections.  Pregnancy.  Your own defense system (immune system) attacking your thyroid.  Certain medicines.  Birth defects.  Past radiation treatments to your head or neck.  Past treatment with radioactive iodine.  Past surgical  removal of part or all of your thyroid.  Problems with the gland that is located in the center of your brain (pituitary).  What are the signs or symptoms? Signs and symptoms of hypothyroidism may include:  Feeling as though you have no energy (lethargy).  Inability to tolerate cold.  Weight gain that is not explained by a change in diet or exercise habits.  Dry skin.  Coarse hair.  Menstrual irregularity.  Slowing of thought processes.  Constipation.  Sadness or depression.  How is this diagnosed? Your health care provider may diagnose hypothyroidism with blood tests and ultrasound tests. How is this treated? Hypothyroidism is treated with medicine that replaces the hormones that your body does not make. After you begin treatment, it may take several weeks for symptoms to go away. Follow these instructions at home:  Take medicines only as directed by your health care provider.  If you start taking any new medicines, tell your health care provider.  Keep all follow-up visits as directed by your health care provider. This is important. As your condition improves, your dosage needs may change. You will need to have blood tests regularly so that your health care provider can watch your condition. Contact a health care provider if:  Your symptoms do not get better with treatment.  You are taking thyroid replacement medicine and: ? You sweat excessively. ? You have tremors. ? You feel anxious. ? You lose weight rapidly. ? You cannot tolerate heat. ? You have emotional swings. ? You have diarrhea. ? You feel weak. Get help right away if:  You develop chest pain.  You develop an irregular heartbeat.  You develop a rapid heartbeat. This information is not intended to replace advice given to you by your health care provider. Make sure you discuss any questions you have with your health care provider. Document Released: 11/16/2005 Document Revised: 04/23/2016 Document  Reviewed: 04/03/2014 Elsevier Interactive Patient Education  2018 [employer].

## 2018-11-17 LAB — SEDIMENTATION RATE: SED RATE: 29 mm/h (ref 0–30)

## 2018-11-17 LAB — RHEUMATOID FACTOR: Rhuematoid fact SerPl-aCnc: <14 IU/mL (ref <14)

## 2018-11-17 LAB — ANA: ANA: NEGATIVE

## 2018-11-17 LAB — CK: CK TOTAL: 109 U/L (ref 29–143)

## 2018-11-17 LAB — ANTI-DNA ANTIBODY, DOUBLE-STRANDED

## 2018-11-17 LAB — URIC ACID: Uric Acid, Serum: 5.1 mg/dL (ref 2.5–7.0)

## 2018-11-17 LAB — TSH: TSH: 2.37 mIU/L (ref 0.40–4.50)

## 2018-11-17 LAB — RNP ANTIBODY: Ribonucleic Protein(ENA) Antibody, IgG: <1 AI

## 2018-11-22 ENCOUNTER — Ambulatory Visit (HOSPITAL_COMMUNITY)
Admission: EM | Admit: 2018-11-22 | Discharge: 2018-11-22 | Disposition: A | Discharge status: Home / Self Care | Payer: 59 | Attending: Family Medicine | Admitting: Family Medicine

## 2018-11-22 ENCOUNTER — Encounter (HOSPITAL_COMMUNITY): Payer: Self-pay | Admitting: Emergency Medicine

## 2018-11-22 ENCOUNTER — Other Ambulatory Visit: Payer: Self-pay

## 2018-11-22 ENCOUNTER — Ambulatory Visit (INDEPENDENT_AMBULATORY_CARE_PROVIDER_SITE_OTHER): Payer: 59

## 2018-11-22 DIAGNOSIS — S63502A Unspecified sprain of left wrist, initial encounter: Secondary | ICD-10-CM | POA: Diagnosis not present

## 2018-11-22 DIAGNOSIS — M25532 Pain in left wrist: Secondary | ICD-10-CM

## 2018-11-22 DIAGNOSIS — S6992XA Unspecified injury of left wrist, hand and finger(s), initial encounter: Secondary | ICD-10-CM | POA: Diagnosis not present

## 2018-11-22 HISTORY — DX: Disorder of thyroid, unspecified: E07.9

## 2018-11-22 MED ORDER — HYDROCODONE-ACETAMINOPHEN 5-325 MG PO TABS: 1.0000 | ORAL_TABLET | Freq: Four times a day (QID) | ORAL | 0 refills | Status: DC | PRN

## 2018-11-22 NOTE — ED Provider Notes (Signed)
Tracy Brady    CSN: [id number] Arrival date & time: 11/22/18  1422     History   Chief Complaint Chief Complaint  Patient presents with  . Wrist Pain    HPI Tracy Brady is a 62 y.o. female.   This is the initial visit for this 62 year old woman who fell down today.  The patient presented to the [facility] with a complaint of left wrist pain secondary to a fall that occurred today. The patient reported that she slid down and caught herself with her left arm.     Past Medical History:  Diagnosis Date  . Allergic rhinitis   . Basal cell carcinoma   . Hyperlipidemia   . Thyroid disease   . Vitamin D deficiency     Patient Active Problem List   Diagnosis Date Noted  . Hypothyroidism 11/14/2018  . HTN (hypertension) 11/08/2015  . Hyperlipidemia   . Vitamin D deficiency   . Allergic rhinitis   . Basal cell carcinoma     Past Surgical History:  Procedure Laterality Date  . TYMPANOPLASTY Left     OB History   No obstetric history on file.      Home Medications    Prior to Admission medications   Medication Sig Start Date End Date Taking? Authorizing Provider  HYDROcodone-acetaminophen (NORCO) 5-325 MG tablet Take 1 tablet by mouth every 6 (six) hours as needed for moderate pain. 11/22/18   Robyn Haber, MD  levothyroxine (SYNTHROID) 50 MCG tablet Take 1 tablet (50 mcg total) by mouth daily. 11/15/18 11/15/19  Vicie Mutters, PA-C  rosuvastatin (CRESTOR) 10 MG tablet TAKE 1 TABLET BY MOUTH EVERY DAY 09/28/18   Liane Comber, NP    Family History Family History  Problem Relation Age of Onset  . Hypertension Mother   . Heart disease Mother   . Hyperlipidemia Mother   . Heart attack Mother     Social History Social History   Tobacco Use  . Smoking status: Never Smoker  . Smokeless tobacco: Never Used  Substance Use Topics  . Alcohol use: No  . Drug use: No     Allergies   Patient has no known allergies.   Review of  Systems Review of Systems   Physical Exam Triage Vital Signs ED Triage Vitals  Enc Vitals Group     BP 11/22/18 1506 136/78     Pulse Rate 11/22/18 1506 84     Resp 11/22/18 1506 18     Temp 11/22/18 1506 98.4 F (36.9 C)     Temp Source 11/22/18 1506 Oral     SpO2 11/22/18 1506 100 %     Weight --      Height --      Head Circumference --      Peak Flow --      Pain Score 11/22/18 1503 9     Pain Loc --      Pain Edu? --      Excl. in [place]? --    No data found.  Updated Vital Signs BP 136/78 (BP Location: Right Arm)   Pulse 84   Temp 98.4 F (36.9 C) (Oral)   Resp 18   SpO2 100%    Physical Exam Vitals signs and nursing note reviewed.  Constitutional:      Appearance: Normal appearance.  HENT:     Head: Normocephalic and atraumatic.  Eyes:     Conjunctiva/sclera: Conjunctivae normal.  Neck:     Musculoskeletal: Normal  range of motion and neck supple.  Pulmonary:     Effort: Pulmonary effort is normal.  Musculoskeletal:        General: Swelling present. No tenderness or deformity.     Comments: Wrist appears to be mildly swollen but there is no point tenderness on any of the bones.  Patient is reluctant to flex or extend her wrist.  Skin:    General: Skin is warm and dry.  Neurological:     General: No focal deficit present.     Mental Status: She is alert.  Psychiatric:        Mood and Affect: Mood normal.        Behavior: Behavior normal.      UC Treatments / Results  Labs (all labs ordered are listed, but only abnormal results are displayed) Labs Reviewed - No data to display  EKG None  Radiology Dg Wrist Complete Left  Result Date: 11/22/2018 CLINICAL DATA:  Left wrist pain following a fall this morning. EXAM: LEFT WRIST - COMPLETE 3+ VIEW COMPARISON:  None. FINDINGS: There is no evidence of fracture or dislocation. There is no evidence of arthropathy or other focal bone abnormality. Soft tissues are unremarkable. IMPRESSION: Normal  examination. Electronically Signed   By: Claudie Revering M.D.   On: 11/22/2018 15:24    Procedures Procedures (including critical care time)  Medications Ordered in UC Medications - No data to display  Initial Impression / Assessment and Plan / UC Course  I have reviewed the triage vital signs and the nursing notes.  Pertinent labs & imaging results that were available during my care of the patient were reviewed by me and considered in my medical decision making (see chart for details).  Clinical Course as of Nov 22 1533  Tue Nov 22, 2018  1527 DG Wrist Complete Left [KL]    Clinical Course User Index [KL] Robyn Haber, MD   Final Clinical Impressions(s) / UC Diagnoses   Final diagnoses:  Sprain of left wrist, initial encounter     Discharge Instructions     X-rays show no fracture    ED Prescriptions    Medication Sig Dispense Auth. Provider   HYDROcodone-acetaminophen (NORCO) 5-325 MG tablet Take 1 tablet by mouth every 6 (six) hours as needed for moderate pain. 12 tablet Robyn Haber, MD     Controlled Substance Prescriptions [place] Controlled Substance Registry consulted? Not Applicable   Robyn Haber, MD 11/22/18 1534

## 2018-11-22 NOTE — Discharge Instructions (Addendum)
X-rays show no fracture

## 2018-11-22 NOTE — ED Triage Notes (Signed)
The patient presented to the [facility] with a complaint of left wrist pain secondary to a fall that occurred today. The patient reported that she slid down and caught herself with her left arm.

## 2018-12-07 DIAGNOSIS — Z01419 Encounter for gynecological examination (general) (routine) without abnormal findings: Secondary | ICD-10-CM | POA: Diagnosis not present

## 2018-12-07 DIAGNOSIS — R319 Hematuria, unspecified: Secondary | ICD-10-CM | POA: Diagnosis not present

## 2018-12-07 DIAGNOSIS — Z6829 Body mass index (BMI) 29.0-29.9, adult: Secondary | ICD-10-CM | POA: Diagnosis not present

## 2018-12-07 DIAGNOSIS — Z124 Encounter for screening for malignant neoplasm of cervix: Secondary | ICD-10-CM | POA: Diagnosis not present

## 2019-01-06 ENCOUNTER — Encounter: Payer: Self-pay | Admitting: Adult Health

## 2019-01-06 ENCOUNTER — Ambulatory Visit: Payer: 59 | Admitting: Adult Health

## 2019-01-06 VITALS — BP 100/60 | HR 80 | Temp 97.7°F | Ht 63.0 in | Wt 154.0 lb

## 2019-01-06 DIAGNOSIS — J302 Other seasonal allergic rhinitis: Secondary | ICD-10-CM

## 2019-01-06 DIAGNOSIS — R05 Cough: Secondary | ICD-10-CM

## 2019-01-06 DIAGNOSIS — R058 Other specified cough: Secondary | ICD-10-CM

## 2019-01-06 MED ORDER — AZITHROMYCIN 250 MG PO TABS: ORAL_TABLET | ORAL | 1 refills | Status: AC

## 2019-01-06 MED ORDER — BENZONATATE 200 MG PO CAPS: ORAL_CAPSULE | ORAL | 1 refills | Status: DC

## 2019-01-06 MED ORDER — PREDNISONE 20 MG PO TABS: ORAL_TABLET | ORAL | 0 refills | Status: DC

## 2019-01-06 NOTE — Progress Notes (Signed)
Assessment and Plan:  Tracy Brady was seen today for acute visit.  Diagnoses and all orders for this visit:  Post-viral cough syndrome/Seasonal allergic rhinitis, unspecified trigger Suspect she had influenza last week; benign exam today, suspect post-viral cough likely superimposed on underlying allergic rhinitis Discussed the importance of avoiding unnecessary antibiotic therapy. Suggested symptomatic OTC remedies. Nasal saline spray for congestion. Nasal steroids/antihistamines discussed, rotate allergy pill, oral steroids offered Follow up as needed.  Other orders Fill and take only if progressively productive cough, chest congestion, fever/chills over the weekend.  -     azithromycin (ZITHROMAX) 250 MG tablet; Take 2 tablets (500 mg) on  Day 1,  followed by 1 tablet (250 mg) once daily on Days 2 through 5.  Further disposition pending results of labs. Discussed med's effects and SE's.   Over 15 minutes of exam, counseling, chart review, and critical decision making was performed.   Future Appointments  Date Time Provider [place]  02/17/2019 11:15 AM Vicie Mutters, PA-C [facility] None  09/20/2019  9:00 AM Vicie Mutters, PA-C [facility] None    ------------------------------------------------------------------------------------------------------------------   HPI BP 100/60   Pulse 80   Temp 97.7 F (36.5 C)   Ht 5\' 3"  (1.6 m)   Wt 154 lb (69.9 kg)   SpO2 98%   BMI 27.28 kg/m   63 y.o.female presents for evaluation of URI sx persisting for over 1 week. She reports she noted sore throat, weak, fatigued and achy, mild headaches for several days, lay in bed, had low grade fever the first several days. Then cough became worse, remains a dry cough, now having nasal drainage that seems to be triggering cough. She is significantly improved except for the cough, nasal drainage and fatigue.   She has been taking dayquil, nyquil, delsym in addition to daily typical allergy  medication cetirizine 10 mg daily. She reports cough is main issue at this time.   Her husband has similar symptoms and called yesterday, got prednisone and a zpak.   She did have the flu vaccine this season.   Past Medical History:  Diagnosis Date  . Allergic rhinitis   . Basal cell carcinoma   . Hyperlipidemia   . Thyroid disease   . Vitamin D deficiency      No Known Allergies  Current Outpatient Medications on File Prior to Visit  Medication Sig  . cetirizine (ZYRTEC) 10 MG tablet Take 10 mg by mouth daily.  . Cholecalciferol (VITAMIN D3) 125 MCG (5000 UT) CAPS Take by mouth daily.  Marland Kitchen levothyroxine (SYNTHROID) 50 MCG tablet Take 1 tablet (50 mcg total) by mouth daily.  . Multiple Vitamins-Minerals (ONE DAILY CALCIUM/IRON) TABS Take by mouth daily.  . rosuvastatin (CRESTOR) 10 MG tablet TAKE 1 TABLET BY MOUTH EVERY DAY  . HYDROcodone-acetaminophen (NORCO) 5-325 MG tablet Take 1 tablet by mouth every 6 (six) hours as needed for moderate pain.   No current facility-administered medications on file prior to visit.     ROS: all negative except above.   Physical Exam:  BP 100/60   Pulse 80   Temp 97.7 F (36.5 C)   Ht 5\' 3"  (1.6 m)   Wt 154 lb (69.9 kg)   SpO2 98%   BMI 27.28 kg/m   General Appearance: Well nourished, in no apparent distress. Eyes: PERRLA, EOMs, conjunctiva no swelling or erythema Sinuses: No Frontal/maxillary tenderness ENT/Mouth: Ext aud canals clear, TMs without erythema, bulging. No erythema, swelling, or exudate on post pharynx.  Tonsils not swollen or  erythematous. Hearing normal.  Neck: Supple, thyroid normal.  Respiratory: Respiratory effort normal, BS equal bilaterally without rales, rhonchi, wheezing or stridor.  Cardio: RRR with no MRGs. Brisk peripheral pulses without edema.  Abdomen: Soft, + BS.  Non tender. Lymphatics: Non tender without lymphadenopathy.  Musculoskeletal:  normal gait.  Skin: Warm, dry without rashes, lesions,  ecchymosis.  Neuro: Cranial nerves intact. Normal muscle tone, no cerebellar symptoms.  Psych: Awake and oriented X 3, normal affect, Insight and Judgment appropriate.     Izora Ribas, NP 10:12 AM [facility]

## 2019-01-06 NOTE — Patient Instructions (Signed)
Common causes of cough OR hoarseness OR sore throat:   Allergies, Viral Infections, Acid Reflux and Bacterial Infections.   Allergies and viral infections cause a cough OR sore throat by post nasal drip and are often worse at night, can also have sneezing, lower grade fevers, clear/yellow mucus. This is best treated with allergy medications or nasal sprays.  Please get on allegra for 1-2 weeks The strongest is allegra or fexafinadine  Cheapest at [employer], [employer], [employer]   Bacterial infections are more severe than allergies or viral infections with fever, teeth pain, fatigue. This can be treated with prednisone and the same over the counter medication and after 7 days can be treated with an antibiotic.   Silent reflux/GERD can cause a cough OR sore throat OR hoarseness WITHOUT heart burn because the esophagus that goes to the stomach and trachea that goes to the lungs are very close and when you lay down the acid can irritate your throat and lungs. This can cause hoarseness, cough, and wheezing. Please stop any alcohol or anti-inflammatories like aleve/advil/ibuprofen and start an over the counter Prilosec or omeprazole 1-2 times daily 30mins before food for 2 weeks, then switch to over the counter zantac/ratinidine or pepcid/famotadine once at night for 2 weeks.    sometimes irritation causes more irritation. Try voice rest, use sugar free cough drops to prevent coughing, and try to stop clearing your throat.   If you ever have a cough that does not go away after trying these things please make a follow up visit for further evaluation or we can refer you to a specialist. Or if you ever have shortness of breath or chest pain go to the ER.    

## 2019-02-17 ENCOUNTER — Ambulatory Visit: Payer: Self-pay | Admitting: Adult Health

## 2019-02-17 ENCOUNTER — Ambulatory Visit: Payer: Self-pay | Admitting: Physician Assistant

## 2019-02-22 NOTE — Progress Notes (Signed)
3 Month Follow Up  Assessment and Plan:  Tracy Brady was seen today for follow-up.  Diagnoses and all orders for this visit:  Essential hypertension - continue medications, DASH diet, exercise and monitor at home. Call if greater than 130/80.  -     CBC with Differential/Platelet -     COMPLETE METABOLIC PANEL WITH GFR  Hypothyroidism due to Hashimoto's thyroiditis Taking levothyroxine 34mcg daily continue medications the same pending lab results reminded to take on an empty stomach 30-2mins before food.  check TSH level Will contact with lab results  Hyperlipidemia, unspecified hyperlipidemia type Discussed dietary and exercise modification -     Lipid panel  Basal cell carcinoma (BCC), unspecified site Follows with dermatology  Vitamin D deficiency -     VITAMIN D 25 Hydroxy (Vit-D Deficiency) Continue supplementation  Abnormal glucose -     Hemoglobin A1c -     Insulin, random   Allergic rhinitis, unspecified seasonality, unspecified trigger Continue OTC meds, doing well at this time  BMI 25.0-25.9 Discussed dietary and exercise modifications Has lost total of 20lbs Doing well with [employer] Encouraged healthy behaviors Encouraged increase activities  Discussed hospital precautions with patient and agrees with plan of care.  We will contact you in 1-3 business days with lab results drawn today.  Follow up in 3 month(s) for routine management.  Call or return with new or worsening symptoms as discussed in appointment.  May contact office via phone [phone] or [place].   Continue diet and meds as discussed. Further disposition pending results of labs. Discussed med's effects and SE's.   Over 30 minutes of exam, counseling, chart review, and critical decision making was performed.    Future Appointments  Date Time Provider [place]  05/30/2019  8:45 AM Garnet Sierras, NP [facility] None  09/20/2019  9:00 AM Vicie Mutters, PA-C  [facility] None     HPI  63 y.o. female  presents for a follow up.   Her blood pressure has been controlled at home, today their BP is BP: 136/82.  She does workout. She denies chest pain, shortness of breath, dizziness.   She reports that she is going to [employer] and using the treadmill there.   She was treated for H pylori 02/11/2018, has not had any more heart burn or epigastric pain.    She has had some pain with her left shoulder, chronic.  One year ago cortisone shot that has helped.  Reports she is careful about lifting or pulling.  It is not painful but with some movements it is uncomfortable.  She is managing well at this time and does not want to pursue further at this time.  She is on cholesterol medication, crestor 10mg  and denies myalgias. Her cholesterol is at goal. The cholesterol last visit was:  Lab Results  Component Value Date   CHOL 138 09/12/2018   HDL 70 09/12/2018   LDLCALC 55 09/12/2018   TRIG 44 09/12/2018   CHOLHDL 2.0 09/12/2018  .  Last A1C in the office was:  Lab Results  Component Value Date   HGBA1C 5.0 07/27/2016   Patient is on Vitamin D supplement.   Lab Results  Component Value Date   VD25OH 33 09/12/2018      BMI is Body mass index is 25.86 kg/m., she is working on diet and exercise.  Wt Readings from Last 3 Encounters:  02/23/19 146 lb (66.2 kg)  01/06/19 154 lb (69.9 kg)  11/15/18 164 lb 6.4 oz (  74.6 kg)     Current Medications:  Current Outpatient Medications on File Prior to Visit  Medication Sig Dispense Refill  . cetirizine (ZYRTEC) 10 MG tablet Take 10 mg by mouth daily.    . Cholecalciferol (VITAMIN D3) 125 MCG (5000 UT) CAPS Take by mouth daily.    Marland Kitchen levothyroxine (SYNTHROID) 50 MCG tablet Take 1 tablet (50 mcg total) by mouth daily. 30 tablet 11  . Multiple Vitamins-Minerals (ONE DAILY CALCIUM/IRON) TABS Take by mouth daily.    . rosuvastatin (CRESTOR) 10 MG tablet TAKE 1 TABLET BY MOUTH EVERY DAY 90 tablet 1   No  current facility-administered medications on file prior to visit.     Medical History:  Past Medical History:  Diagnosis Date  . Allergic rhinitis   . Basal cell carcinoma   . Hyperlipidemia   . Thyroid disease   . Vitamin D deficiency    Allergies No Known Allergies  SURGICAL HISTORY She  has a past surgical history that includes Tympanoplasty (Left). FAMILY HISTORY Her family history includes Heart attack in her mother; Heart disease in her mother; Hyperlipidemia in her mother; Hypertension in her mother. SOCIAL HISTORY She  reports that she has never smoked. She has never used smokeless tobacco. She reports that she does not drink alcohol or use drugs.   Colonoscopy: 2011, due 2021 PAP: Yearly, Dr Marvel Plan MAMMOGRAM: 2019   Review of Systems: Review of Systems  Constitutional: Negative for chills, fever and malaise/fatigue.  HENT: Negative for congestion, ear pain and sore throat.   Eyes: Negative.   Respiratory: Negative for cough, shortness of breath and wheezing.   Cardiovascular: Negative for chest pain, palpitations and leg swelling.  Gastrointestinal: Negative for abdominal pain, blood in stool, constipation, diarrhea, heartburn and melena.  Genitourinary: Negative.   Musculoskeletal: Positive for myalgias (left shoulder). Negative for back pain, falls, joint pain and neck pain.  Skin: Negative.   Neurological: Negative for dizziness, sensory change, loss of consciousness and headaches.  Psychiatric/Behavioral: Negative for depression. The patient is not nervous/anxious and does not have insomnia.     Physical Exam: Estimated body mass index is 25.86 kg/m as calculated from the following:   Height as of this encounter: 5\' 3"  (1.6 m).   Weight as of this encounter: 146 lb (66.2 kg). BP 136/82   Pulse 76   Temp (!) 97 F (36.1 C)   Resp 16   Ht 5\' 3"  (1.6 m)   Wt 146 lb (66.2 kg)   BMI 25.86 kg/m   General Appearance: Well nourished well developed, in  no apparent distress.  Eyes: PERRLA, EOMs, conjunctiva no swelling or erythema ENT/Mouth: Ear canals normal without obstruction, swelling, erythema, or discharge.  TMs normal bilaterally with no erythema, bulging, retraction, or loss of landmark.  Oropharynx moist and clear with no exudate, erythema, or swelling.   Neck: Supple, thyroid normal. No bruits.  No cervical adenopathy Respiratory: Respiratory effort normal, Breath sounds clear A&P without wheeze, rhonchi, rales.   Cardio: RRR without murmurs, rubs or gallops. Brisk peripheral pulses without edema.  Chest: symmetric, with normal excursions Abdomen: Soft, nontender, no guarding, rebound, hernias, masses, or organomegaly.  Lymphatics: Non tender without lymphadenopathy.  Musculoskeletal: Full ROM all peripheral extremities,5/5 strength, and normal gait.  Skin: Warm, dry without rashes, lesions, ecchymosis. Neuro: Awake and oriented X 3, Cranial nerves intact, reflexes equal bilaterally. Normal muscle tone, no cerebellar symptoms. Sensation intact.  Psych:  normal affect, Insight and Judgment appropriate.  Garnet Sierras, NP [facility] 02/23/2019  12:34 PM

## 2019-02-23 ENCOUNTER — Ambulatory Visit: Payer: 59 | Admitting: Adult Health Nurse Practitioner

## 2019-02-23 ENCOUNTER — Encounter: Payer: Self-pay | Admitting: Adult Health Nurse Practitioner

## 2019-02-23 ENCOUNTER — Other Ambulatory Visit: Payer: Self-pay

## 2019-02-23 VITALS — BP 136/82 | HR 76 | Temp 97.0°F | Resp 16 | Ht 63.0 in | Wt 146.0 lb

## 2019-02-23 DIAGNOSIS — E038 Other specified hypothyroidism: Secondary | ICD-10-CM

## 2019-02-23 DIAGNOSIS — C4491 Basal cell carcinoma of skin, unspecified: Secondary | ICD-10-CM

## 2019-02-23 DIAGNOSIS — E063 Autoimmune thyroiditis: Secondary | ICD-10-CM

## 2019-02-23 DIAGNOSIS — I10 Essential (primary) hypertension: Secondary | ICD-10-CM

## 2019-02-23 DIAGNOSIS — E785 Hyperlipidemia, unspecified: Secondary | ICD-10-CM | POA: Diagnosis not present

## 2019-02-23 DIAGNOSIS — J309 Allergic rhinitis, unspecified: Secondary | ICD-10-CM

## 2019-02-23 DIAGNOSIS — E559 Vitamin D deficiency, unspecified: Secondary | ICD-10-CM

## 2019-02-23 DIAGNOSIS — Z6825 Body mass index (BMI) 25.0-25.9, adult: Secondary | ICD-10-CM

## 2019-02-23 DIAGNOSIS — R7309 Other abnormal glucose: Secondary | ICD-10-CM | POA: Insufficient documentation

## 2019-02-23 NOTE — Patient Instructions (Addendum)
Preventive Care for Adults  A healthy lifestyle and preventive care can promote health and wellness. Preventive health guidelines for women include the following key practices.  A routine yearly physical is a good way to check with your health care provider about your health and preventive screening. It is a chance to share any concerns and updates on your health and to receive a thorough exam.  Visit your dentist for a routine exam and preventive care every 6 months. Brush your teeth twice a day and floss once a day. Good oral hygiene prevents tooth decay and gum disease.  The frequency of eye exams is based on your age, health, family medical history, use of contact lenses, and other factors. Follow your health care provider's recommendations for frequency of eye exams.  Eat a healthy diet. Foods like vegetables, fruits, whole grains, low-fat dairy products, and lean protein foods contain the nutrients you need without too many calories. Decrease your intake of foods high in solid fats, added sugars, and salt. Eat the right amount of calories for you. Get information about a proper diet from your health care provider, if necessary.  Regular physical exercise is one of the most important things you can do for your health. Most adults should get at least 150 minutes of moderate-intensity exercise (any activity that increases your heart rate and causes you to sweat) each week. In addition, most adults need muscle-strengthening exercises on 2 or more days a week.  Maintain a healthy weight. The body mass index (BMI) is a screening tool to identify possible weight problems. It provides an estimate of body fat based on height and weight. Your health care provider can find your BMI and can help you achieve or maintain a healthy weight. For adults 20 years and older:  A BMI below 18.5 is considered underweight.  A BMI of 18.5 to 24.9 is normal.  A BMI of 25 to 29.9 is considered overweight.  A BMI of  30 and above is considered obese.  Maintain normal blood lipids and cholesterol levels by exercising and minimizing your intake of saturated fat. Eat a balanced diet with plenty of fruit and vegetables. Blood tests for lipids and cholesterol should begin at age 20 and be repeated every 5 years. If your lipid or cholesterol levels are high, you are over 50, or you are at high risk for heart disease, you may need your cholesterol levels checked more frequently. Ongoing high lipid and cholesterol levels should be treated with medicines if diet and exercise are not working.  If you smoke, find out from your health care provider how to quit. If you do not use tobacco, do not start.  Lung cancer screening is recommended for adults aged 55-80 years who are at high risk for developing lung cancer because of a history of smoking. A yearly low-dose CT scan of the lungs is recommended for people who have at least a 30-pack-year history of smoking and are a current smoker or have quit within the past 15 years. A pack year of smoking is smoking an average of 1 pack of cigarettes a day for 1 year (for example: 1 pack a day for 30 years or 2 packs a day for 15 years). Yearly screening should continue until the smoker has stopped smoking for at least 15 years. Yearly screening should be stopped for people who develop a health problem that would prevent them from having lung cancer treatment.  High blood pressure causes heart disease and increases   the risk of stroke. Your blood pressure should be checked at least every 1 to 2 years. Ongoing high blood pressure should be treated with medicines if weight loss and exercise do not work.  If you are 55-79 years old, ask your health care provider if you should take aspirin to prevent strokes.  Diabetes screening involves taking a blood sample to check your fasting blood sugar level. This should be done once every 3 years, after age 45, if you are within normal weight and  without risk factors for diabetes. Testing should be considered at a younger age or be carried out more frequently if you are overweight and have at least 1 risk factor for diabetes.  Breast cancer screening is essential preventive care for women. You should practice "breast self-awareness." This means understanding the normal appearance and feel of your breasts and may include breast self-examination. Any changes detected, no matter how small, should be reported to a health care provider. Women in their 20s and 30s should have a clinical breast exam (CBE) by a health care provider as part of a regular health exam every 1 to 3 years. After age 40, women should have a CBE every year. Starting at age 40, women should consider having a mammogram (breast X-ray test) every year. Women who have a family history of breast cancer should talk to their health care provider about genetic screening. Women at a high risk of breast cancer should talk to their health care providers about having an MRI and a mammogram every year.  Breast cancer gene (BRCA)-related cancer risk assessment is recommended for women who have family members with BRCA-related cancers. BRCA-related cancers include breast, ovarian, tubal, and peritoneal cancers. Having family members with these cancers may be associated with an increased risk for harmful changes (mutations) in the breast cancer genes BRCA1 and BRCA2. Results of the assessment will determine the need for genetic counseling and BRCA1 and BRCA2 testing.  Routine pelvic exams to screen for cancer are no longer recommended for nonpregnant women who are considered low risk for cancer of the pelvic organs (ovaries, uterus, and vagina) and who do not have symptoms. Ask your health care provider if a screening pelvic exam is right for you.  If you have had past treatment for cervical cancer or a condition that could lead to cancer, you need Pap tests and screening for cancer for at least 20  years after your treatment. If Pap tests have been discontinued, your risk factors (such as having a new sexual partner) need to be reassessed to determine if screening should be resumed. Some women have medical problems that increase the chance of getting cervical cancer. In these cases, your health care provider may recommend more frequent screening and Pap tests.  Colorectal cancer can be detected and often prevented. Most routine colorectal cancer screening begins at the age of 50 years and continues through age 75 years. However, your health care provider may recommend screening at an earlier age if you have risk factors for colon cancer. On a yearly basis, your health care provider may provide home test kits to check for hidden blood in the stool. Use of a small camera at the end of a tube, to directly examine the colon (sigmoidoscopy or colonoscopy), can detect the earliest forms of colorectal cancer. Talk to your health care provider about this at age 50, when routine screening begins.  Direct exam of the colon should be repeated every 5-10 years through age 75 years, unless   early forms of pre-cancerous polyps or small growths are found.  Hepatitis C blood testing is recommended for all people born from 1945 through 1965 and any individual with known risks for hepatitis C.  Pra  Osteoporosis is a disease in which the bones lose minerals and strength with aging. This can result in serious bone fractures or breaks. The risk of osteoporosis can be identified using a bone density scan. Women ages 65 years and over and women at risk for fractures or osteoporosis should discuss screening with their health care providers. Ask your health care provider whether you should take a calcium supplement or vitamin D to reduce the rate of osteoporosis.  Menopause can be associated with physical symptoms and risks. Hormone replacement therapy is available to decrease symptoms and risks. You should talk to your  health care provider about whether hormone replacement therapy is right for you.  Use sunscreen. Apply sunscreen liberally and repeatedly throughout the day. You should seek shade when your shadow is shorter than you. Protect yourself by wearing long sleeves, pants, a wide-brimmed hat, and sunglasses year round, whenever you are outdoors.  Once a month, do a whole body skin exam, using a mirror to look at the skin on your back. Tell your health care provider of new moles, moles that have irregular borders, moles that are larger than a pencil eraser, or moles that have changed in shape or color.  Stay current with required vaccines (immunizations).  Influenza vaccine. All adults should be immunized every year.  Tetanus, diphtheria, and acellular pertussis (Td, Tdap) vaccine. Pregnant women should receive 1 dose of Tdap vaccine during each pregnancy. The dose should be obtained regardless of the length of time since the last dose. Immunization is preferred during the 27th-36th week of gestation. An adult who has not previously received Tdap or who does not know her vaccine status should receive 1 dose of Tdap. This initial dose should be followed by tetanus and diphtheria toxoids (Td) booster doses every 10 years. Adults with an unknown or incomplete history of completing a 3-dose immunization series with Td-containing vaccines should begin or complete a primary immunization series including a Tdap dose. Adults should receive a Td booster every 10 years.  Varicella vaccine. An adult without evidence of immunity to varicella should receive 2 doses or a second dose if she has previously received 1 dose. Pregnant females who do not have evidence of immunity should receive the first dose after pregnancy. This first dose should be obtained before leaving the health care facility. The second dose should be obtained 4-8 weeks after the first dose.  Human papillomavirus (HPV) vaccine. Females aged 13-26 years  who have not received the vaccine previously should obtain the 3-dose series. The vaccine is not recommended for use in pregnant females. However, pregnancy testing is not needed before receiving a dose. If a female is found to be pregnant after receiving a dose, no treatment is needed. In that case, the remaining doses should be delayed until after the pregnancy. Immunization is recommended for any person with an immunocompromised condition through the age of 26 years if she did not get any or all doses earlier. During the 3-dose series, the second dose should be obtained 4-8 weeks after the first dose. The third dose should be obtained 24 weeks after the first dose and 16 weeks after the second dose.  Zoster vaccine. One dose is recommended for adults aged 60 years or older unless certain conditions are present.    Measles, mumps, and rubella (MMR) vaccine. Adults born before 1957 generally are considered immune to measles and mumps. Adults born in 1957 or later should have 1 or more doses of MMR vaccine unless there is a contraindication to the vaccine or there is laboratory evidence of immunity to each of the three diseases. A routine second dose of MMR vaccine should be obtained at least 28 days after the first dose for students attending postsecondary schools, health care workers, or international travelers. People who received inactivated measles vaccine or an unknown type of measles vaccine during 1963-1967 should receive 2 doses of MMR vaccine. People who received inactivated mumps vaccine or an unknown type of mumps vaccine before 1979 and are at high risk for mumps infection should consider immunization with 2 doses of MMR vaccine. For females of childbearing age, rubella immunity should be determined. If there is no evidence of immunity, females who are not pregnant should be vaccinated. If there is no evidence of immunity, females who are pregnant should delay immunization until after pregnancy.  Unvaccinated health care workers born before 1957 who lack laboratory evidence of measles, mumps, or rubella immunity or laboratory confirmation of disease should consider measles and mumps immunization with 2 doses of MMR vaccine or rubella immunization with 1 dose of MMR vaccine.  Pneumococcal 13-valent conjugate (PCV13) vaccine. When indicated, a person who is uncertain of her immunization history and has no record of immunization should receive the PCV13 vaccine. An adult aged 19 years or older who has certain medical conditions and has not been previously immunized should receive 1 dose of PCV13 vaccine. This PCV13 should be followed with a dose of pneumococcal polysaccharide (PPSV23) vaccine. The PPSV23 vaccine dose should be obtained at least 1 or more year(s) after the dose of PCV13 vaccine. An adult aged 19 years or older who has certain medical conditions and previously received 1 or more doses of PPSV23 vaccine should receive 1 dose of PCV13. The PCV13 vaccine dose should be obtained 1 or more years after the last PPSV23 vaccine dose.    Pneumococcal polysaccharide (PPSV23) vaccine. When PCV13 is also indicated, PCV13 should be obtained first. All adults aged 65 years and older should be immunized. An adult younger than age 65 years who has certain medical conditions should be immunized. Any person who resides in a nursing home or long-term care facility should be immunized. An adult smoker should be immunized. People with an immunocompromised condition and certain other conditions should receive both PCV13 and PPSV23 vaccines. People with human immunodeficiency virus (HIV) infection should be immunized as soon as possible after diagnosis. Immunization during chemotherapy or radiation therapy should be avoided. Routine use of PPSV23 vaccine is not recommended for American Indians, [place] Natives, or people younger than 65 years unless there are medical conditions that require PPSV23 vaccine. When  indicated, people who have unknown immunization and have no record of immunization should receive PPSV23 vaccine. One-time revaccination 5 years after the first dose of PPSV23 is recommended for people aged 19-64 years who have chronic kidney failure, nephrotic syndrome, asplenia, or immunocompromised conditions. People who received 1-2 doses of PPSV23 before age 65 years should receive another dose of PPSV23 vaccine at age 65 years or later if at least 5 years have passed since the previous dose. Doses of PPSV23 are not needed for people immunized with PPSV23 at or after age 65 years.  Preventive Services / Frequency   Ages 40 to 64 years  Blood pressure check.    Lipid and cholesterol check.  Lung cancer screening. / Every year if you are aged 55-80 years and have a 30-pack-year history of smoking and currently smoke or have quit within the past 15 years. Yearly screening is stopped once you have quit smoking for at least 15 years or develop a health problem that would prevent you from having lung cancer treatment.  Clinical breast exam.** / Every year after age 40 years.   BRCA-related cancer risk assessment.** / For women who have family members with a BRCA-related cancer (breast, ovarian, tubal, or peritoneal cancers).  Mammogram.** / Every year beginning at age 40 years and continuing for as long as you are in good health. Consult with your health care provider.  Pap test.** / Every 3 years starting at age 30 years through age 65 or 70 years with a history of 3 consecutive normal Pap tests.  HPV screening.** / Every 3 years from ages 30 years through ages 65 to 70 years with a history of 3 consecutive normal Pap tests.  Fecal occult blood test (FOBT) of stool. / Every year beginning at age 50 years and continuing until age 75 years. You may not need to do this test if you get a colonoscopy every 10 years.  Flexible sigmoidoscopy or colonoscopy.** / Every 5 years for a flexible  sigmoidoscopy or every 10 years for a colonoscopy beginning at age 50 years and continuing until age 75 years.  Hepatitis C blood test.** / For all people born from 1945 through 1965 and any individual with known risks for hepatitis C.  Skin self-exam. / Monthly.  Influenza vaccine. / Every year.  Tetanus, diphtheria, and acellular pertussis (Tdap/Td) vaccine.** / Consult your health care provider. Pregnant women should receive 1 dose of Tdap vaccine during each pregnancy. 1 dose of Td every 10 years.  Varicella vaccine.** / Consult your health care provider. Pregnant females who do not have evidence of immunity should receive the first dose after pregnancy.  Zoster vaccine.** / 1 dose for adults aged 60 years or older.  Pneumococcal 13-valent conjugate (PCV13) vaccine.** / Consult your health care provider.  Pneumococcal polysaccharide (PPSV23) vaccine.** / 1 to 2 doses if you smoke cigarettes or if you have certain conditions.  Meningococcal vaccine.** / Consult your health care provider.  Hepatitis A vaccine.** / Consult your health care provider.  Hepatitis B vaccine.** / Consult your health care provider. Screening for abdominal aortic aneurysm (AAA)  by ultrasound is recommended for people over 50 who have history of high blood pressure or who are current or former smokers. ++++++++++++++++++ Recommend Adult Low Dose Aspirin or  coated  Aspirin 81 mg daily  To reduce risk of Colon Cancer 20 %,  Skin Cancer 26 % ,  Melanoma 46%  and  Pancreatic cancer 60% +++++++++++++++++++ Vitamin D goal  is between 70-100.  Please make sure that you are taking your Vitamin D as directed.  It is very important as a natural anti-inflammatory  helping hair, skin, and nails, as well as reducing stroke and heart attack risk.  It helps your bones and helps with mood. It also decreases numerous cancer risks so please take it as directed.  Low Vit D is associated with a 200-300% higher risk  for CANCER  and 200-300% higher risk for HEART   ATTACK  &  STROKE.   ...................................... It is also associated with higher death rate at younger ages,  autoimmune diseases like Rheumatoid arthritis, Lupus, Multiple Sclerosis.      Also many other serious conditions, like depression, Alzheimer's Dementia, infertility, muscle aches, fatigue, fibromyalgia - just to name a few. ++++++++++++++++++ Recommend the book "The END of DIETING" by Dr Joel Fuhrman  & the book "The END of DIABETES " by Dr Joel Fuhrman At [employer] - get book & Audio CD's    Being diabetic has a  300% increased risk for heart attack, stroke, cancer, and alzheimer- type vascular dementia. It is very important that you work harder with diet by avoiding all foods that are white. Avoid white rice (brown & wild rice is OK), white potatoes (sweetpotatoes in moderation is OK), White bread or wheat bread or anything made out of white flour like bagels, donuts, rolls, buns, biscuits, cakes, pastries, cookies, pizza crust, and pasta (made from white flour & egg whites) - vegetarian pasta or spinach or wheat pasta is OK. Multigrain breads like Arnold's or Pepper[employer], or multigrain sandwich thins or flatbreads.  Diet, exercise and weight loss can reverse and cure diabetes in the early stages.  Diet, exercise and weight loss is very important in the control and prevention of complications of diabetes which affects every system in your body, ie. Brain - dementia/stroke, eyes - glaucoma/blindness, heart - heart attack/heart failure, kidneys - dialysis, stomach - gastric paralysis, intestines - malabsorption, nerves - severe painful neuritis, circulation - gangrene & loss of a leg(s), and finally cancer and Alzheimers.    I recommend avoid fried & greasy foods,  sweets/candy, white rice (brown or wild rice or Quinoa is OK), white potatoes (sweet potatoes are OK) - anything made from white flour - bagels, doughnuts, rolls, buns,  biscuits,white and wheat breads, pizza crust and traditional pasta made of white flour & egg white(vegetarian pasta or spinach or wheat pasta is OK).  Multi-grain bread is OK - like multi-grain flat bread or sandwich thins. Avoid alcohol in excess. Exercise is also important.    Eat all the vegetables you want - avoid meat, especially red meat and dairy - especially cheese.  Cheese is the most concentrated form of trans-fats which is the worst thing to clog up our arteries. Veggie cheese is OK which can be found in the fresh produce section at [employer] or [employer] or [employer]  ++++++++++++++++++++++ DASH Eating Plan  DASH stands for "Dietary Approaches to Stop Hypertension."   The DASH eating plan is a healthy eating plan that has been shown to reduce high blood pressure (hypertension). Additional health benefits may include reducing the risk of type 2 diabetes mellitus, heart disease, and stroke. The DASH eating plan may also help with weight loss. WHAT DO I NEED TO KNOW ABOUT THE DASH EATING PLAN? For the DASH eating plan, you will follow these general guidelines:  Choose foods with a percent daily value for sodium of less than 5% (as listed on the food label).  Use salt-free seasonings or herbs instead of table salt or sea salt.  Check with your health care provider or pharmacist before using salt substitutes.  Eat lower-sodium products, often labeled as "lower sodium" or "no salt added."  Eat fresh foods.  Eat more vegetables, fruits, and low-fat dairy products.  Choose whole grains. Look for the word "whole" as the first word in the ingredient list.  Choose fish   Limit sweets, desserts, sugars, and sugary drinks.  Choose heart-healthy fats.  Eat veggie cheese   Eat more home-cooked food and less restaurant, buffet, and fast food.  Limit fried foods.  Cook foods using   methods other than frying.  Limit canned vegetables. If you do use them, rinse them well to  decrease the sodium.  When eating at a restaurant, ask that your food be prepared with less salt, or no salt if possible.                      WHAT FOODS CAN I EAT? Read Dr Joel Fuhrman's books on The End of Dieting & The End of Diabetes  Grains Whole grain or whole wheat bread. Brown rice. Whole grain or whole wheat pasta. Quinoa, bulgur, and whole grain cereals. Low-sodium cereals. Corn or whole wheat flour tortillas. Whole grain cornbread. Whole grain crackers. Low-sodium crackers.  Vegetables Fresh or frozen vegetables (raw, steamed, roasted, or grilled). Low-sodium or reduced-sodium tomato and vegetable juices. Low-sodium or reduced-sodium tomato sauce and paste. Low-sodium or reduced-sodium canned vegetables.   Fruits All fresh, canned (in natural juice), or frozen fruits.  Protein Products  All fish and seafood.  Dried beans, peas, or lentils. Unsalted nuts and seeds. Unsalted canned beans.  Dairy Low-fat dairy products, such as skim or 1% milk, 2% or reduced-fat cheeses, low-fat ricotta or cottage cheese, or plain low-fat yogurt. Low-sodium or reduced-sodium cheeses.  Fats and Oils Tub margarines without trans fats. Light or reduced-fat mayonnaise and salad dressings (reduced sodium). Avocado. Safflower, olive, or canola oils. Natural peanut or almond butter.  Other Unsalted popcorn and pretzels. The items listed above may not be a complete list of recommended foods or beverages. Contact your dietitian for more options.  ++++++++++++++++++  WHAT FOODS ARE NOT RECOMMENDED? Grains/ White flour or wheat flour White bread. White pasta. White rice. Refined cornbread. Bagels and croissants. Crackers that contain trans fat.  Vegetables  Creamed or fried vegetables. Vegetables in a . Regular canned vegetables. Regular canned tomato sauce and paste. Regular tomato and vegetable juices.  Fruits Dried fruits. Canned fruit in light or heavy syrup. Fruit juice.  Meat and Other  Protein Products Meat in general - RED meat & White meat.  Fatty cuts of meat. Ribs, chicken wings, all processed meats as bacon, sausage, bologna, salami, fatback, hot dogs, bratwurst and packaged luncheon meats.  Dairy Whole or 2% milk, cream, half-and-half, and cream cheese. Whole-fat or sweetened yogurt. Full-fat cheeses or blue cheese. Non-dairy creamers and whipped toppings. Processed cheese, cheese spreads, or cheese curds.  Condiments Onion and garlic salt, seasoned salt, table salt, and sea salt. Canned and packaged gravies. Worcestershire sauce. Tartar sauce. Barbecue sauce. Teriyaki sauce. Soy sauce, including reduced sodium. Steak sauce. Fish sauce. Oyster sauce. Cocktail sauce. Horseradish. Ketchup and mustard. Meat flavorings and tenderizers. Bouillon cubes. Hot sauce. Tabasco sauce. Marinades. Taco seasonings. Relishes.  Fats and Oils Butter, stick margarine, lard, shortening and bacon fat. Coconut, palm kernel, or palm oils. Regular salad dressings.  Pickles and olives. Salted popcorn and pretzels.  The items listed above may not be a complete list of foods and beverages to avoid.    

## 2019-02-24 LAB — CBC WITH DIFFERENTIAL/PLATELET
Absolute Monocytes: 344 {cells}/uL (ref 200–950)
Basophils Absolute: 80 cells/uL (ref 0–200)
Basophils Relative: 1.9 %
Eosinophils Absolute: 525 cells/uL — ABNORMAL HIGH (ref 15–500)
Eosinophils Relative: 12.5 %
HCT: 35.3 % (ref 35.0–45.0)
Hemoglobin: 11.9 g/dL (ref 11.7–15.5)
Lymphs Abs: 1512 cells/uL (ref 850–3900)
MCH: 32.4 pg (ref 27.0–33.0)
MCHC: 33.7 g/dL (ref 32.0–36.0)
MCV: 96.2 fL (ref 80.0–100.0)
MPV: 12.4 fL (ref 7.5–12.5)
Monocytes Relative: 8.2 %
Neutro Abs: 1739 cells/uL (ref 1500–7800)
Neutrophils Relative %: 41.4 %
Platelets: 129 10*3/uL — ABNORMAL LOW (ref 140–400)
RBC: 3.67 Million/uL — ABNORMAL LOW (ref 3.80–5.10)
RDW: 12.4 % (ref 11.0–15.0)
Total Lymphocyte: 36 %
WBC: 4.2 10*3/uL (ref 3.8–10.8)

## 2019-02-24 LAB — COMPLETE METABOLIC PANEL WITH GFR
AG Ratio: 1.5 (calc) (ref 1.0–2.5)
ALT: 12 U/L (ref 6–29)
AST: 17 U/L (ref 10–35)
Alkaline phosphatase (APISO): 50 U/L (ref 37–153)
BUN: 7 mg/dL (ref 7–25)
CO2: 26 mmol/L (ref 20–32)
Calcium: 9.2 mg/dL (ref 8.6–10.4)
Chloride: 110 mmol/L (ref 98–110)
Creat: 0.81 mg/dL (ref 0.50–0.99)
GFR, Est African American: 89 mL/min/{1.73_m2} (ref >=60)
GFR, Est Non African American: 77 mL/min/{1.73_m2} (ref >=60)
Globulin: 2.4 g/dL (calc) (ref 1.9–3.7)
Glucose, Bld: 74 mg/dL (ref 65–99)
Sodium: 142 mmol/L (ref 135–146)
Total Bilirubin: 0.4 mg/dL (ref 0.2–1.2)
Total Protein: 6.1 g/dL (ref 6.1–8.1)

## 2019-02-24 LAB — TSH: TSH: 0.11 mIU/L — ABNORMAL LOW (ref 0.40–4.50)

## 2019-02-24 LAB — INSULIN, RANDOM: Insulin: 2 u[IU]/mL

## 2019-02-24 LAB — HEMOGLOBIN A1C
Hgb A1c MFr Bld: 5.4 % of total Hgb (ref <5.7)
Mean Plasma Glucose: 108 (calc)
eAG (mmol/L): 6 (calc)

## 2019-02-24 LAB — LIPID PANEL
Cholesterol: 121 mg/dL (ref <200)
HDL: 48 mg/dL — ABNORMAL LOW (ref >=50)
LDL Cholesterol (Calc): 59 mg/dL (calc)
Non-HDL Cholesterol (Calc): 73 mg/dL (calc) (ref <130)
Total CHOL/HDL Ratio: 2.5 (calc) (ref <5.0)
Triglycerides: 56 mg/dL (ref <150)

## 2019-02-24 LAB — COMPLETE METABOLIC PANEL WITHOUT GFR
Albumin: 3.7 g/dL (ref 3.6–5.1)
Potassium: 4.1 mmol/L (ref 3.5–5.3)

## 2019-02-24 LAB — VITAMIN D 25 HYDROXY (VIT D DEFICIENCY, FRACTURES): Vit D, 25-Hydroxy: 68 ng/mL (ref 30–100)

## 2019-02-27 ENCOUNTER — Other Ambulatory Visit: Payer: Self-pay | Admitting: Adult Health Nurse Practitioner

## 2019-02-27 MED ORDER — LEVOTHYROXINE SODIUM 25 MCG PO TABS: 25.0000 ug | ORAL_TABLET | Freq: Every day | ORAL | 4 refills | Status: DC

## 2019-03-31 ENCOUNTER — Other Ambulatory Visit: Payer: Self-pay | Admitting: Adult Health

## 2019-04-18 ENCOUNTER — Other Ambulatory Visit: Payer: Self-pay

## 2019-04-18 ENCOUNTER — Other Ambulatory Visit: Payer: Medicare HMO

## 2019-04-18 DIAGNOSIS — E038 Other specified hypothyroidism: Secondary | ICD-10-CM | POA: Diagnosis not present

## 2019-04-18 DIAGNOSIS — E063 Autoimmune thyroiditis: Secondary | ICD-10-CM

## 2019-04-18 NOTE — Addendum Note (Signed)
Addended by: Eulis Canner on: 04/18/2019 08:49 AM   Modules accepted: Orders

## 2019-04-19 LAB — TSH: TSH: 0.98 mIU/L (ref 0.40–4.50)

## 2019-05-04 ENCOUNTER — Other Ambulatory Visit: Payer: Self-pay | Admitting: Internal Medicine

## 2019-05-04 DIAGNOSIS — M25512 Pain in left shoulder: Secondary | ICD-10-CM

## 2019-05-29 NOTE — Progress Notes (Signed)
3 Month Follow Up   Assessment and Plan:   Tracy Brady was seen today for follow-up.  Diagnoses and all orders for this visit:  Essential hypertension No medication at this time, controlled with lifestyle Monitor blood pressure at home; call if consistently over 130/80 Continue DASH diet.   Reminder to go to the ER if any CP, SOB, nausea, dizziness, severe HA, changes vision/speech, left arm numbness and tingling and jaw pain. -     CBC with Differential/Platelet -     COMPLETE METABOLIC PANEL WITH GFR -     Magnesium  Hyperlipidemia, unspecified hyperlipidemia type Cholesterol Continue medications: Crestor 10mg  nightly Continue low cholesterol diet and exercise.  Check lipid panel.  -     Lipid panel  Hypothyroidism due to Hashimoto's thyroiditis Hypothyroidism Taking levothyroxine 75mcg daily Reminder to take on an empty stomach 30-19mins before first meal of the day. No antacid medications for 4 hours. -     TSH  Seasonal allergic rhinitis, unspecified trigger Doing well at this time Taking zyrtec 10mg  daily  Basal cell carcinoma (BCC), unspecified site Continued monitoring  Vitamin D deficiency Taking 5,000IU daily -     VITAMIN D 25 Hydroxy (Vit-D Deficiency)  Decreased hearing of right ear Continued monitoring  Abnormal glucose Discussed dietary and exercise modifications -     Hemoglobin A1c -     Insulin, random  Medication management Continued  Need for vaccination with 13-polyvalent pneumococcal conjugate vaccine -     Pneumococcal conjugate vaccine 13-valent IM received today   Due for Welcome to Medicare appointment, schedule in 1-2 months.   Continue diet and meds as discussed. Further disposition pending results of labs. Discussed med's effects and SE's.   Over 30 minutes of exam, counseling, chart review, and critical decision making was performed.   Future Appointments  Date Time Provider [place]  06/30/2019  8:30 AM Garnet Sierras, NP [facility] None  09/20/2019  9:00 AM Vicie Mutters, PA-C [facility] None    ----------------------------------------------------------------------------------------------------------------------  HPI 63 y.o. female  presents for 3 month follow up on HTN, HLD, Hypothyroidism, history of prediabetes abnormal glucose, decreased hearing in right ear, weight and vitamin D deficiency.  She has no health concerns today and reports overall she is doing well.  Reports she is able to obtain her medications and necessities from grocery during [place] restrictions.  BMI is Body mass index is 22.36 kg/m., she has been working on diet and exercise. Wt Readings from Last 3 Encounters:  05/30/19 126 lb 3.2 oz (57.2 kg)  02/23/19 146 lb (66.2 kg)  01/06/19 154 lb (69.9 kg)    Her blood pressure has been controlled at home, today their BP is BP: 120/80  She does workout. She denies any cardiac symptoms, chest pains, palpitations, shortness of breath, dizziness or lower extremity edema.     She is on cholesterol medication Rosuvastatin and denies myalgias. Her cholesterol is at goal. The cholesterol last visit was:   Lab Results  Component Value Date   CHOL 121 02/23/2019   HDL 48 (L) 02/23/2019   LDLCALC 59 02/23/2019   TRIG 56 02/23/2019   CHOLHDL 2.5 02/23/2019    She has been working on diet and exercise for prediabetes, and denies increased appetite, nausea, paresthesia of the feet, polydipsia, polyuria, visual disturbances, vomiting and weight loss. Last A1C in the office was:  Lab Results  Component Value Date   HGBA1C 5.4 02/23/2019   Patient is on Vitamin D supplement.  Lab Results  Component Value Date   VD25OH 68 02/23/2019       Current Medications:  Current Outpatient Medications on File Prior to Visit  Medication Sig  . cetirizine (ZYRTEC) 10 MG tablet Take 10 mg by mouth daily.  . Cholecalciferol (VITAMIN D3) 125 MCG (5000 UT) CAPS Take by mouth daily.  Marland Kitchen  levothyroxine (SYNTHROID, LEVOTHROID) 25 MCG tablet Take 1 tablet (25 mcg total) by mouth daily before breakfast.  . Multiple Vitamins-Minerals (ONE DAILY CALCIUM/IRON) TABS Take by mouth daily.  . rosuvastatin (CRESTOR) 10 MG tablet TAKE 1 TABLET BY MOUTH EVERY DAY   No current facility-administered medications on file prior to visit.     Allergies:  No Known Allergies   Medical History:  Past Medical History:  Diagnosis Date  . Allergic rhinitis   . Basal cell carcinoma   . Hyperlipidemia   . Thyroid disease   . Vitamin D deficiency     Family history- Reviewed and unchanged   Social history- Reviewed and unchanged   Names of Other Physician/Practitioners you currently use: 1. [facility] here for primary care 2. Eye Exam: 2020 3. Dental Exam Due for 2020  Patient Care Team: Unk Pinto, MD as PCP - General (Internal Medicine) Maisie Fus, MD as Consulting Physician (Obstetrics and Gynecology) Leta Baptist, MD as Consulting Physician (Otolaryngology) Laurence Aly, OD (Optometry) Irene Shipper, MD as Consulting Physician (Gastroenterology) Signe Colt, MD as Referring Physician (Obstetrics and Gynecology) Danella Sensing, MD as Consulting Physician (Dermatology)   Screening Tests: Immunization History  Administered Date(s) Administered  . Influenza Inj Mdck Quad With Preservative 09/06/2017, 09/12/2018  . Influenza Whole 09/02/2013  . Influenza, Seasonal, Injecte, Preservative Fre 11/08/2015  . Influenza-Unspecified 07/29/2016  . PPD Test 04/24/2014  . Pneumococcal Conjugate-13 05/30/2019  . Pneumococcal Polysaccharide-23 04/13/2013  . Tdap 10/16/2008, 09/06/2017     Vaccinations: TD or Tdap: 2018  Influenza: Due for 2020  Pneumococcal: 2014 Prevnar13: Received today Shingles: Zostavax/Shingrix: Discussed with patient   Preventative Care: Last colonoscopy: 2011 Last mammogram: 07/2018 Due 08/2019 Last pap  smear/pelvic exam: 2020, Dr Marvel Plan DEXA: 07/2018 Hep C screening (1945-1965) : 08/2017 Non-reactive   Imaging: Chest X-ray: 03/2013 EKG: 08/2018 ECHO:    Review of Systems:  Review of Systems  Constitutional: Negative for chills, diaphoresis, fever, malaise/fatigue and weight loss.  HENT: Negative for congestion, ear discharge, ear pain, hearing loss, nosebleeds, sinus pain, sore throat and tinnitus.        Deceased hearing, Right  Eyes: Negative for blurred vision, double vision, photophobia, pain, discharge and redness.  Respiratory: Negative for cough, hemoptysis, sputum production, shortness of breath, wheezing and stridor.   Cardiovascular: Negative for chest pain, palpitations, orthopnea, claudication, leg swelling and PND.  Gastrointestinal: Negative for abdominal pain, blood in stool, constipation, diarrhea, heartburn, melena, nausea and vomiting.  Genitourinary: Negative for dysuria, flank pain, frequency, hematuria and urgency.  Musculoskeletal: Negative for back pain, falls, joint pain, myalgias and neck pain.  Skin: Negative for itching and rash.  Neurological: Negative for dizziness, tingling, tremors, sensory change, speech change, focal weakness, seizures, loss of consciousness, weakness and headaches.  Endo/Heme/Allergies: Negative for environmental allergies and polydipsia. Does not bruise/bleed easily.  Psychiatric/Behavioral: Negative for depression, hallucinations, memory loss, substance abuse and suicidal ideas. The patient is not nervous/anxious and does not have insomnia.       Physical Exam: BP 120/80   Pulse 69   Temp 97.9 F (36.6 C)  Ht 5\' 3"  (1.6 m)   Wt 126 lb 3.2 oz (57.2 kg)   SpO2 96%   BMI 22.36 kg/m  Wt Readings from Last 3 Encounters:  05/30/19 126 lb 3.2 oz (57.2 kg)  02/23/19 146 lb (66.2 kg)  01/06/19 154 lb (69.9 kg)   General Appearance: Well nourished, in no apparent distress. Eyes: PERRLA, EOMs, conjunctiva no swelling or  erythema Sinuses: No Frontal/maxillary tenderness ENT/Mouth: Ext aud canals clear, TMs without erythema, bulging. No erythema, swelling, or exudate on post pharynx.  Tonsils not swollen or erythematous. Hearing normal.  Neck: Supple, thyroid normal.  Respiratory: Respiratory effort normal, BS equal bilaterally without rales, rhonchi, wheezing or stridor.  Cardio: RRR with no MRGs. Brisk peripheral pulses without edema.  Abdomen: Soft, + BS.  Non tender, no guarding, rebound, hernias, masses. Lymphatics: Non tender without lymphadenopathy.  Musculoskeletal: Full ROM, 5/5 strength, Normal gait Skin: Warm, dry without rashes, lesions, ecchymosis.  Neuro: Cranial nerves intact. No cerebellar symptoms.  Psych: Awake and oriented X 3, normal affect, Insight and Judgment appropriate.    Garnet Sierras, NP [facility] 8:55 AM

## 2019-05-30 ENCOUNTER — Other Ambulatory Visit: Payer: Self-pay

## 2019-05-30 ENCOUNTER — Encounter: Payer: Self-pay | Admitting: Adult Health Nurse Practitioner

## 2019-05-30 ENCOUNTER — Ambulatory Visit: Payer: 59 | Admitting: Adult Health Nurse Practitioner

## 2019-05-30 ENCOUNTER — Ambulatory Visit (INDEPENDENT_AMBULATORY_CARE_PROVIDER_SITE_OTHER): Payer: Medicare HMO | Admitting: Adult Health Nurse Practitioner

## 2019-05-30 VITALS — BP 120/80 | HR 69 | Temp 97.9°F | Ht 63.0 in | Wt 126.2 lb

## 2019-05-30 DIAGNOSIS — I10 Essential (primary) hypertension: Secondary | ICD-10-CM

## 2019-05-30 DIAGNOSIS — E559 Vitamin D deficiency, unspecified: Secondary | ICD-10-CM

## 2019-05-30 DIAGNOSIS — C4491 Basal cell carcinoma of skin, unspecified: Secondary | ICD-10-CM | POA: Diagnosis not present

## 2019-05-30 DIAGNOSIS — H9191 Unspecified hearing loss, right ear: Secondary | ICD-10-CM

## 2019-05-30 DIAGNOSIS — E038 Other specified hypothyroidism: Secondary | ICD-10-CM | POA: Diagnosis not present

## 2019-05-30 DIAGNOSIS — J302 Other seasonal allergic rhinitis: Secondary | ICD-10-CM | POA: Diagnosis not present

## 2019-05-30 DIAGNOSIS — Z79899 Other long term (current) drug therapy: Secondary | ICD-10-CM

## 2019-05-30 DIAGNOSIS — Z23 Encounter for immunization: Secondary | ICD-10-CM

## 2019-05-30 DIAGNOSIS — R7309 Other abnormal glucose: Secondary | ICD-10-CM | POA: Diagnosis not present

## 2019-05-30 DIAGNOSIS — E063 Autoimmune thyroiditis: Secondary | ICD-10-CM | POA: Diagnosis not present

## 2019-05-30 DIAGNOSIS — E785 Hyperlipidemia, unspecified: Secondary | ICD-10-CM

## 2019-05-30 DIAGNOSIS — M2550 Pain in unspecified joint: Secondary | ICD-10-CM | POA: Diagnosis not present

## 2019-05-30 NOTE — Patient Instructions (Signed)
Schedule an appointment for Welcome to Medicare visit in August.  We will contact you in 1-3 days with your lab results via My Chart.  Health Maintenance  Topic Date Due  . Pap Smear  08/14/2018  . Pneumonia vaccines (1 of 2 - PCV13) Received: 05/30/2019, series complete  . Flu Shot  07/01/2019  . Mammogram  08/09/2019  . DEXA scan (bone density measurement)  08/08/2020  . Colon Cancer Screening  09/25/2020  . Tetanus Vaccine  09/07/2027  .  Hepatitis C: One time screening is recommended by Center for Disease Control  (CDC) for  adults born from [birth year] through 1967.   Completed  . HIV Screening  Completed   Here is general information about the vaccination you received today.  This went into the muscle, your arm weill be sore tonight and tomorrow.   Pneumococcal Conjugate Vaccine suspension for injection What is this medicine? PNEUMOCOCCAL VACCINE (NEU mo KOK al vak SEEN) is a vaccine used to prevent pneumococcus bacterial infections. These bacteria can cause serious infections like pneumonia, meningitis, and blood infections. This vaccine will lower your chance of getting pneumonia. If you do get pneumonia, it can make your symptoms milder and your illness shorter. This vaccine will not treat an infection and will not cause infection. This vaccine is recommended for infants and young children, adults with certain medical conditions, and adults 10 years or older. This medicine may be used for other purposes; ask your health care provider or pharmacist if you have questions. COMMON BRAND NAME(S): Prevnar, Prevnar 13 What should I tell my health care provider before I take this medicine? They need to know if you have any of these conditions:  bleeding problems  fever  immune system problems  an unusual or allergic reaction to pneumococcal vaccine, diphtheria toxoid, other vaccines, latex, other medicines, foods, dyes, or preservatives  pregnant or trying to get  pregnant  breast-feeding How should I use this medicine? This vaccine is for injection into a muscle. It is given by a health care professional. A copy of Vaccine Information Statements will be given before each vaccination. Read this sheet carefully each time. The sheet may change frequently. Talk to your pediatrician regarding the use of this medicine in children. While this drug may be prescribed for children as young as 45 weeks old for selected conditions, precautions do apply. Overdosage: If you think you have taken too much of this medicine contact a poison control center or emergency room at once. NOTE: This medicine is only for you. Do not share this medicine with others. What if I miss a dose? It is important not to miss your dose. Call your doctor or health care professional if you are unable to keep an appointment. What may interact with this medicine?  medicines for cancer chemotherapy  medicines that suppress your immune function  steroid medicines like prednisone or cortisone This list may not describe all possible interactions. Give your health care provider a list of all the medicines, herbs, non-prescription drugs, or dietary supplements you use. Also tell them if you smoke, drink alcohol, or use illegal drugs. Some items may interact with your medicine. What should I watch for while using this medicine? Mild fever and pain should go away in 3 days or less. Report any unusual symptoms to your doctor or health care professional. What side effects may I notice from receiving this medicine? Side effects that you should report to your doctor or health care professional as soon as  possible:  allergic reactions like skin rash, itching or hives, swelling of the face, lips, or tongue  breathing problems  confused  fast or irregular heartbeat  fever over 102 degrees F  seizures  unusual bleeding or bruising  unusual muscle weakness Side effects that usually do not  require medical attention (report to your doctor or health care professional if they continue or are bothersome):  aches and pains  diarrhea  fever of 102 degrees F or less  headache  irritable  loss of appetite  pain, tender at site where injected  trouble sleeping This list may not describe all possible side effects. Call your doctor for medical advice about side effects. You may report side effects to FDA at 1-800-FDA-1088. Where should I keep my medicine? This does not apply. This vaccine is given in a clinic, pharmacy, doctor's office, or other health care setting and will not be stored at home. NOTE: This sheet is a summary. It may not cover all possible information. If you have questions about this medicine, talk to your doctor, pharmacist, or health care provider.  2020 Elsevier/Gold Standard (2014-08-23 10:27:27)     - Vit D  And Vit C 1,000 mg  are recommended to help protect  against the Covid_19 and other Corona viruses.   - Also it's recommended to take Zinc 50 mg to help  protect against the Covid_19  And best place to get  is also on [employer].com and don't pay more than 6-8 cents /pill !   ================================ Coronavirus (COVID-19) Are you at risk?  Are you at risk for the Coronavirus (COVID-19)?  To be considered HIGH RISK for Coronavirus (COVID-19), you have to meet the following criteria:  . Traveled to [place], [place], [place], [place] or [place]; or in the [place] to [place], [place], [place]  . or [place]; and have fever, cough, and shortness of breath within the last 2 weeks of travel OR . Been in close contact with a person diagnosed with COVID-19 within the last 2 weeks and have  . fever, cough,and shortness of breath .  . IF YOU DO NOT MEET THESE CRITERIA, YOU ARE CONSIDERED LOW RISK FOR COVID-19.  What to do if you are HIGH RISK for COVID-19?  Marland Kitchen If you are having a medical emergency, call 911. . Seek medical care  right away. Before you go to a doctor's office, urgent care or emergency department, .  call ahead and tell them about your recent travel, contact with someone diagnosed with COVID-19  .  and your symptoms.  . You should receive instructions from your physician's office regarding next steps of care.  . When you arrive at healthcare provider, tell the healthcare staff immediately you have returned from  . visiting [place], [place], [place], [place] or [place]; or traveled in the [place] to [place], [place],  . [place] or [place] in the last two weeks or you have been in close contact with a person diagnosed with  . COVID-19 in the last 2 weeks.   . Tell the health care staff about your symptoms: fever, cough and shortness of breath. . After you have been seen by a medical provider, you will be either: o Tested for (COVID-19) and discharged home on quarantine except to seek medical care if  o symptoms worsen, and asked to  - Stay home and avoid contact with others until you get your results (4-5 days)  - Avoid travel on  public transportation if possible (such as bus, train, or airplane) or o Sent to the Emergency Department by EMS for evaluation, COVID-19 testing  and  o possible admission depending on your condition and test results.  What to do if you are LOW RISK for COVID-19?  Reduce your risk of any infection by using the same precautions used for avoiding the common cold or flu:  Marland Kitchen Wash your hands often with soap and warm water for at least 20 seconds.  If soap and water are not readily available,  . use an alcohol-based hand sanitizer with at least 60% alcohol.  . If coughing or sneezing, cover your mouth and nose by coughing or sneezing into the elbow areas of your shirt or coat, .  into a tissue or into your sleeve (not your hands). . Avoid shaking hands with others and consider head nods or verbal greetings only. . Avoid touching your eyes, nose, or mouth with unwashed  hands.  . Avoid close contact with people who are sick. . Avoid places or events with large numbers of people in one location, like concerts or sporting events. . Carefully consider travel plans you have or are making. . If you are planning any travel outside or inside the [place], visit the CDC's Travelers' Health webpage for the latest health notices. . If you have some symptoms but not all symptoms, continue to monitor at home and seek medical attention  . if your symptoms worsen. . If you are having a medical emergency, call 911.   . >>>>>>>>>>>>>>>>>>>>>>>>>>>>>>>>> . We Do NOT Approve of  [facility], [employer]  Our Patients  To Do Home Visits & We Do NOT Approve of [employer] > > > > > > > > > > > > > > > > > > > > > > > > > > > > > > > > > > > > > > >  Preventive Care for Adults  A healthy lifestyle and preventive care can promote health and wellness. Preventive health guidelines for women include the following key practices.  A routine yearly physical is a good way to check with your health care provider about your health and preventive screening. It is a chance to share any concerns and updates on your health and to receive a thorough exam.  Visit your dentist for a routine exam and preventive care every 6 months. Brush your teeth twice a day and floss once a day. Good oral hygiene prevents tooth decay and gum disease.  The frequency of eye exams is based on your age, health, family medical history, use of contact lenses, and other factors. Follow your health care provider's recommendations for frequency of eye exams.  Eat a healthy diet. Foods like vegetables, fruits, whole grains, low-fat dairy products, and lean protein foods contain the nutrients you need without too many calories. Decrease your intake of foods high in solid fats, added sugars, and salt. Eat the right amount of calories for you. Get information about a proper diet from your health care  provider, if necessary.  Regular physical exercise is one of the most important things you can do for your health. Most adults should get at least 150 minutes of moderate-intensity exercise (any activity that increases your heart rate and causes you to sweat) each week. In addition, most adults need muscle-strengthening exercises on 2 or more days a week.  Maintain a healthy weight. The body mass index (BMI) is a screening tool to identify  possible weight problems. It provides an estimate of body fat based on height and weight. Your health care provider can find your BMI and can help you achieve or maintain a healthy weight. For adults 20 years and older:  A BMI below 18.5 is considered underweight.  A BMI of 18.5 to 24.9 is normal.  A BMI of 25 to 29.9 is considered overweight.  A BMI of 30 and above is considered obese.  Maintain normal blood lipids and cholesterol levels by exercising and minimizing your intake of saturated fat. Eat a balanced diet with plenty of fruit and vegetables. If your lipid or cholesterol levels are high, you are over 50, or you are at high risk for heart disease, you may need your cholesterol levels checked more frequently. Ongoing high lipid and cholesterol levels should be treated with medicines if diet and exercise are not working.  If you smoke, find out from your health care provider how to quit. If you do not use tobacco, do not start.  Lung cancer screening is recommended for adults aged 6-80 years who are at high risk for developing lung cancer because of a history of smoking. A yearly low-dose CT scan of the lungs is recommended for people who have at least a 30-pack-year history of smoking and are a current smoker or have quit within the past 15 years. A pack year of smoking is smoking an average of 1 pack of cigarettes a day for 1 year (for example: 1 pack a day for 30 years or 2 packs a day for 15 years). Yearly screening should continue until the smoker  has stopped smoking for at least 15 years. Yearly screening should be stopped for people who develop a health problem that would prevent them from having lung cancer treatment.  Avoid use of street drugs. Do not share needles with anyone. Ask for help if you need support or instructions about stopping the use of drugs.  High blood pressure causes heart disease and increases the risk of stroke.  Ongoing high blood pressure should be treated with medicines if weight loss and exercise do not work.  If you are 38-14 years old, ask your health care provider if you should take aspirin to prevent strokes.  Diabetes screening involves taking a blood sample to check your fasting blood sugar level. This should be done once every 3 years, after age 66, if you are within normal weight and without risk factors for diabetes. Testing should be considered at a younger age or be carried out more frequently if you are overweight and have at least 1 risk factor for diabetes.  Breast cancer screening is essential preventive care for women. You should practice "breast self-awareness." This means understanding the normal appearance and feel of your breasts and may include breast self-examination. Any changes detected, no matter how small, should be reported to a health care provider. Women in their 32s and 30s should have a clinical breast exam (CBE) by a health care provider as part of a regular health exam every 1 to 3 years. After age 98, women should have a CBE every year. Starting at age 53, women should consider having a mammogram (breast X-ray test) every year. Women who have a family history of breast cancer should talk to their health care provider about genetic screening. Women at a high risk of breast cancer should talk to their health care providers about having an MRI and a mammogram every year.  Breast cancer gene (BRCA)-related cancer  risk assessment is recommended for women who have family members with  BRCA-related cancers. BRCA-related cancers include breast, ovarian, tubal, and peritoneal cancers. Having family members with these cancers may be associated with an increased risk for harmful changes (mutations) in the breast cancer genes BRCA1 and BRCA2. Results of the assessment will determine the need for genetic counseling and BRCA1 and BRCA2 testing.  Routine pelvic exams to screen for cancer are no longer recommended for nonpregnant women who are considered low risk for cancer of the pelvic organs (ovaries, uterus, and vagina) and who do not have symptoms. Ask your health care provider if a screening pelvic exam is right for you.  If you have had past treatment for cervical cancer or a condition that could lead to cancer, you need Pap tests and screening for cancer for at least 20 years after your treatment. If Pap tests have been discontinued, your risk factors (such as having a new sexual partner) need to be reassessed to determine if screening should be resumed. Some women have medical problems that increase the chance of getting cervical cancer. In these cases, your health care provider may recommend more frequent screening and Pap tests.    Colorectal cancer can be detected and often prevented. Most routine colorectal cancer screening begins at the age of 24 years and continues through age 82 years. However, your health care provider may recommend screening at an earlier age if you have risk factors for colon cancer. On a yearly basis, your health care provider may provide home test kits to check for hidden blood in the stool. Use of a small camera at the end of a tube, to directly examine the colon (sigmoidoscopy or colonoscopy), can detect the earliest forms of colorectal cancer. Talk to your health care provider about this at age 66, when routine screening begins.  Direct exam of the colon should be repeated every 5-10 years through age 59 years, unless early forms of pre-cancerous polyps or  small growths are found.  Osteoporosis is a disease in which the bones lose minerals and strength with aging. This can result in serious bone fractures or breaks. The risk of osteoporosis can be identified using a bone density scan. Women ages 28 years and over and women at risk for fractures or osteoporosis should discuss screening with their health care providers. Ask your health care provider whether you should take a calcium supplement or vitamin D to reduce the rate of osteoporosis.  Menopause can be associated with physical symptoms and risks. Hormone replacement therapy is available to decrease symptoms and risks. You should talk to your health care provider about whether hormone replacement therapy is right for you.  Use sunscreen. Apply sunscreen liberally and repeatedly throughout the day. You should seek shade when your shadow is shorter than you. Protect yourself by wearing long sleeves, pants, a wide-brimmed hat, and sunglasses year round, whenever you are outdoors.  Once a month, do a whole body skin exam, using a mirror to look at the skin on your back. Tell your health care provider of new moles, moles that have irregular borders, moles that are larger than a pencil eraser, or moles that have changed in shape or color.  Stay current with required vaccines (immunizations).  Influenza vaccine. All adults should be immunized every year.  Tetanus, diphtheria, and acellular pertussis (Td, Tdap) vaccine. Pregnant women should receive 1 dose of Tdap vaccine during each pregnancy. The dose should be obtained regardless of the length of time since  the last dose. Immunization is preferred during the 27th-36th week of gestation. An adult who has not previously received Tdap or who does not know her vaccine status should receive 1 dose of Tdap. This initial dose should be followed by tetanus and diphtheria toxoids (Td) booster doses every 10 years. Adults with an unknown or incomplete history of  completing a 3-dose immunization series with Td-containing vaccines should begin or complete a primary immunization series including a Tdap dose. Adults should receive a Td booster every 10 years.    Zoster vaccine. One dose is recommended for adults aged 37 years or older unless certain conditions are present.    Pneumococcal 13-valent conjugate (PCV13) vaccine. When indicated, a person who is uncertain of her immunization history and has no record of immunization should receive the PCV13 vaccine. An adult aged 69 years or older who has certain medical conditions and has not been previously immunized should receive 1 dose of PCV13 vaccine. This PCV13 should be followed with a dose of pneumococcal polysaccharide (PPSV23) vaccine. The PPSV23 vaccine dose should be obtained at least 1 or more year(s) after the dose of PCV13 vaccine. An adult aged 68 years or older who has certain medical conditions and previously received 1 or more doses of PPSV23 vaccine should receive 1 dose of PCV13. The PCV13 vaccine dose should be obtained 1 or more years after the last PPSV23 vaccine dose.    Pneumococcal polysaccharide (PPSV23) vaccine. When PCV13 is also indicated, PCV13 should be obtained first. All adults aged 36 years and older should be immunized. An adult younger than age 63 years who has certain medical conditions should be immunized. Any person who resides in a nursing home or long-term care facility should be immunized. An adult smoker should be immunized. People with an immunocompromised condition and certain other conditions should receive both PCV13 and PPSV23 vaccines. People with human immunodeficiency virus (HIV) infection should be immunized as soon as possible after diagnosis. Immunization during chemotherapy or radiation therapy should be avoided. Routine use of PPSV23 vaccine is not recommended for American Indians, [place] Natives, or people younger than 65 years unless there are medical  conditions that require PPSV23 vaccine. When indicated, people who have unknown immunization and have no record of immunization should receive PPSV23 vaccine. One-time revaccination 5 years after the first dose of PPSV23 is recommended for people aged 19-64 years who have chronic kidney failure, nephrotic syndrome, asplenia, or immunocompromised conditions. People who received 1-2 doses of PPSV23 before age 46 years should receive another dose of PPSV23 vaccine at age 18 years or later if at least 5 years have passed since the previous dose. Doses of PPSV23 are not needed for people immunized with PPSV23 at or after age 63 years.   Preventive Services / Frequency  Ages 17 years and over  Blood pressure check.  Lipid and cholesterol check.  Lung cancer screening. / Every year if you are aged 67-80 years and have a 30-pack-year history of smoking and currently smoke or have quit within the past 15 years. Yearly screening is stopped once you have quit smoking for at least 15 years or develop a health problem that would prevent you from having lung cancer treatment.  Clinical breast exam.** / Every year after age 64 years.   BRCA-related cancer risk assessment.** / For women who have family members with a BRCA-related cancer (breast, ovarian, tubal, or peritoneal cancers).  Mammogram.** / Every year beginning at age 41 years and continuing for as  long as you are in good health. Consult with your health care provider.  Pap test.** / Every 3 years starting at age 7 years through age 44 or 28 years with 3 consecutive normal Pap tests. Testing can be stopped between 65 and 70 years with 3 consecutive normal Pap tests and no abnormal Pap or HPV tests in the past 10 years.  Fecal occult blood test (FOBT) of stool. / Every year beginning at age 57 years and continuing until age 45 years. You may not need to do this test if you get a colonoscopy every 10 years.  Flexible sigmoidoscopy or colonoscopy.** /  Every 5 years for a flexible sigmoidoscopy or every 10 years for a colonoscopy beginning at age 79 years and continuing until age 74 years.  Hepatitis C blood test.** / For all people born from [birth year] through 1967 and any individual with known risks for hepatitis C.  Osteoporosis screening.** / A one-time screening for women ages 63 years and over and women at risk for fractures or osteoporosis.  Skin self-exam. / Monthly.  Influenza vaccine. / Every year.  Tetanus, diphtheria, and acellular pertussis (Tdap/Td) vaccine.** / 1 dose of Td every 10 years.  Zoster vaccine.** / 1 dose for adults aged 62 years or older.  Pneumococcal 13-valent conjugate (PCV13) vaccine.** / Consult your health care provider.  Pneumococcal polysaccharide (PPSV23) vaccine.** / 1 dose for all adults aged 66 years and older. Screening for abdominal aortic aneurysm (AAA)  by ultrasound is recommended for people who have history of high blood pressure or who are current or former smokers. ++++++++++++++++++++ Recommend Adult Low Dose Aspirin or  coated  Aspirin 81 mg daily  To reduce risk of Colon Cancer 40 %,  Skin Cancer 26 % ,  Melanoma 46%  and  Pancreatic cancer 60% ++++++++++++++++++++ Vitamin D goal  is between 70-100.  Please make sure that you are taking your Vitamin D as directed.  It is very important as a natural anti-inflammatory  helping hair, skin, and nails, as well as reducing stroke and heart attack risk.  It helps your bones and helps with mood. It also decreases numerous cancer risks so please take it as directed.  Low Vit D is associated with a 200-300% higher risk for CANCER  and 200-300% higher risk for HEART   ATTACK  &  STROKE.   .....................................Marland Kitchen It is also associated with higher death rate at younger ages,  autoimmune diseases like Rheumatoid arthritis, Lupus, Multiple Sclerosis.    Also many other serious conditions, like depression, Alzheimer's Dementia,  infertility, muscle aches, fatigue, fibromyalgia - just to name a few. ++++++++++++++++++ Recommend the book "The END of DIETING" by Dr Excell Seltzer  & the book "The END of DIABETES " by Dr Excell Seltzer At [email or web address] - get book & Audio CD's    Being diabetic has a  300% increased risk for heart attack, stroke, cancer, and alzheimer- type vascular dementia. It is very important that you work harder with diet by avoiding all foods that are white. Avoid white rice (brown & wild rice is OK), white potatoes (sweetpotatoes in moderation is OK), White bread or wheat bread or anything made out of white flour like bagels, donuts, rolls, buns, biscuits, cakes, pastries, cookies, pizza crust, and pasta (made from white flour & egg whites) - vegetarian pasta or spinach or wheat pasta is OK. Multigrain breads like Arnold's or [place], or multigrain sandwich thins or flatbreads.  Diet, exercise and weight loss  can reverse and cure diabetes in the early stages.  Diet, exercise and weight loss is very important in the control and prevention of complications of diabetes which affects every system in your body, ie. Brain - dementia/stroke, eyes - glaucoma/blindness, heart - heart attack/heart failure, kidneys - dialysis, stomach - gastric paralysis, intestines - malabsorption, nerves - severe painful neuritis, circulation - gangrene & loss of a leg(s), and finally cancer and Alzheimers.    I recommend avoid fried & greasy foods,  sweets/candy, white rice (brown or wild rice or Quinoa is OK), white potatoes (sweet potatoes are OK) - anything made from white flour - bagels, doughnuts, rolls, buns, biscuits,white and wheat breads, pizza crust and traditional pasta made of white flour & egg white(vegetarian pasta or spinach or wheat pasta is OK).  Multi-grain bread is OK - like multi-grain flat bread or sandwich thins. Avoid alcohol in excess. Exercise is also important.    Eat all the vegetables you want - avoid meat,  especially red meat and dairy - especially cheese.  Cheese is the most concentrated form of trans-fats which is the worst thing to clog up our arteries. Veggie cheese is OK which can be found in the fresh produce section at [employer] or [employer] or [employer]  +++++++++++++++++++ DASH Eating Plan  DASH stands for "Dietary Approaches to Stop Hypertension."   The DASH eating plan is a healthy eating plan that has been shown to reduce high blood pressure (hypertension). Additional health benefits may include reducing the risk of type 2 diabetes mellitus, heart disease, and stroke. The DASH eating plan may also help with weight loss. WHAT DO I NEED TO KNOW ABOUT THE DASH EATING PLAN? For the DASH eating plan, you will follow these general guidelines:  Choose foods with a percent daily value for sodium of less than 5% (as listed on the food label).  Use salt-free seasonings or herbs instead of table salt or sea salt.  Check with your health care provider or pharmacist before using salt substitutes.  Eat lower-sodium products, often labeled as "lower sodium" or "no salt added."  Eat fresh foods.  Eat more vegetables, fruits, and low-fat dairy products.  Choose whole grains. Look for the word "whole" as the first word in the ingredient list.  Choose fish   Limit sweets, desserts, sugars, and sugary drinks.  Choose heart-healthy fats.  Eat veggie cheese   Eat more home-cooked food and less restaurant, buffet, and fast food.  Limit fried foods.  Cook foods using methods other than frying.  Limit canned vegetables. If you do use them, rinse them well to decrease the sodium.  When eating at a restaurant, ask that your food be prepared with less salt, or no salt if possible.                      WHAT FOODS CAN I EAT? Read Dr Fara Olden Fuhrman's books on The End of Dieting & The End of Diabetes  Grains Whole grain or whole wheat bread. Brown rice. Whole grain or whole wheat pasta.  Quinoa, bulgur, and whole grain cereals. Low-sodium cereals. Corn or whole wheat flour tortillas. Whole grain cornbread. Whole grain crackers. Low-sodium crackers.  Vegetables Fresh or frozen vegetables (raw, steamed, roasted, or grilled). Low-sodium or reduced-sodium tomato and vegetable juices. Low-sodium or reduced-sodium tomato sauce and paste. Low-sodium or reduced-sodium canned vegetables.   Fruits All fresh, canned (in natural juice), or frozen fruits.  Protein Products  All fish  and seafood.  Dried beans, peas, or lentils. Unsalted nuts and seeds. Unsalted canned beans.  Dairy Low-fat dairy products, such as skim or 1% milk, 2% or reduced-fat cheeses, low-fat ricotta or cottage cheese, or plain low-fat yogurt. Low-sodium or reduced-sodium cheeses.  Fats and Oils Tub margarines without trans fats. Light or reduced-fat mayonnaise and salad dressings (reduced sodium). Avocado. Safflower, olive, or canola oils. Natural peanut or almond butter.  Other Unsalted popcorn and pretzels. The items listed above may not be a complete list of recommended foods or beverages. Contact your dietitian for more options.  +++++++++++++++  WHAT FOODS ARE NOT RECOMMENDED? Grains/ White flour or wheat flour White bread. White pasta. White rice. Refined cornbread. Bagels and croissants. Crackers that contain trans fat.  Vegetables  Creamed or fried vegetables. Vegetables in a . Regular canned vegetables. Regular canned tomato sauce and paste. Regular tomato and vegetable juices.  Fruits Dried fruits. Canned fruit in light or heavy syrup. Fruit juice.  Meat and Other Protein Products Meat in general - RED meat & White meat.  Fatty cuts of meat. Ribs, chicken wings, all processed meats as bacon, sausage, bologna, salami, fatback, hot dogs, bratwurst and packaged luncheon meats.  Dairy Whole or 2% milk, cream, half-and-half, and cream cheese. Whole-fat or sweetened yogurt. Full-fat cheeses or blue  cheese. Non-dairy creamers and whipped toppings. Processed cheese, cheese spreads, or cheese curds.  Condiments Onion and garlic salt, seasoned salt, table salt, and sea salt. Canned and packaged gravies. Worcestershire sauce. Tartar sauce. Barbecue sauce. Teriyaki sauce. Soy sauce, including reduced sodium. Steak sauce. Fish sauce. Oyster sauce. Cocktail sauce. Horseradish. Ketchup and mustard. Meat flavorings and tenderizers. Bouillon cubes. Hot sauce. Tabasco sauce. Marinades. Taco seasonings. Relishes.  Fats and Oils Butter, stick margarine, lard, shortening and bacon fat. Coconut, palm kernel, or palm oils. Regular salad dressings.  Pickles and olives. Salted popcorn and pretzels.  The items listed above may not be a complete list of foods and beverages to avoid.

## 2019-05-31 LAB — COMPLETE METABOLIC PANEL WITH GFR
AG Ratio: 1.6 (calc) (ref 1.0–2.5)
ALT: 13 U/L (ref 6–29)
AST: 19 U/L (ref 10–35)
Albumin: 3.9 g/dL (ref 3.6–5.1)
Alkaline phosphatase (APISO): 57 U/L (ref 37–153)
BUN/Creatinine Ratio: 12 (calc) (ref 6–22)
BUN: 12 mg/dL (ref 7–25)
CO2: 30 mmol/L (ref 20–32)
Calcium: 9.4 mg/dL (ref 8.6–10.4)
Chloride: 106 mmol/L (ref 98–110)
Creat: 1.03 mg/dL — ABNORMAL HIGH (ref 0.50–0.99)
GFR, Est African American: 66 mL/min/{1.73_m2} (ref >=60)
GFR, Est Non African American: 57 mL/min/{1.73_m2} — ABNORMAL LOW (ref >=60)
Globulin: 2.4 g/dL (calc) (ref 1.9–3.7)
Glucose, Bld: 81 mg/dL (ref 65–99)
Potassium: 4.2 mmol/L (ref 3.5–5.3)
Sodium: 142 mmol/L (ref 135–146)
Total Bilirubin: 0.4 mg/dL (ref 0.2–1.2)
Total Protein: 6.3 g/dL (ref 6.1–8.1)

## 2019-05-31 LAB — LIPID PANEL
Cholesterol: 142 mg/dL (ref <200)
HDL: 64 mg/dL (ref >=50)
LDL Cholesterol (Calc): 64 mg/dL (calc)
Non-HDL Cholesterol (Calc): 78 mg/dL (calc) (ref <130)
Total CHOL/HDL Ratio: 2.2 (calc) (ref <5.0)
Triglycerides: 55 mg/dL (ref <150)

## 2019-05-31 LAB — CBC WITH DIFFERENTIAL/PLATELET
Absolute Monocytes: 268 cells/uL (ref 200–950)
Basophils Absolute: 48 cells/uL (ref 0–200)
Basophils Relative: 1.1 %
Eosinophils Absolute: 427 cells/uL (ref 15–500)
Eosinophils Relative: 9.7 %
HCT: 35.6 % (ref 35.0–45.0)
Hemoglobin: 11.9 g/dL (ref 11.7–15.5)
Lymphs Abs: 1602 cells/uL (ref 850–3900)
MCH: 32.5 pg (ref 27.0–33.0)
MCHC: 33.4 g/dL (ref 32.0–36.0)
MCV: 97.3 fL (ref 80.0–100.0)
MPV: 12.2 fL (ref 7.5–12.5)
Monocytes Relative: 6.1 %
Neutro Abs: 2055 cells/uL (ref 1500–7800)
Neutrophils Relative %: 46.7 %
Platelets: 132 10*3/uL — ABNORMAL LOW (ref 140–400)
RBC: 3.66 10*6/uL — ABNORMAL LOW (ref 3.80–5.10)
RDW: 12.2 % (ref 11.0–15.0)
Total Lymphocyte: 36.4 %
WBC: 4.4 10*3/uL (ref 3.8–10.8)

## 2019-05-31 LAB — HEMOGLOBIN A1C
Hgb A1c MFr Bld: 5.3 % of total Hgb (ref <5.7)
Mean Plasma Glucose: 105 (calc)
eAG (mmol/L): 5.8 (calc)

## 2019-05-31 LAB — TSH: TSH: 29.68 mIU/L — ABNORMAL HIGH (ref 0.40–4.50)

## 2019-05-31 LAB — VITAMIN D 25 HYDROXY (VIT D DEFICIENCY, FRACTURES): Vit D, 25-Hydroxy: 89 ng/mL (ref 30–100)

## 2019-05-31 LAB — INSULIN, RANDOM: Insulin: 4.4 u[IU]/mL

## 2019-05-31 LAB — MAGNESIUM: Magnesium: 2.3 mg/dL (ref 1.5–2.5)

## 2019-06-29 ENCOUNTER — Other Ambulatory Visit: Payer: Self-pay | Admitting: Adult Health

## 2019-06-29 ENCOUNTER — Other Ambulatory Visit: Payer: Self-pay

## 2019-06-29 ENCOUNTER — Other Ambulatory Visit: Payer: Medicare HMO

## 2019-06-29 DIAGNOSIS — E038 Other specified hypothyroidism: Secondary | ICD-10-CM | POA: Diagnosis not present

## 2019-06-29 DIAGNOSIS — E063 Autoimmune thyroiditis: Secondary | ICD-10-CM | POA: Diagnosis not present

## 2019-06-30 ENCOUNTER — Other Ambulatory Visit: Payer: Self-pay | Admitting: Adult Health

## 2019-06-30 ENCOUNTER — Ambulatory Visit: Payer: Medicare HMO | Admitting: Adult Health Nurse Practitioner

## 2019-06-30 DIAGNOSIS — E038 Other specified hypothyroidism: Secondary | ICD-10-CM

## 2019-06-30 LAB — TSH: TSH: 11.73 mIU/L — ABNORMAL HIGH (ref 0.40–4.50)

## 2019-06-30 MED ORDER — LEVOTHYROXINE SODIUM 25 MCG PO TABS: ORAL_TABLET | ORAL | 4 refills | Status: DC

## 2019-07-04 ENCOUNTER — Telehealth: Payer: Self-pay

## 2019-07-04 NOTE — Telephone Encounter (Signed)
LVM in order to go over med & wellness form before Friday's appt.

## 2019-07-07 ENCOUNTER — Other Ambulatory Visit: Payer: Self-pay

## 2019-07-07 ENCOUNTER — Ambulatory Visit: Payer: Medicare HMO | Admitting: Adult Health Nurse Practitioner

## 2019-07-07 ENCOUNTER — Encounter: Payer: Self-pay | Admitting: Adult Health Nurse Practitioner

## 2019-07-07 VITALS — Ht 63.0 in | Wt 122.0 lb

## 2019-07-07 DIAGNOSIS — M85852 Other specified disorders of bone density and structure, left thigh: Secondary | ICD-10-CM | POA: Diagnosis not present

## 2019-07-07 DIAGNOSIS — E785 Hyperlipidemia, unspecified: Secondary | ICD-10-CM | POA: Diagnosis not present

## 2019-07-07 DIAGNOSIS — Z0001 Encounter for general adult medical examination with abnormal findings: Secondary | ICD-10-CM | POA: Diagnosis not present

## 2019-07-07 DIAGNOSIS — R6889 Other general symptoms and signs: Secondary | ICD-10-CM

## 2019-07-07 DIAGNOSIS — I10 Essential (primary) hypertension: Secondary | ICD-10-CM | POA: Diagnosis not present

## 2019-07-07 DIAGNOSIS — Z0000 Encounter for general adult medical examination without abnormal findings: Secondary | ICD-10-CM

## 2019-07-07 DIAGNOSIS — E559 Vitamin D deficiency, unspecified: Secondary | ICD-10-CM | POA: Diagnosis not present

## 2019-07-07 DIAGNOSIS — R7309 Other abnormal glucose: Secondary | ICD-10-CM

## 2019-07-07 DIAGNOSIS — N183 Chronic kidney disease, stage 3 unspecified: Secondary | ICD-10-CM

## 2019-07-07 DIAGNOSIS — E038 Other specified hypothyroidism: Secondary | ICD-10-CM

## 2019-07-07 DIAGNOSIS — Z6821 Body mass index (BMI) 21.0-21.9, adult: Secondary | ICD-10-CM

## 2019-07-07 DIAGNOSIS — J309 Allergic rhinitis, unspecified: Secondary | ICD-10-CM | POA: Diagnosis not present

## 2019-07-07 DIAGNOSIS — E063 Autoimmune thyroiditis: Secondary | ICD-10-CM

## 2019-07-07 NOTE — Progress Notes (Signed)
Virtual Visit via Telephone Note  I connected with Tracy Brady on 07/07/19 at  8:30 AM EDT by telephone and verified that I am speaking with the correct person using two identifiers.   I discussed the limitations, risks, security and privacy concerns of performing an evaluation and management service by telephone and the availability of in person appointments. I also discussed with the patient that there may be a patient responsible charge related to this service. The patient expressed understanding and agreed to proceed.  WELCOME MEDICARE VISIT AND FOLLOW UP  Assessment:   Diagnoses and all orders for this visit:  Welcome to Medicare preventive visit Initial visit today Yearly Medicare Annual Wellness Exam  Hypothyroidism due to Hashimoto's thyroiditis Taking: Levothyroxine 59mcg Mon-Fri and 4mcg Sat & Sun. Recent increase in dose TSH 11.73 (29.68) Previous Dosing Levothyroxine 15mcg Mon-Thurs, 5mcg Fri-Sunday. Re-check in 6 weeks.  Essential hypertension Controlled with diet and exercise  No medications at this time Hypertension Continue medication: Monitor blood pressure at home; call if consistently over 130/80 Continue DASH diet.   Reminder to go to the ER if any CP, SOB, nausea, dizziness, severe HA, changes vision/speech, left arm numbness and tingling and jaw pain.  Allergic rhinitis, unspecified seasonality, unspecified trigger Doing well at this time Taking Cetirizine 10mg   Hyperlipidemia, unspecified hyperlipidemia type Taking Crestor 10mg  nightly Continue medications Continue low cholesterol diet and exercise.  Check lipid panel  Osteopenia, left femoral neck Discussed taking calcium 1,200mg  daily Continue Vitamin D 5,000IU Next DEXA 2021 Discussed weight bearing exercises  CKD, stage 3, GFR 30-59 ml.min (HCC) Increase fluids  Avoid NSAIDS Blood pressure control Monitor sugars  Will continue to monitor  Vitamin D deficiency Taking Vitamin D  5,000IU  Continue with benefit Will continue to monitor  Abnormal glucose Discussed dietary and exercise modifications Will continue to monitor  BMI 21.0-21.9, adult Down 20lbs since March Encouraged healthy behaviors Continue dietary and exercise modifications Currently doing weight Watchers program   Follow Up Instructions:  Discussed assessment and treatment plan with the patient. The patient was provided an opportunity to ask questions and all were answered. The patient agrees with the plan of care and demonstrates an understanding of the instructions.   The patient was advised to call back or seek an in-person evaluation if the symptoms worsen or if the condition fails to improve as anticipated.   Over 40 minutes of non-face-to-face time during encounter included interview, counseling, chart review and critical decision making was performed Future Appointments  Date Time Provider [place]  08/14/2019  8:45 AM [facility] NURSE [facility] None  09/20/2019  9:00 AM Vicie Mutters, PA-C [facility] None     Plan:   During the course of the visit the patient was educated and counseled about appropriate screening and preventive services including:    Pneumococcal vaccine   Prevnar 13  Influenza vaccine  Td vaccine  Screening electrocardiogram  Bone densitometry screening  Colorectal cancer screening  Diabetes screening  Glaucoma screening  Nutrition counseling   Advanced directives: requested   Subjective:  Tracy Brady is a 63 y.o. female who presents for Welcome to [facility] Preventitive Visit.   Her blood pressure has been controlled at home she did not check her blood pressure today prior to appointment. She does workout. She denies chest pain, shortness of breath, dizziness.  She walks 62min to1 hour daily. She is on cholesterol medication and denies myalgias. Her cholesterol is at goal. The cholesterol last visit was:  Lab Results   Component Value Date   CHOL 142 05/30/2019   HDL 64 05/30/2019   LDLCALC 64 05/30/2019   TRIG 55 05/30/2019   CHOLHDL 2.2 05/30/2019   . She has been working on diet and exercise for abnormal glucose, and denies nausea, paresthesia of the feet, polydipsia, polyuria, visual disturbances and vomiting. Last A1C in the office was:  Lab Results  Component Value Date   HGBA1C 5.3 05/30/2019   Last GFR:  Stage 3 CKD Lab Results  Component Value Date   GFRNONAA 57 (L) 05/30/2019   Lab Results  Component Value Date   GFRAA 66 05/30/2019   Patient is on Vitamin D supplement.   Lab Results  Component Value Date   VD25OH 89 05/30/2019      Medication Review: Current Outpatient Medications on File Prior to Visit  Medication Sig Dispense Refill  . cetirizine (ZYRTEC) 10 MG tablet Take 10 mg by mouth daily.    . Cholecalciferol (VITAMIN D3) 125 MCG (5000 UT) CAPS Take by mouth daily.    Marland Kitchen levothyroxine (SYNTHROID) 25 MCG tablet Take 1 tab daily on empty stomach daily except Monday and Thursday take 1.5 tabs. Take with water only and nothing for at least 45 min after for optimal absorption. 90 tablet 4  . Multiple Vitamins-Minerals (ONE DAILY CALCIUM/IRON) TABS Take by mouth daily.    . rosuvastatin (CRESTOR) 10 MG tablet TAKE 1 TABLET BY MOUTH EVERY DAY 90 tablet 1   No current facility-administered medications on file prior to visit.     No Known Allergies  Current Problems (verified) Patient Active Problem List   Diagnosis Date Noted  . Abnormal glucose 02/23/2019  . Hypothyroidism 11/14/2018  . HTN (hypertension) 11/08/2015  . Hyperlipidemia   . Vitamin D deficiency   . Allergic rhinitis   . Basal cell carcinoma     Screening Tests Immunization History  Administered Date(s) Administered  . Influenza Inj Mdck Quad With Preservative 09/06/2017, 09/12/2018  . Influenza Whole 09/02/2013  . Influenza, Seasonal, Injecte, Preservative Fre 11/08/2015  . Influenza-Unspecified  07/29/2016  . PPD Test 04/24/2014  . Pneumococcal Conjugate-13 05/30/2019  . Pneumococcal Polysaccharide-23 04/13/2013  . Tdap 10/16/2008, 09/06/2017    Preventative care: Last colonoscopy: 2011 Last mammogram: 2019 Last pap smear/pelvic exam: 2016   DEXA: 2019, osteopenia   Prior vaccinations: TD or Tdap: 2018  Influenza: Due for 2020 Pneumococcal: 2014 Prevnar13: 2020 Shingles/Zostavax: Discussed  Names of Other Physician/Practitioners you currently use: 1. [facility] here for primary care 2.Eye Exam: Due, last two years  3. Dental Exam: Due for 2020  Patient Care Team: Unk Pinto, MD as PCP - General (Internal Medicine) Maisie Fus, MD as Consulting Physician (Obstetrics and Gynecology) Leta Baptist, MD as Consulting Physician (Otolaryngology) Laurence Aly, [place] (Optometry) Irene Shipper, MD as Consulting Physician (Gastroenterology) Signe Colt, MD as Referring Physician (Obstetrics and Gynecology) Danella Sensing, MD as Consulting Physician (Dermatology)  SURGICAL HISTORY She  has a past surgical history that includes Tympanoplasty (Left). FAMILY HISTORY Her family history includes Heart attack in her mother; Heart disease in her mother; Hyperlipidemia in her mother; Hypertension in her mother. SOCIAL HISTORY She  reports that she has never smoked. She has never used smokeless tobacco. She reports that she does not drink alcohol or use drugs.   MEDICARE WELLNESS OBJECTIVES: Physical activity: Current Exercise Habits: Home exercise routine, Type of exercise: walking, Time (Minutes): 60, Frequency (Times/Week): 7, Weekly Exercise (Minutes/Week):  420, Intensity: Moderate, Exercise limited by: None identified Cardiac risk factors: Cardiac Risk Factors include: none Depression/mood screen:   Depression screen [facility] 2/9 07/07/2019  Decreased Interest 0  Down, Depressed, Hopeless 0  PHQ - 2 Score 0    ADLs:  In your present  state of health, do you have any difficulty performing the following activities: 07/07/2019  Hearing? N  Vision? N  Difficulty concentrating or making decisions? N  Walking or climbing stairs? N  Dressing or bathing? N  Doing errands, shopping? N  Preparing Food and eating ? N  Using the Toilet? N  In the past six months, have you accidently leaked urine? N  Do you have problems with loss of bowel control? N  Managing your Medications? N  Managing your Finances? N  Housekeeping or managing your Housekeeping? N  Some recent data might be hidden     Cognitive Testing  Alert? Yes  Normal Appearance?Yes  Oriented to person? Yes  Place? Yes   Time? Yes  Recall of three objects?  Yes  Can perform simple calculations? Yes  Displays appropriate judgment?Yes  Can read the correct time from a watch face?Yes  EOL planning: Does Patient Have a Medical Advance Directive?: Yes Type of Advance Directive: [place] Does patient want to make changes to medical advance directive?: Yes (Inpatient - patient defers changing a medical advance directive and declines information at this time) Copy of [place] in Chart?: No - copy requested  Review of Systems  Constitutional: Positive for malaise/fatigue. Negative for chills, diaphoresis, fever and weight loss.       Improved from last TSH labs  HENT: Negative for congestion, ear discharge, ear pain, hearing loss, nosebleeds, sinus pain, sore throat and tinnitus.   Eyes: Negative for blurred vision, double vision, photophobia, pain, discharge and redness.  Respiratory: Negative for cough, hemoptysis, sputum production, shortness of breath, wheezing and stridor.   Cardiovascular: Negative for chest pain, palpitations, orthopnea, claudication, leg swelling and PND.  Gastrointestinal: Negative for abdominal pain, blood in stool, constipation, diarrhea, heartburn, melena, nausea and vomiting.  Genitourinary: Negative  for dysuria, flank pain, frequency, hematuria and urgency.  Musculoskeletal: Negative for back pain, falls, joint pain, myalgias and neck pain.  Skin: Negative for itching and rash.  Neurological: Negative for dizziness, tingling, tremors, sensory change, speech change, focal weakness, seizures, loss of consciousness, weakness and headaches.  Endo/Heme/Allergies: Negative for environmental allergies and polydipsia. Does not bruise/bleed easily.  Psychiatric/Behavioral: Negative for depression, hallucinations, memory loss, substance abuse and suicidal ideas. The patient is not nervous/anxious and does not have insomnia.      Objective:     Today's Vitals   07/07/19 0856  Weight: 122 lb (55.3 kg)  Height: 5\' 3"  (1.6 m)   Body mass index is 21.61 kg/m.  General : Well sounding patient in no apparent distress HEENT: no hoarseness, no cough for duration of visit Lungs: speaks in complete sentences, no audible wheezing, no apparent distress Neurological: alert, oriented x 3 Psychiatric: pleasant, judgement appropriate   Medicare Attestation I have personally reviewed: The patient's medical and social history Their use of alcohol, tobacco or illicit drugs Their current medications and supplements The patient's functional ability including ADLs,fall risks, home safety risks, cognitive, and hearing and visual impairment Diet and physical activities Evidence for depression or mood disorders  The patient's weight, height, BMI, and visual acuity have been recorded in the chart.  I have made referrals, counseling, and provided  education to the patient based on review of the above and I have provided the patient with a written personalized care plan for preventive services.     Garnet Sierras, NP [facility] 07/07/2019  8:50 AM

## 2019-07-07 NOTE — Patient Instructions (Addendum)
Tracy Brady , Thank you for taking time to come for your Medicare Wellness Visit. I appreciate your ongoing commitment to your health goals. Please review the following plan we discussed and let me know if I can assist you in the future.   These are the goals we discussed: Goals    . Continue Exercising, walking 30-64min daily     Great work on your weight loss!!! You are down 32 pounds since February.    This is a list of the screening recommended for you and due dates:  Health Maintenance  Topic Date Due  . Pap Smear  Screenings Complete  . Flu Shot  08/31/2019  . Mammogram  08/09/2019  . Pneumonia vaccines (2 of 2 - PPSV23) 05/29/2020  . DEXA scan (bone density measurement)  08/08/2020  . Colon Cancer Screening  09/25/2020  . Tetanus Vaccine  09/07/2027  .  Hepatitis C: One time screening is recommended by Center for Disease Control  (CDC) for  adults born from [birth year] through 1967.   Completed  . HIV Screening  Completed    Your Levothyroxine dose has changed.  The current dose should be  Levothyroxine 68mcg Mon-Fri and 60mcg Sat & Sun. We will re-check this on 08/14/19, 8:45am, lab visit.    You are due for the Shingrix vaccination.  You may get this an your local pharmacy.  Contact them for available to this.  There may be a wait list.  Be low is some information about this vaccination from [facility], [place].  Shingrix, which was approved by the FDA in October 2017. Doses are given two to six months apart. Shingrix is said to be more than 90% effective against shingles and postherpetic neuralgia - a painful nerve condition that can arise as a shingles complication.  Zostavax has been used since 2006 and has been reported to reduce the risk of shingles by 51%. Research has also shown that Zostavax loses its ability to prevent shingles after five years. If you've ever had chickenpox, you are at risk for shingles, which is essentially a  re-emergence of the virus that caused your chickenpox. The CDC recommends to get the Shingrix vaccination even if you aren't sure you've had chickenpox and if you've already had shingles. Although it's uncommon, you can get shingles more than once. In addition, you should get the Shingrix vaccine even if you already got the Zostavax vaccine.  The wait time between these vaccinations is eight weeks.  Who Should Get Shingrix? Healthy adults 50 years and older should get two doses of Shingrix, separated by 2 to 6 months. You should get Shingrix even if in the past you . had shingles  . received Zostavax  . are not sure if you had chickenpox There is no maximum age for getting Shingrix. If you had shingles in the past, you can get Shingrix to help prevent future occurrences of the disease. There is no specific length of time that you need to wait after having shingles before you can receive Shingrix, but generally you should make sure the shingles rash has gone away before getting vaccinated. You can get Shingrix whether or not you remember having had chickenpox in the past. Studies show that more than 99% of Americans 40 years and older have had chickenpox, even if they don't remember having the disease. Chickenpox and shingles are related because they are caused by the same virus (varicella zoster virus). After a person recovers from  chickenpox, the virus stays dormant (inactive) in the body. It can reactivate years later and cause shingles. If you had Zostavax in the recent past, you should wait at least eight weeks before getting Shingrix. Talk to your healthcare provider to determine the best time to get Shingrix. Shingrix is available in [employer] and pharmacies. To find doctor's offices or pharmacies near you that offer the vaccine, visit [facility] Vaccine FinderExternal. If you have questions about Shingrix, talk with your healthcare provider. Vaccine for Those 54 Years and Older  Shingrix  reduces the risk of shingles and PHN by more than 90% in people 28 and older. CDC recommends the vaccine for healthy adults 57 and older.   Who Should Not Get Shingrix? You should not get Shingrix if you: . have ever had a severe allergic reaction to any component of the vaccine or after a dose of Shingrix  . tested negative for immunity to varicella zoster virus. If you test negative, you should get chickenpox vaccine.  . currently have shingles  . currently are pregnant or breastfeeding. Women who are pregnant or breastfeeding should wait to get Shingrix.  Marland Kitchen receive specific antiviral drugs (acyclovir, famciclovir, or valacyclovir) 24 hours before vaccination (avoid use of these antiviral drugs for 14 days after vaccination)- zoster vaccine live only If you have a minor acute (starts suddenly) illness, such as a cold, you may get Shingrix. But if you have a moderate or severe acute illness, you should usually wait until you recover before getting the vaccine. This includes anyone with a temperature of 101.14F or higher. The side effects of the Shingrix are temporary, and usually last 2 to 3 days. While you may experience pain for a few days after getting Shingrix, the pain will be less severe than having shingles and the complications from the disease.  Risks of Shingrix vaccination:      A sore arm with mild or moderate pain is very common after recombinant shingles vaccine, affecting about 80% of vaccinated people. Redness and swelling can also happen at the site of the injection.     Tiredness, muscle pain, headache, shivering, fever, stomach pain, and nausea happen after vaccination in more than half of people who receive recombinant shingles vaccine.  In clinical trials, about 1 out of 6 people who got recombinant zoster vaccine experienced side effects that prevented them from doing regular activities. Symptoms usually went away on their own in 2 to 3 days.  You should still get the  second dose of recombinant zoster vaccine even if you had one of these reactions after the first dose.  People sometimes faint after medical procedures, including vaccination. Tell your provider if you feel dizzy or have vision changes or ringing in the ears.  As with any medicine, there is a very remote chance of a vaccine causing a severe allergic reaction, other serious injury, or death.

## 2019-08-10 DIAGNOSIS — Z0100 Encounter for examination of eyes and vision without abnormal findings: Secondary | ICD-10-CM | POA: Diagnosis not present

## 2019-08-10 DIAGNOSIS — H5213 Myopia, bilateral: Secondary | ICD-10-CM | POA: Diagnosis not present

## 2019-08-14 ENCOUNTER — Ambulatory Visit (INDEPENDENT_AMBULATORY_CARE_PROVIDER_SITE_OTHER): Payer: Medicare HMO | Admitting: *Deleted

## 2019-08-14 ENCOUNTER — Other Ambulatory Visit: Payer: Self-pay

## 2019-08-14 VITALS — BP 102/78 | HR 80 | Temp 97.2°F | Resp 16 | Ht 63.0 in | Wt 121.8 lb

## 2019-08-14 DIAGNOSIS — E063 Autoimmune thyroiditis: Secondary | ICD-10-CM

## 2019-08-14 DIAGNOSIS — Z1231 Encounter for screening mammogram for malignant neoplasm of breast: Secondary | ICD-10-CM | POA: Diagnosis not present

## 2019-08-14 DIAGNOSIS — E038 Other specified hypothyroidism: Secondary | ICD-10-CM | POA: Diagnosis not present

## 2019-08-14 LAB — TSH: TSH: 5.99 mIU/L — ABNORMAL HIGH (ref 0.40–4.50)

## 2019-08-14 LAB — HM MAMMOGRAPHY

## 2019-08-14 NOTE — Progress Notes (Signed)
Patient is here for a NV and lab to recheck a TSH.  The patient states she is  taking Levothyroxine 25 mcg 2 tablets Monday through Friday and 1 tablet on Saturday and Sunday.

## 2019-08-14 NOTE — Addendum Note (Signed)
Addended by: Eulis Canner on: 08/14/2019 08:50 AM   Modules accepted: Orders

## 2019-08-31 DIAGNOSIS — R69 Illness, unspecified: Secondary | ICD-10-CM | POA: Diagnosis not present

## 2019-09-06 DIAGNOSIS — R69 Illness, unspecified: Secondary | ICD-10-CM | POA: Diagnosis not present

## 2019-09-15 NOTE — Progress Notes (Signed)
Complete Physical  Assessment and Plan:  Encounter for general adult medical examination with abnormal findings 1 year  Hyperlipidemia, unspecified hyperlipidemia type -     Lipid panel check lipids decrease fatty foods increase activity.   Essential hypertension -     CBC with Differential/Platelet -     COMPLETE METABOLIC PANEL WITH GFR -     Urinalysis, Routine w reflex microscopic -     Microalbumin / creatinine urine ratio -     EKG 12-Lead - continue medications, DASH diet, exercise and monitor at home. Call if greater than 130/80.   Hypothyroidism due to Hashimoto's thyroiditis -     TSH Hypothyroidism-check TSH level, continue medications the same, reminded to take on an empty stomach 30-79mins before food.   Abnormal glucose With better eating and purposeful weight loss will defer A1C  Vitamin D deficiency -     VITAMIN D 25 Hydroxy (Vit-D Deficiency, Fractures)  Basal cell carcinoma (BCC), unspecified site Monitor  Allergic rhinitis, unspecified seasonality, unspecified trigger - Allegra OTC, increase H20, allergy hygiene explained.  Stage 3 chronic kidney disease, unspecified whether stage 3a or 3b CKD -     COMPLETE METABOLIC PANEL WITH GFR -     Urinalysis, Routine w reflex microscopic -     Microalbumin / creatinine urine ratio -     Iron,Total/Total Iron Binding Cap Increase fluids, avoid NSAIDS, monitor sugars, will monitor  Medication management -     Magnesium  Fatigue, unspecified type -     EKG 12-Lead - rule out anemia and thyroid - no other symptoms but possible can be related to sleep - no symptoms of sleep apnea will work on sleep hygiene, cut out caffeine.   B12 deficiency -     Vitamin B12  Discussed med's effects and SE's. Screening labs and tests as requested with regular follow-up as recommended. Future Appointments  Date Time Provider [place]  07/09/2020  9:00 AM Garnet Sierras, NP [facility] None    HPI  63 y.o.  female  presents for a complete physical.  She states she has been more tired. She states she does not sleep well at night, if she gets at least 4 hours she is good. She takes melatonin for sleep. She has an issue getting to sleeps, goes to lay down around 12 and will take 1 hour to get to sleep. She wakes up fatigued in the AM. No snoring or sounds like OSA.  She is watching something on her Ipad.   We changed her thyroid last visit, she is on 49mcg M-Saturday and 25 mcg on Sunday.  Lab Results  Component Value Date   TSH 5.99 (H) 08/14/2019   BMI is Body mass index is 21.13 kg/m., she is working on diet and exercise. She has been on a no sugar, just veggies and fruit x Jan and has lot 40+ lbs on purpose.  Wt Readings from Last 10 Encounters:  09/20/19 121 lb 3.2 oz (55 kg)  08/14/19 121 lb 12.8 oz (55.2 kg)  07/07/19 122 lb (55.3 kg)  05/30/19 126 lb 3.2 oz (57.2 kg)  02/23/19 146 lb (66.2 kg)  01/06/19 154 lb (69.9 kg)  11/15/18 164 lb 6.4 oz (74.6 kg)  09/12/18 162 lb (73.5 kg)  08/04/18 165 lb (74.8 kg)  04/04/18 153 lb (69.4 kg)   Lab Results  Component Value Date   VITAMINB12 507 09/12/2018   Lab Results  Component Value Date   IRON 125 09/12/2018  TIBC 299 09/12/2018   Her blood pressure has been controlled at home, today their BP is BP: 112/80.  She does workout, 1 hour daily walking. She denies chest pain, shortness of breath, dizziness.   She reports that [facility] is [facility] [employer] and using the treadmill there.    She is on cholesterol medication, crestor and denies myalgias. Her cholesterol is at goal. The cholesterol last visit was:  Lab Results  Component Value Date   CHOL 142 05/30/2019   HDL 64 05/30/2019   LDLCALC 64 05/30/2019   TRIG 55 05/30/2019   CHOLHDL 2.2 05/30/2019  . She has been working on diet and exercise for prediabetes, . Last A1C in the office was:  Lab Results  Component Value Date   HGBA1C 5.3 05/30/2019   Patient is on Vitamin D  supplement, 1500 IU a day.   Lab Results  Component Value Date   VD25OH 17 05/30/2019      She still sees Dr. Marvel Plan her Obgyn once yearly.   Current Medications:  Current Outpatient Medications on File Prior to Visit  Medication Sig Dispense Refill  . cetirizine (ZYRTEC) 10 MG tablet Take 10 mg by mouth daily.    . Cholecalciferol (VITAMIN D3) 125 MCG (5000 UT) CAPS Take by mouth daily.    Marland Kitchen levothyroxine (SYNTHROID) 25 MCG tablet Take 1 tab daily on empty stomach daily except Monday and Thursday take 1.5 tabs. Take with water only and nothing for at least 45 min after for optimal absorption. 90 tablet 4  . Multiple Vitamins-Minerals (ONE DAILY CALCIUM/IRON) TABS Take by mouth daily.    . rosuvastatin (CRESTOR) 10 MG tablet TAKE 1 TABLET BY MOUTH EVERY DAY 90 tablet 1   No current facility-administered medications on file prior to visit.     Health Maintenance:   Immunization History  Administered Date(s) Administered  . Influenza Inj Mdck Quad With Preservative 09/06/2017, 09/12/2018  . Influenza Whole 09/02/2013  . Influenza, High Dose Seasonal PF 08/31/2019  . Influenza, Seasonal, Injecte, Preservative Fre 11/08/2015  . Influenza-Unspecified 07/29/2016  . PPD Test 04/24/2014  . Pneumococcal Conjugate-13 05/30/2019  . Pneumococcal Polysaccharide-23 04/13/2013  . Tdap 10/16/2008, 09/06/2017  . Zoster Recombinat (Shingrix) 08/31/2019   Tetanus: 2018 Pneumovax: 2014 Prevnar 13 2020 Flu vaccine: 2020 Shingrix 08/2019  Pap: 07/2017- has appointment in Jan MGM: 08/2019 DEXA: 07/2018 Colonoscopy: 2011 repeat 10 years- had benign 1 polyp Last Dental Exam: Once yearly Last Eye Exam: Dr. Ronnald Ramp  Patient Care Team: Unk Pinto, MD as PCP - General (Internal Medicine) Maisie Fus, MD as Consulting Physician (Obstetrics and Gynecology) Leta Baptist, MD as Consulting Physician (Otolaryngology) Laurence Aly, OD (Optometry) Irene Shipper, MD as Consulting Physician  (Gastroenterology) Signe Colt, MD as Referring Physician (Obstetrics and Gynecology) Danella Sensing, MD as Consulting Physician (Dermatology)  Medical History:  Past Medical History:  Diagnosis Date  . Allergic rhinitis   . Basal cell carcinoma   . Hyperlipidemia   . Thyroid disease   . Vitamin D deficiency    Allergies No Known Allergies  SURGICAL HISTORY She  has a past surgical history that includes Tympanoplasty (Left). FAMILY HISTORY Her family history includes Heart attack in her mother; Heart disease in her mother; Hyperlipidemia in her mother; Hypertension in her mother. SOCIAL HISTORY She  reports that she has never smoked. She has never used smokeless tobacco. She reports that she does not drink alcohol or use drugs.   Review of Systems: Review of  Systems  Constitutional: Negative for chills, fever and malaise/fatigue.  HENT: Negative for congestion, ear pain and sore throat.   Eyes: Negative.   Respiratory: Negative for cough, shortness of breath and wheezing.   Cardiovascular: Negative for chest pain, palpitations and leg swelling.  Gastrointestinal: Negative for abdominal pain, blood in stool, constipation, diarrhea, heartburn and melena.  Genitourinary: Negative.   Musculoskeletal: Negative for back pain, falls, joint pain, myalgias and neck pain.  Skin: Negative.   Neurological: Negative for dizziness, sensory change, loss of consciousness and headaches.  Psychiatric/Behavioral: Negative for depression. The patient is not nervous/anxious and does not have insomnia.     Physical Exam: Estimated body mass index is 21.13 kg/m as calculated from the following:   Height as of this encounter: 5' 3.5" (1.613 m).   Weight as of this encounter: 121 lb 3.2 oz (55 kg). BP 112/80   Pulse 77   Temp 97.6 F (36.4 C)   Ht 5' 3.5" (1.613 m)   Wt 121 lb 3.2 oz (55 kg)   SpO2 98%   BMI 21.13 kg/m   General Appearance: Well nourished well developed, in no  apparent distress.  Eyes: PERRLA, EOMs, conjunctiva no swelling or erythema ENT/Mouth: Ear canals normal without obstruction, swelling, erythema, or discharge.  TMs normal bilaterally with no erythema, bulging, retraction, or loss of landmark.  Oropharynx moist and clear with no exudate, erythema, or swelling.   Neck: Supple, thyroid normal. No bruits.  No cervical adenopathy Respiratory: Respiratory effort normal, Breath sounds clear A&P without wheeze, rhonchi, rales.   Cardio: RRR without murmurs, rubs or gallops. Brisk peripheral pulses without edema.  Chest: symmetric, with normal excursions Breasts: Symmetric, without lumps, nipple discharge, retractions.  Abdomen: Soft, nontender, no guarding, rebound, hernias, masses, or organomegaly.  Lymphatics: Non tender without lymphadenopathy.  Musculoskeletal: Full ROM all peripheral extremities,5/5 strength, and normal gait. Skin: Warm, dry without rashes, lesions, ecchymosis. Neuro: Awake and oriented X 3, Cranial nerves intact, reflexes equal bilaterally. Normal muscle tone, no cerebellar symptoms. Sensation intact.  Psych:  normal affect, Insight and Judgment appropriate.   EKG: WNL no changes.  Over 40 minutes of exam, counseling, chart review and critical decision making was performed  Vicie Mutters 9:06 AM [facility]

## 2019-09-20 ENCOUNTER — Encounter: Payer: Self-pay | Admitting: Internal Medicine

## 2019-09-20 ENCOUNTER — Encounter: Payer: Self-pay | Admitting: Physician Assistant

## 2019-09-20 ENCOUNTER — Ambulatory Visit (INDEPENDENT_AMBULATORY_CARE_PROVIDER_SITE_OTHER): Payer: Medicare HMO | Admitting: Physician Assistant

## 2019-09-20 ENCOUNTER — Other Ambulatory Visit: Payer: Self-pay

## 2019-09-20 VITALS — BP 112/80 | HR 77 | Temp 97.6°F | Ht 63.5 in | Wt 121.2 lb

## 2019-09-20 DIAGNOSIS — Z79899 Other long term (current) drug therapy: Secondary | ICD-10-CM | POA: Diagnosis not present

## 2019-09-20 DIAGNOSIS — I10 Essential (primary) hypertension: Secondary | ICD-10-CM

## 2019-09-20 DIAGNOSIS — N183 Chronic kidney disease, stage 3 unspecified: Secondary | ICD-10-CM

## 2019-09-20 DIAGNOSIS — E038 Other specified hypothyroidism: Secondary | ICD-10-CM

## 2019-09-20 DIAGNOSIS — E063 Autoimmune thyroiditis: Secondary | ICD-10-CM | POA: Diagnosis not present

## 2019-09-20 DIAGNOSIS — Z136 Encounter for screening for cardiovascular disorders: Secondary | ICD-10-CM | POA: Diagnosis not present

## 2019-09-20 DIAGNOSIS — Z130 Encounter for screening for diseases of the blood and blood-forming organs and certain disorders involving the immune mechanism: Secondary | ICD-10-CM | POA: Diagnosis not present

## 2019-09-20 DIAGNOSIS — R5383 Other fatigue: Secondary | ICD-10-CM

## 2019-09-20 DIAGNOSIS — C4491 Basal cell carcinoma of skin, unspecified: Secondary | ICD-10-CM

## 2019-09-20 DIAGNOSIS — E538 Deficiency of other specified B group vitamins: Secondary | ICD-10-CM

## 2019-09-20 DIAGNOSIS — Z0001 Encounter for general adult medical examination with abnormal findings: Secondary | ICD-10-CM

## 2019-09-20 DIAGNOSIS — Z0000 Encounter for general adult medical examination without abnormal findings: Secondary | ICD-10-CM

## 2019-09-20 DIAGNOSIS — E559 Vitamin D deficiency, unspecified: Secondary | ICD-10-CM

## 2019-09-20 DIAGNOSIS — J309 Allergic rhinitis, unspecified: Secondary | ICD-10-CM

## 2019-09-20 DIAGNOSIS — R7309 Other abnormal glucose: Secondary | ICD-10-CM

## 2019-09-20 DIAGNOSIS — E785 Hyperlipidemia, unspecified: Secondary | ICD-10-CM

## 2019-09-20 NOTE — Patient Instructions (Addendum)
Try the melatonin 79m-30mg dissolvable or gummy 30-60 mins before bed   11 Tips to Follow:  1. No caffeine after 2pm: Avoid beverages with caffeine (soda, tea, energy drinks, etc.) especially after 2pm. 2. Don't go to bed hungry: Have your evening meal at least 3 hrs. before going to sleep. It's fine to have a small bedtime snack such as a glass of milk and a few crackers but don't have a big meal. 3. Have a nightly routine before bed: Plan on "winding down" before you go to sleep. Begin relaxing about 1 hour before you go to bed. Try doing a quiet activity such as listening to calming music, reading a book or meditating. 4. Turn off the TV and ALL electronics including video games, tablets, laptops, etc. 1 hour before sleep, and keep them out of the bedroom. 5. Turn off your cell phone and all notifications (new email and text alerts) or even better, leave your phone outside your room while you sleep. Studies have shown that a part of your brain continues to respond to certain lights and sounds even while you're still asleep. 6. Make your bedroom quiet, dark and cool. If you can't control the noise, try wearing earplugs or using a fan to block out other sounds. 7. Practice relaxation techniques. Try reading a book or meditating or drain your brain by writing a list of what you need to do the next day. 8. Don't nap unless you feel sick: you'll have a better night's sleep. 9. Don't smoke, or quit if you do. Nicotine, alcohol, and marijuana can all keep you awake. Talk to your health care provider if you need help with substance use. 10. Most importantly, wake up at the same time every day (or within 1 hour of your usual wake up time) EVEN on the weekends. A regular wake up time promotes sleep hygiene and prevents sleep problems. 11. Reduce exposure to bright light in the last three hours of the day before going to sleep. Maintaining good sleep hygiene and having good sleep habits lower your risk of  developing sleep problems. Getting better sleep can also improve your concentration and alertness. Try the simple steps in this guide. If you still have trouble getting enough rest, make an appointment with your health care provider.   Fatigue If you have fatigue, you feel tired all the time and have a lack of energy or a lack of motivation. Fatigue may make it difficult to start or complete tasks because of exhaustion. In general, occasional or mild fatigue is often a normal response to activity or life. However, long-lasting (chronic) or extreme fatigue may be a symptom of a medical condition. Follow these instructions at home: General instructions  Watch your fatigue for any changes.  Go to bed and get up at the same time every day.  Avoid fatigue by pacing yourself during the day and getting enough sleep at night.  Maintain a healthy weight. Medicines  Take over-the-counter and prescription medicines only as told by your health care provider.  Take a multivitamin, if told by your health care provider.  Do not use herbal or dietary supplements unless they are approved by your health care provider. Activity   Exercise regularly, as told by your health care provider.  Use or practice techniques to help you relax, such as yoga, tai chi, meditation, or massage therapy. Eating and drinking   Avoid heavy meals in the evening.  Eat a well-balanced diet, which includes lean proteins, whole  grains, plenty of fruits and vegetables, and low-fat dairy products.  Avoid consuming too much caffeine.  Avoid the use of alcohol.  Drink enough fluid to keep your urine pale yellow. Lifestyle  Change situations that cause you stress. Try to keep your work and personal schedule in balance.  Do not use any products that contain nicotine or tobacco, such as cigarettes and e-cigarettes. If you need help quitting, ask your health care provider.  Do not use drugs. Contact a health care  provider if:  Your fatigue does not get better.  You have a fever.  You suddenly lose or gain weight.  You have headaches.  You have trouble falling asleep or sleeping through the night.  You feel angry, guilty, anxious, or sad.  You are unable to have a bowel movement (constipation).  Your skin is dry.  You have swelling in your legs or another part of your body. Get help right away if:  You feel confused.  Your vision is blurry.  You feel faint or you pass out.  You have a severe headache.  You have severe pain in your abdomen, your back, or the area between your waist and hips (pelvis).  You have chest pain, shortness of breath, or an irregular or fast heartbeat.  You are unable to urinate, or you urinate less than normal.  You have abnormal bleeding, such as bleeding from the rectum, vagina, nose, lungs, or nipples.  You vomit blood.  You have thoughts about hurting yourself or others. If you ever feel like you may hurt yourself or others, or have thoughts about taking your own life, get help right away. You can go to your nearest emergency department or call:  Your local emergency services (911 in the [place]  A suicide crisis helpline, such as the [facility] at 985-653-4855. This is open 24 hours a day. Summary  If you have fatigue, you feel tired all the time and have a lack of energy or a lack of motivation.  Fatigue may make it difficult to start or complete tasks because of exhaustion.  Long-lasting (chronic) or extreme fatigue may be a symptom of a medical condition.  Exercise regularly, as told by your health care provider.  Change situations that cause you stress. Try to keep your work and personal schedule in balance. This information is not intended to replace advice given to you by your health care provider. Make sure you discuss any questions you have with your health care provider. Document Released: 09/13/2007  Document Revised: 03/09/2019 Document Reviewed: 08/11/2017 Elsevier Patient Education  2020 [employer].

## 2019-09-21 LAB — LIPID PANEL
Cholesterol: 150 mg/dL (ref <200)
HDL: 74 mg/dL (ref >=50)
LDL Cholesterol (Calc): 66 mg/dL (calc)
Non-HDL Cholesterol (Calc): 76 mg/dL (calc) (ref <130)
Total CHOL/HDL Ratio: 2 (calc) (ref <5.0)
Triglycerides: 36 mg/dL (ref <150)

## 2019-09-21 LAB — URINALYSIS, ROUTINE W REFLEX MICROSCOPIC
Bilirubin Urine: NEGATIVE
Glucose, UA: NEGATIVE
Hgb urine dipstick: NEGATIVE
Ketones, ur: NEGATIVE
Leukocytes,Ua: NEGATIVE
Nitrite: NEGATIVE
Protein, ur: NEGATIVE
Specific Gravity, Urine: 1.017 (ref 1.001–1.03)
pH: 5.5 (ref 5.0–8.0)

## 2019-09-21 LAB — COMPLETE METABOLIC PANEL WITH GFR
AG Ratio: 1.5 (calc) (ref 1.0–2.5)
ALT: 14 U/L (ref 6–29)
AST: 17 U/L (ref 10–35)
Albumin: 3.8 g/dL (ref 3.6–5.1)
Alkaline phosphatase (APISO): 57 U/L (ref 37–153)
BUN: 22 mg/dL (ref 7–25)
CO2: 26 mmol/L (ref 20–32)
Calcium: 9.2 mg/dL (ref 8.6–10.4)
Chloride: 105 mmol/L (ref 98–110)
Creat: 0.97 mg/dL (ref 0.50–0.99)
GFR, Est African American: 71 mL/min/{1.73_m2} (ref >=60)
GFR, Est Non African American: 61 mL/min/{1.73_m2} (ref >=60)
Globulin: 2.6 g/dL (calc) (ref 1.9–3.7)
Glucose, Bld: 76 mg/dL (ref 65–99)
Potassium: 4.1 mmol/L (ref 3.5–5.3)
Sodium: 139 mmol/L (ref 135–146)
Total Bilirubin: 0.4 mg/dL (ref 0.2–1.2)
Total Protein: 6.4 g/dL (ref 6.1–8.1)

## 2019-09-21 LAB — CBC WITH DIFFERENTIAL/PLATELET
Absolute Monocytes: 303 cells/uL (ref 200–950)
Basophils Absolute: 41 cells/uL (ref 0–200)
Basophils Relative: 1 %
Eosinophils Absolute: 139 cells/uL (ref 15–500)
Eosinophils Relative: 3.4 %
HCT: 33.9 % — ABNORMAL LOW (ref 35.0–45.0)
Hemoglobin: 11.4 g/dL — ABNORMAL LOW (ref 11.7–15.5)
Lymphs Abs: 1488 cells/uL (ref 850–3900)
MCH: 32.9 pg (ref 27.0–33.0)
MCHC: 33.6 g/dL (ref 32.0–36.0)
MCV: 97.7 fL (ref 80.0–100.0)
MPV: 11.5 fL (ref 7.5–12.5)
Monocytes Relative: 7.4 %
Neutro Abs: 2128 cells/uL (ref 1500–7800)
Neutrophils Relative %: 51.9 %
Platelets: 150 10*3/uL (ref 140–400)
RBC: 3.47 10*6/uL — ABNORMAL LOW (ref 3.80–5.10)
RDW: 11.4 % (ref 11.0–15.0)
Total Lymphocyte: 36.3 %
WBC: 4.1 10*3/uL (ref 3.8–10.8)

## 2019-09-21 LAB — MICROALBUMIN / CREATININE URINE RATIO
Creatinine, Urine: 86 mg/dL (ref 20–275)
Microalb Creat Ratio: 3 mcg/mg creat (ref <30)
Microalb, Ur: 0.3 mg/dL

## 2019-09-21 LAB — TSH: TSH: 3.93 mIU/L (ref 0.40–4.50)

## 2019-09-21 LAB — VITAMIN B12: Vitamin B-12: 415 pg/mL (ref 200–1100)

## 2019-09-21 LAB — VITAMIN D 25 HYDROXY (VIT D DEFICIENCY, FRACTURES): Vit D, 25-Hydroxy: 78 ng/mL (ref 30–100)

## 2019-09-21 LAB — IRON, TOTAL/TOTAL IRON BINDING CAP
%SAT: 20 % (calc) (ref 16–45)
Iron: 58 ug/dL (ref 45–160)
TIBC: 291 mcg/dL (calc) (ref 250–450)

## 2019-09-21 LAB — MAGNESIUM: Magnesium: 2.2 mg/dL (ref 1.5–2.5)

## 2019-09-25 ENCOUNTER — Other Ambulatory Visit: Payer: Self-pay | Admitting: Adult Health

## 2019-10-07 DIAGNOSIS — N3000 Acute cystitis without hematuria: Secondary | ICD-10-CM

## 2019-10-07 MED ORDER — SULFAMETHOXAZOLE-TRIMETHOPRIM 800-160 MG PO TABS: 1.0000 | ORAL_TABLET | Freq: Two times a day (BID) | ORAL | 0 refills | Status: DC

## 2019-12-21 DIAGNOSIS — Z01419 Encounter for gynecological examination (general) (routine) without abnormal findings: Secondary | ICD-10-CM | POA: Diagnosis not present

## 2019-12-21 DIAGNOSIS — Z6821 Body mass index (BMI) 21.0-21.9, adult: Secondary | ICD-10-CM | POA: Diagnosis not present

## 2019-12-26 ENCOUNTER — Ambulatory Visit: Payer: Medicare HMO | Admitting: Physician Assistant

## 2019-12-26 NOTE — Progress Notes (Signed)
Assessment and Plan:  Essential hypertension - continue medications, DASH diet, exercise and monitor at home. Call if greater than 130/80.  -     CBC with Differential/Platelet -     COMPLETE METABOLIC PANEL WITH GFR -     TSH  Hypothyroidism due to Hashimoto's thyroiditis -     TSH Hypothyroidism-check TSH level, continue medications the same, reminded to take on an empty stomach 30-71mins before food.   Hyperlipidemia, unspecified hyperlipidemia type -     Lipid panel check lipids decrease fatty foods increase activity.   Abnormal glucose Controlled with weight loss  Vitamin D deficiency -     VITAMIN D 25 Hydroxy (Vit-D Deficiency, Fractures)  Recurrent UTI -     Urinalysis, Routine w reflex microscopic -     Urine Culture -     cephALEXin (KEFLEX) 500 MG capsule; Take 1 capsule (500 mg total) by mouth 4 (four) times daily.  Medication management -     Magnesium  Discussed med's effects and SE's. Screening labs and tests as requested with regular follow-up as recommended. Future Appointments  Date Time Provider [place]  04/11/2020 10:00 AM Vicie Mutters, PA-C [facility] None  10/01/2020  9:00 AM Vicie Mutters, PA-C [facility] None    HPI  64 y.o. female  presents for a follow up  She states she feels she is having another UTI, since yesterday she has been having dysuria, frequency, Last visit was 08/2018.  No hematuria, flank pain, fever, chills.   She is right handed, has had itching of her right arm intermittent x 1 year, will last for about an hour and then go away. She states nothing makes it worse, nothing makes it better but she has not tried. No neck, no shoulder pain. No weakness, decreased grip, numbness tingling in her right arm. No rash.   We changed her thyroid last visit, she is on 12mcg  Daily but 1.5 2 days a week.  Lab Results  Component Value Date   TSH 3.93 09/20/2019   BMI is Body mass index is 21.97 kg/m., she is working on  diet and exercise. She has been on a no sugar, just veggies and fruit x Jan and has lot 40+ lbs on purpose.  Wt Readings from Last 10 Encounters:  12/28/19 126 lb (57.2 kg)  09/20/19 121 lb 3.2 oz (55 kg)  08/14/19 121 lb 12.8 oz (55.2 kg)  07/07/19 122 lb (55.3 kg)  05/30/19 126 lb 3.2 oz (57.2 kg)  02/23/19 146 lb (66.2 kg)  01/06/19 154 lb (69.9 kg)  11/15/18 164 lb 6.4 oz (74.6 kg)  09/12/18 162 lb (73.5 kg)  08/04/18 165 lb (74.8 kg)   Lab Results  Component Value Date   VITAMINB12 415 09/20/2019   Lab Results  Component Value Date   IRON 58 09/20/2019   TIBC 291 09/20/2019   Her blood pressure has been controlled at home, today their BP is BP: 110/70.  She does workout, 1 hour daily walking. She denies chest pain, shortness of breath, dizziness.   She reports that [facility] is [facility] [employer] and using the treadmill there.    She is on cholesterol medication, crestor and denies myalgias. Her cholesterol is at goal. The cholesterol last visit was:  Lab Results  Component Value Date   CHOL 150 09/20/2019   HDL 74 09/20/2019   LDLCALC 66 09/20/2019   TRIG 36 09/20/2019   CHOLHDL 2.0 09/20/2019  . She has been  working on diet and exercise for prediabetes, . Last A1C in the office was:  Lab Results  Component Value Date   HGBA1C 5.3 05/30/2019   Patient is on Vitamin D supplement, 1500 IU a day.   Lab Results  Component Value Date   VD25OH 78 09/20/2019       Current Medications:  Current Outpatient Medications on File Prior to Visit  Medication Sig Dispense Refill  . cetirizine (ZYRTEC) 10 MG tablet Take 10 mg by mouth daily.    . Cholecalciferol (VITAMIN D3) 125 MCG (5000 UT) CAPS Take by mouth daily.    Marland Kitchen levothyroxine (SYNTHROID) 25 MCG tablet Take 1 tab daily on empty stomach daily except Monday and Thursday take 1.5 tabs. Take with water only and nothing for at least 45 min after for optimal absorption. 90 tablet 4  . Multiple Vitamins-Minerals (ONE DAILY  CALCIUM/IRON) TABS Take by mouth daily.    . rosuvastatin (CRESTOR) 10 MG tablet TAKE 1 TABLET BY MOUTH EVERY DAY 90 tablet 1   No current facility-administered medications on file prior to visit.    Medical History:  Past Medical History:  Diagnosis Date  . Allergic rhinitis   . Basal cell carcinoma   . Hyperlipidemia   . Thyroid disease   . Vitamin D deficiency    Allergies No Known Allergies  Surgical History: reviewed and unchanged Family History: reviewed and unchanged Social History: reviewed and unchanged   Review of Systems: Review of Systems  Constitutional: Negative for chills, fever and malaise/fatigue.  HENT: Negative for congestion, ear pain and sore throat.   Eyes: Negative.   Respiratory: Negative for cough, shortness of breath and wheezing.   Cardiovascular: Negative for chest pain, palpitations and leg swelling.  Gastrointestinal: Negative for abdominal pain, blood in stool, constipation, diarrhea, heartburn and melena.  Genitourinary: Negative.   Musculoskeletal: Negative for back pain, falls, joint pain, myalgias and neck pain.  Skin: Negative.   Neurological: Negative for dizziness, sensory change, loss of consciousness and headaches.  Psychiatric/Behavioral: Negative for depression. The patient is not nervous/anxious and does not have insomnia.     Physical Exam: Estimated body mass index is 21.97 kg/m as calculated from the following:   Height as of 09/20/19: 5' 3.5" (1.613 m).   Weight as of this encounter: 126 lb (57.2 kg). BP 110/70   Pulse 60   Temp 97.6 F (36.4 C)   Wt 126 lb (57.2 kg)   SpO2 96%   BMI 21.97 kg/m   General Appearance: Well nourished well developed, in no apparent distress.  Eyes: PERRLA, EOMs, conjunctiva no swelling or erythema ENT/Mouth: Ear canals normal without obstruction, swelling, erythema, or discharge.  TMs normal bilaterally with no erythema, bulging, retraction, or loss of landmark.  Oropharynx moist and  clear with no exudate, erythema, or swelling.   Neck: Supple, thyroid normal. No bruits.  No cervical adenopathy Respiratory: Respiratory effort normal, Breath sounds clear A&P without wheeze, rhonchi, rales.   Cardio: RRR without murmurs, rubs or gallops. Brisk peripheral pulses without edema.  Chest: symmetric, with normal excursions Abdomen: Soft, nontender, no guarding, rebound, hernias, masses, or organomegaly.  Lymphatics: Non tender without lymphadenopathy.  Musculoskeletal: Full ROM all peripheral extremities,5/5 strength, and normal gait. Skin: Warm, dry without rashes, lesions, ecchymosis. Neuro: Awake and oriented X 3, Cranial nerves intact, reflexes equal bilaterally. Normal muscle tone, no cerebellar symptoms. Sensation intact.  Psych:  normal affect, Insight and Judgment appropriate.    Vicie Mutters 11:20 AM [facility]  [facility]

## 2019-12-28 ENCOUNTER — Other Ambulatory Visit: Payer: Self-pay

## 2019-12-28 ENCOUNTER — Ambulatory Visit (INDEPENDENT_AMBULATORY_CARE_PROVIDER_SITE_OTHER): Payer: Medicare HMO | Admitting: Physician Assistant

## 2019-12-28 ENCOUNTER — Encounter: Payer: Self-pay | Admitting: Physician Assistant

## 2019-12-28 VITALS — BP 110/70 | HR 60 | Temp 97.6°F | Wt 126.0 lb

## 2019-12-28 DIAGNOSIS — E038 Other specified hypothyroidism: Secondary | ICD-10-CM | POA: Diagnosis not present

## 2019-12-28 DIAGNOSIS — N390 Urinary tract infection, site not specified: Secondary | ICD-10-CM | POA: Diagnosis not present

## 2019-12-28 DIAGNOSIS — E559 Vitamin D deficiency, unspecified: Secondary | ICD-10-CM | POA: Diagnosis not present

## 2019-12-28 DIAGNOSIS — R7309 Other abnormal glucose: Secondary | ICD-10-CM | POA: Diagnosis not present

## 2019-12-28 DIAGNOSIS — I10 Essential (primary) hypertension: Secondary | ICD-10-CM

## 2019-12-28 DIAGNOSIS — Z79899 Other long term (current) drug therapy: Secondary | ICD-10-CM

## 2019-12-28 DIAGNOSIS — E785 Hyperlipidemia, unspecified: Secondary | ICD-10-CM

## 2019-12-28 DIAGNOSIS — E063 Autoimmune thyroiditis: Secondary | ICD-10-CM

## 2019-12-28 MED ORDER — CEPHALEXIN 500 MG PO CAPS: 500.0000 mg | ORAL_CAPSULE | Freq: Four times a day (QID) | ORAL | 12 refills | Status: DC

## 2019-12-28 NOTE — Patient Instructions (Signed)

## 2019-12-29 ENCOUNTER — Ambulatory Visit: Payer: 59

## 2019-12-29 MED ORDER — CEPHALEXIN 500 MG PO CAPS: 500.0000 mg | ORAL_CAPSULE | Freq: Four times a day (QID) | ORAL | 0 refills | Status: DC

## 2019-12-30 LAB — LIPID PANEL
Cholesterol: 153 mg/dL (ref <200)
HDL: 79 mg/dL (ref >=50)
LDL Cholesterol (Calc): 60 mg/dL (calc)
Non-HDL Cholesterol (Calc): 74 mg/dL (calc) (ref <130)
Total CHOL/HDL Ratio: 1.9 (calc) (ref <5.0)
Triglycerides: 61 mg/dL (ref <150)

## 2019-12-30 LAB — URINALYSIS, ROUTINE W REFLEX MICROSCOPIC
Bilirubin Urine: NEGATIVE
Glucose, UA: NEGATIVE
Ketones, ur: NEGATIVE
Nitrite: NEGATIVE
Protein, ur: NEGATIVE
Specific Gravity, Urine: 1.011 (ref 1.001–1.03)
pH: 7 (ref 5.0–8.0)

## 2019-12-30 LAB — CBC WITH DIFFERENTIAL/PLATELET
Absolute Monocytes: 420 cells/uL (ref 200–950)
Basophils Absolute: 62 cells/uL (ref 0–200)
Basophils Relative: 1.1 %
Eosinophils Absolute: 241 cells/uL (ref 15–500)
Eosinophils Relative: 4.3 %
HCT: 35.5 % (ref 35.0–45.0)
Hemoglobin: 11.8 g/dL (ref 11.7–15.5)
Lymphs Abs: 1294 cells/uL (ref 850–3900)
MCH: 32.4 pg (ref 27.0–33.0)
MCHC: 33.2 g/dL (ref 32.0–36.0)
MCV: 97.5 fL (ref 80.0–100.0)
MPV: 11.8 fL (ref 7.5–12.5)
Monocytes Relative: 7.5 %
Neutro Abs: 3584 cells/uL (ref 1500–7800)
Neutrophils Relative %: 64 %
Platelets: 123 10*3/uL — ABNORMAL LOW (ref 140–400)
RBC: 3.64 10*6/uL — ABNORMAL LOW (ref 3.80–5.10)
RDW: 11.6 % (ref 11.0–15.0)
Total Lymphocyte: 23.1 %
WBC: 5.6 10*3/uL (ref 3.8–10.8)

## 2019-12-30 LAB — COMPLETE METABOLIC PANEL WITH GFR
AG Ratio: 1.7 (calc) (ref 1.0–2.5)
ALT: 17 U/L (ref 6–29)
AST: 21 U/L (ref 10–35)
Albumin: 4 g/dL (ref 3.6–5.1)
Alkaline phosphatase (APISO): 56 U/L (ref 37–153)
BUN: 12 mg/dL (ref 7–25)
CO2: 31 mmol/L (ref 20–32)
Calcium: 9.1 mg/dL (ref 8.6–10.4)
Chloride: 104 mmol/L (ref 98–110)
Creat: 0.91 mg/dL (ref 0.50–0.99)
GFR, Est African American: 77 mL/min/{1.73_m2} (ref >=60)
GFR, Est Non African American: 66 mL/min/{1.73_m2} (ref >=60)
Globulin: 2.4 g/dL (calc) (ref 1.9–3.7)
Glucose, Bld: 80 mg/dL (ref 65–99)
Potassium: 4 mmol/L (ref 3.5–5.3)
Sodium: 141 mmol/L (ref 135–146)
Total Bilirubin: 0.5 mg/dL (ref 0.2–1.2)
Total Protein: 6.4 g/dL (ref 6.1–8.1)

## 2019-12-30 LAB — TSH: TSH: 6.86 mIU/L — ABNORMAL HIGH (ref 0.40–4.50)

## 2019-12-30 LAB — URINE CULTURE
MICRO NUMBER:: 10092601
SPECIMEN QUALITY:: ADEQUATE

## 2019-12-30 LAB — VITAMIN D 25 HYDROXY (VIT D DEFICIENCY, FRACTURES): Vit D, 25-Hydroxy: 75 ng/mL (ref 30–100)

## 2019-12-30 LAB — MAGNESIUM: Magnesium: 2.2 mg/dL (ref 1.5–2.5)

## 2020-01-06 ENCOUNTER — Ambulatory Visit: Payer: 59

## 2020-01-30 ENCOUNTER — Ambulatory Visit (INDEPENDENT_AMBULATORY_CARE_PROVIDER_SITE_OTHER): Payer: Medicare HMO

## 2020-01-30 ENCOUNTER — Other Ambulatory Visit: Payer: Self-pay

## 2020-01-30 VITALS — BP 106/70 | HR 77 | Temp 97.3°F | Wt 129.0 lb

## 2020-01-30 DIAGNOSIS — Z79899 Other long term (current) drug therapy: Secondary | ICD-10-CM

## 2020-01-30 DIAGNOSIS — N390 Urinary tract infection, site not specified: Secondary | ICD-10-CM

## 2020-01-30 NOTE — Progress Notes (Signed)
Patient presents to the office for a nurse visit. Patient recently had been treated for a UTI and completed the course of antibiotics. No signs or symptoms. Vitals taken and recorded.

## 2020-01-31 LAB — URINALYSIS, ROUTINE W REFLEX MICROSCOPIC
Bilirubin Urine: NEGATIVE
Glucose, UA: NEGATIVE
Hgb urine dipstick: NEGATIVE
Ketones, ur: NEGATIVE
Leukocytes,Ua: NEGATIVE
Nitrite: NEGATIVE
Protein, ur: NEGATIVE
Specific Gravity, Urine: 1.013 (ref 1.001–1.03)
pH: 5.5 (ref 5.0–8.0)

## 2020-01-31 LAB — URINE CULTURE
MICRO NUMBER:: 10206157
Result:: NO GROWTH
SPECIMEN QUALITY:: ADEQUATE

## 2020-02-08 DIAGNOSIS — H2513 Age-related nuclear cataract, bilateral: Secondary | ICD-10-CM | POA: Diagnosis not present

## 2020-02-08 DIAGNOSIS — H04123 Dry eye syndrome of bilateral lacrimal glands: Secondary | ICD-10-CM | POA: Diagnosis not present

## 2020-02-21 ENCOUNTER — Other Ambulatory Visit: Payer: Self-pay | Admitting: Physician Assistant

## 2020-03-11 DIAGNOSIS — R69 Illness, unspecified: Secondary | ICD-10-CM | POA: Diagnosis not present

## 2020-03-14 DIAGNOSIS — N9089 Other specified noninflammatory disorders of vulva and perineum: Secondary | ICD-10-CM | POA: Diagnosis not present

## 2020-03-14 DIAGNOSIS — R69 Illness, unspecified: Secondary | ICD-10-CM | POA: Diagnosis not present

## 2020-04-10 NOTE — Progress Notes (Signed)
MEDICARE WELLNESS [phone]  Assessment and Plan:  MEDICARE WELLNESS 1 year  Hyperlipidemia, unspecified hyperlipidemia type -     Lipid panel check lipids decrease fatty foods increase activity.   Essential hypertension -     CBC with Differential/Platelet -     COMPLETE METABOLIC PANEL WITH GFR -     Urinalysis, Routine w reflex microscopic -     Microalbumin / creatinine urine ratio -     EKG 12-Lead - continue medications, DASH diet, exercise and monitor at home. Call if greater than 130/80.   Hypothyroidism due to Hashimoto's thyroiditis -     TSH Hypothyroidism-check TSH level, continue medications the same, reminded to take on an empty stomach 30-45mins before food.   Abnormal glucose With better eating and purposeful weight loss will defer A1C  Vitamin D deficiency -     VITAMIN D 25 Hydroxy (Vit-D Deficiency, Fractures)  Basal cell carcinoma (BCC), unspecified site Monitor  Allergic rhinitis, unspecified seasonality, unspecified trigger - Allegra OTC, increase H20, allergy hygiene explained.  Stage 3 chronic kidney disease, unspecified whether stage 3a or 3b CKD -     COMPLETE METABOLIC PANEL WITH GFR -     Urinalysis, Routine w reflex microscopic -     Microalbumin / creatinine urine ratio -     Iron,Total/Total Iron Binding Cap Increase fluids, avoid NSAIDS, monitor sugars, will monitor  Medication management -     Magnesium    Discussed med's effects and SE's. Screening labs and tests as requested with regular follow-up as recommended. Future Appointments  Date Time Provider [place]  10/01/2020  9:00 AM Vicie Mutters, PA-C [facility] None  04/22/2021  9:00 AM Vicie Mutters, PA-C [facility] None    Medicare Attestation I have personally reviewed: The patient's medical and social history Their use of alcohol, tobacco or illicit drugs Their current medications and supplements The patient's functional ability including ADLs,fall risks,  home safety risks, cognitive, and hearing and visual impairment Diet and physical activities Evidence for depression or mood disorders  The patient's weight, height, BMI, and visual acuity have been recorded in the chart.  I have made referrals, counseling, and provided education to the patient based on review of the above and I have provided the patient with a written personalized care plan for preventive services.    MEDICARE WELLNESS OBJECTIVES: Physical activity: Current Exercise Habits: Home exercise routine, Type of exercise: walking, Time (Minutes): 30, Frequency (Times/Week): 4, Weekly Exercise (Minutes/Week): 120, Intensity: Moderate Cardiac risk factors: Cardiac Risk Factors include: advanced age (>82men, >27 women);dyslipidemia;hypertension Depression/mood screen:   Depression screen [facility] 2/9 04/11/2020  Decreased Interest 0  Down, Depressed, Hopeless 0  PHQ - 2 Score 0    ADLs:  In your present state of health, do you have any difficulty performing the following activities: 04/11/2020 07/07/2019  Hearing? Y N  Comment going to see Dr. Audery Amel for her right ear -  Vision? N N  Difficulty concentrating or making decisions? N N  Walking or climbing stairs? N N  Dressing or bathing? N N  Doing errands, shopping? N N  Preparing Food and eating ? - N  Using the Toilet? - N  In the past six months, have you accidently leaked urine? - N  Do you have problems with loss of bowel control? - N  Managing your Medications? - N  Managing your Finances? - N  Housekeeping or managing your Housekeeping? - N  Some recent data might be hidden  Cognitive Testing  Alert? Yes  Normal Appearance?Yes  Oriented to person? Yes  Place? Yes   Time? Yes  Recall of three objects?  Yes  Can perform simple calculations? Yes  Displays appropriate judgment?Yes  Can read the correct time from a watch face?Yes  EOL planning: Does Patient Have a Medical Advance Directive?: Yes Type of Advance  Directive: Healthcare Power of Attorney, Living will Does patient want to make changes to medical advance directive?: No - Patient declined Copy of [place] in Chart?: No - copy requested    HPI  64 y.o. female  presents for a [place] [employer]  She states she has been more tired. She states she does not sleep well at night, if she gets at least 4 hours she is good. She takes melatonin for sleep. She has an issue getting to sleeps, goes to lay down around 12 and will take 1 hour to get to sleep. She wakes up fatigued in the AM. No snoring or sounds like OSA.   We changed her thyroid last visit, she is on 25 mcg on Sunday.  Lab Results  Component Value Date   TSH 6.86 (H) 12/28/2019   BMI is Body mass index is 23.26 kg/m., she is working on diet and exercise. She has been on a no sugar, just veggies and fruit x Jan and has lot 40+ lbs on purpose.  Wt Readings from Last 10 Encounters:  04/11/20 133 lb 6.4 oz (60.5 kg)  01/30/20 129 lb (58.5 kg)  12/28/19 126 lb (57.2 kg)  09/20/19 121 lb 3.2 oz (55 kg)  08/14/19 121 lb 12.8 oz (55.2 kg)  07/07/19 122 lb (55.3 kg)  05/30/19 126 lb 3.2 oz (57.2 kg)  02/23/19 146 lb (66.2 kg)  01/06/19 154 lb (69.9 kg)  11/15/18 164 lb 6.4 oz (74.6 kg)   Lab Results  Component Value Date   VITAMINB12 415 09/20/2019   Lab Results  Component Value Date   IRON 58 09/20/2019   TIBC 291 09/20/2019   Her blood pressure has been controlled at home, today their BP is BP: 124/82.  She does workout, 1 hour daily walking. She denies chest pain, shortness of breath, dizziness.    She is on cholesterol medication, crestor 10mg  and denies myalgias. Her cholesterol is at goal. The cholesterol last visit was:  Lab Results  Component Value Date   CHOL 153 12/28/2019   HDL 79 12/28/2019   LDLCALC 60 12/28/2019   TRIG 61 12/28/2019   CHOLHDL 1.9 12/28/2019  . She has been working on diet and exercise for prediabetes, . Last  A1C in the office was:  Lab Results  Component Value Date   HGBA1C 5.3 05/30/2019   Patient is on Vitamin D supplement, 1500 IU a day.   Lab Results  Component Value Date   VD25OH 17 12/28/2019      She still sees Dr. Marvel Plan her Obgyn once yearly, went recently for and going to start vaginal estrogen cream for vaginal atrophy   Current Medications:   Current Outpatient Medications (Endocrine & Metabolic):  .  levothyroxine (SYNTHROID) 25 MCG tablet, Take 1 tab daily on empty stomach daily except Monday and Thursday take 1.5 tabs. Take with water only and nothing for at least 45 min after for optimal absorption.  Current Outpatient Medications (Cardiovascular):  .  rosuvastatin (CRESTOR) 10 MG tablet, TAKE 1 TABLET BY MOUTH EVERY DAY  Current Outpatient Medications (Respiratory):  .  cetirizine (ZYRTEC) 10 MG tablet, Take 10 mg by mouth daily.    Current Outpatient Medications (Other):  Marland Kitchen  Cholecalciferol (VITAMIN D3) 125 MCG (5000 UT) CAPS, Take by mouth daily. .  Multiple Vitamins-Minerals (ONE DAILY CALCIUM/IRON) TABS, Take by mouth daily.  Health Maintenance:   Immunization History  Administered Date(s) Administered  . Influenza Inj Mdck Quad With Preservative 09/06/2017, 09/12/2018  . Influenza Whole 09/02/2013  . Influenza, High Dose Seasonal PF 08/31/2019  . Influenza, Seasonal, Injecte, Preservative Fre 11/08/2015  . Influenza-Unspecified 07/29/2016  . PFIZER SARS-COV-2 Vaccination 01/02/2020, 02/01/2020  . PPD Test 04/24/2014  . Pneumococcal Conjugate-13 05/30/2019  . Pneumococcal Polysaccharide-23 04/13/2013  . Tdap 10/16/2008, 09/06/2017  . Zoster Recombinat (Shingrix) 08/31/2019, 02/21/2020   Tetanus: 2018 Pneumovax: 2014 Prevnar 13 2020 Flu vaccine: 2020 Shingrix 08/2019 COVID completed  Pap: Jan 2021 MGM: 08/2019 DEXA: 07/2018 osteopenia -1.2 at [facility] Colonoscopy: 2011 repeat 10 years- had benign 1 polyp- will schedule  Last Dental Exam: Once  yearly, Dr. Trenton Gammon Last Eye Exam: Unknown 2021  Patient Care Team: Unk Pinto, MD as PCP - General (Internal Medicine) Irene Shipper, MD as Consulting Physician (Gastroenterology) Danella Sensing, MD as Consulting Physician (Dermatology) Rozetta Nunnery, MD as Consulting Physician (Otolaryngology) Paula Compton, MD as Consulting Physician (Obstetrics and Gynecology)  Medical History:  Past Medical History:  Diagnosis Date  . Allergic rhinitis   . Basal cell carcinoma   . Hyperlipidemia   . Thyroid disease   . Vitamin D deficiency    Allergies No Known Allergies  SURGICAL HISTORY She  has a past surgical history that includes Tympanoplasty (Left). FAMILY HISTORY Her family history includes Cancer in her maternal uncle; Cancer (age of onset: 82) in her sister; Heart attack in her mother; Heart disease in her mother; Hyperlipidemia in her mother; Hypertension in her mother; Varicose Veins in her sister. SOCIAL HISTORY She  reports that she has never smoked. She has never used smokeless tobacco. She reports that she does not drink alcohol or use drugs.   Review of Systems: Review of Systems  Constitutional: Negative for chills, fever and malaise/fatigue.  HENT: Negative for congestion, ear pain and sore throat.   Eyes: Negative.   Respiratory: Negative for cough, shortness of breath and wheezing.   Cardiovascular: Negative for chest pain, palpitations and leg swelling.  Gastrointestinal: Negative for abdominal pain, blood in stool, constipation, diarrhea, heartburn and melena.  Genitourinary: Negative.   Musculoskeletal: Negative for back pain, falls, joint pain, myalgias and neck pain.  Skin: Negative.   Neurological: Negative for dizziness, sensory change, loss of consciousness and headaches.  Psychiatric/Behavioral: Negative for depression. The patient is not nervous/anxious and does not have insomnia.     Physical Exam: Estimated body mass index is 23.26  kg/m as calculated from the following:   Height as of this encounter: 5' 3.5" (1.613 m).   Weight as of this encounter: 133 lb 6.4 oz (60.5 kg). BP 124/82   Pulse 63   Temp 98.1 F (36.7 C)   Ht 5' 3.5" (1.613 m)   Wt 133 lb 6.4 oz (60.5 kg)   SpO2 95%   BMI 23.26 kg/m   General Appearance: Well nourished well developed, in no apparent distress.  Eyes: PERRLA, EOMs, conjunctiva no swelling or erythema ENT/Mouth: Ear canals normal without obstruction, swelling, erythema, or discharge.  TMs normal bilaterally with no erythema, bulging, retraction, or loss of landmark.  Oropharynx moist and clear with no exudate, erythema, or swelling.  Neck: Supple, thyroid normal. No bruits.  No cervical adenopathy Respiratory: Respiratory effort normal, Breath sounds clear A&P without wheeze, rhonchi, rales.   Cardio: RRR without murmurs, rubs or gallops. Brisk peripheral pulses without edema.  Chest: symmetric, with normal excursions Breasts: Symmetric, without lumps, nipple discharge, retractions.  Abdomen: Soft, nontender, no guarding, rebound, hernias, masses, or organomegaly.  Lymphatics: Non tender without lymphadenopathy.  Musculoskeletal: Full ROM all peripheral extremities,5/5 strength, and normal gait. Skin: Warm, dry without rashes, lesions, ecchymosis. Neuro: Awake and oriented X 3, Cranial nerves intact, reflexes equal bilaterally. Normal muscle tone, no cerebellar symptoms. Sensation intact.  Psych:  normal affect, Insight and Judgment appropriate.   Vicie Mutters 11:39 AM [facility]

## 2020-04-11 ENCOUNTER — Encounter: Payer: Self-pay | Admitting: Physician Assistant

## 2020-04-11 ENCOUNTER — Other Ambulatory Visit: Payer: Self-pay

## 2020-04-11 ENCOUNTER — Ambulatory Visit (INDEPENDENT_AMBULATORY_CARE_PROVIDER_SITE_OTHER): Payer: Medicare HMO | Admitting: Physician Assistant

## 2020-04-11 VITALS — BP 124/82 | HR 63 | Temp 98.1°F | Ht 63.5 in | Wt 133.4 lb

## 2020-04-11 DIAGNOSIS — I10 Essential (primary) hypertension: Secondary | ICD-10-CM | POA: Diagnosis not present

## 2020-04-11 DIAGNOSIS — E038 Other specified hypothyroidism: Secondary | ICD-10-CM | POA: Diagnosis not present

## 2020-04-11 DIAGNOSIS — M8580 Other specified disorders of bone density and structure, unspecified site: Secondary | ICD-10-CM | POA: Diagnosis not present

## 2020-04-11 DIAGNOSIS — E559 Vitamin D deficiency, unspecified: Secondary | ICD-10-CM

## 2020-04-11 DIAGNOSIS — R6889 Other general symptoms and signs: Secondary | ICD-10-CM | POA: Diagnosis not present

## 2020-04-11 DIAGNOSIS — C4491 Basal cell carcinoma of skin, unspecified: Secondary | ICD-10-CM | POA: Diagnosis not present

## 2020-04-11 DIAGNOSIS — E063 Autoimmune thyroiditis: Secondary | ICD-10-CM

## 2020-04-11 DIAGNOSIS — E785 Hyperlipidemia, unspecified: Secondary | ICD-10-CM | POA: Diagnosis not present

## 2020-04-11 DIAGNOSIS — Z0000 Encounter for general adult medical examination without abnormal findings: Secondary | ICD-10-CM

## 2020-04-11 DIAGNOSIS — J309 Allergic rhinitis, unspecified: Secondary | ICD-10-CM

## 2020-04-11 DIAGNOSIS — Z0001 Encounter for general adult medical examination with abnormal findings: Secondary | ICD-10-CM

## 2020-04-11 DIAGNOSIS — D649 Anemia, unspecified: Secondary | ICD-10-CM | POA: Diagnosis not present

## 2020-04-11 DIAGNOSIS — R7309 Other abnormal glucose: Secondary | ICD-10-CM | POA: Diagnosis not present

## 2020-04-11 DIAGNOSIS — Z79899 Other long term (current) drug therapy: Secondary | ICD-10-CM | POA: Diagnosis not present

## 2020-04-11 NOTE — Patient Instructions (Addendum)
VAGINAL DRYNESS OVERVIEW  Vaginal dryness, also known as atrophic vaginitis, is a common condition in postmenopausal women. This condition is also common in women who have had both ovaries removed at the time of hysterectomy.   Some women have uncomfortable symptoms of vaginal dryness, such as pain with sex, burning vaginal discomfort or itching, or abnormal vaginal discharge, while others have no symptoms at all.  VAGINAL DRYNESS CAUSES   Estrogen helps to keep the vagina moist and to maintain thickness of the vaginal lining. Vaginal dryness occurs when the ovaries produce a decreased amount of estrogen. This can occur at certain times in a woman's life, and may be permanent or temporary. Times when less estrogen is made include: ?At the time of menopause. ?After surgical removal of the ovaries, chemotherapy, or radiation therapy of the pelvis for cancer. ?After having a baby, particularly in women who breastfeed. ?While using certain medications, such as danazol, medroxyprogesterone (brand names: Provera or DepoProvera), leuprolide (brand name: Lupron), or nafarelin. When these medications are stopped, estrogen production resumes.  Women who smoke cigarettes have been shown to have an increased risk of an earlier menopause transition as compared to non-smokers. Therefore, atrophic vaginitis symptoms may appear at a younger age in this population.  VAGINAL DRYNESS TREATMENT   There are three treatment options for women with vaginal dryness:  Vaginal lubricants and moisturizers -- Vaginal lubricants and moisturizers can be purchased without a prescription. These products do not contain any hormones and have virtually no side effects. - Albolene is found in the facial cleanser section at [facility], [facility], or [employer]. It is a large jar with a blue top. This is the best lubricant for women because it is hypoallergenic. -Natural lubricants, such as olive, avocado or peanut oil, are easily available  products that may be used as a lubricant with sex.   VARICOSE VEINS Varicose veins are veins that have become enlarged and twisted. CAUSES This condition is the result of valves in the veins not working properly. Valves in the veins help return blood from the leg to the heart. When your calf muscles squeeze, the blood moves up your leg then the valves close and this continues until the blood gets back to your heart.  If these valves are damaged, blood flows backwards and backs up into the veins in the leg near the skin OR if your are sitting/standing for a long time without using your calf muscles the blood will back up into the veins in your legs. This causes the veins to become larger. People who are on their feet a lot, sit a lot without walking (like on a plane, at a desk, or in a car), who are pregnant, or who are overweight are more likely to develop varicose veins. SYMPTOMS   Bulging, twisted-appearing, bluish veins, most commonly found on the legs.  Leg pain or a feeling of heaviness. These symptoms may be worse at the end of the day.  Leg swelling.  Skin color changes. DIAGNOSIS  Varicose veins can usually be diagnosed with an exam of your legs by your caregiver. He or she may recommend an ultrasound of your leg veins. TREATMENT  Most varicose veins can be treated at home. However, other treatments are available for people who have persistent symptoms or who want to treat the cosmetic appearance of the varicose veins. But this is only cosmetic and they will return if not properly treated. These include:  Laser treatment of very small varicose veins.  Medicine that  is shot (injected) into the vein. This medicine hardens the walls of the vein and closes off the vein. This treatment is called sclerotherapy. Afterwards, you may need to wear clothing or bandages that apply pressure.  Surgery. HOME CARE INSTRUCTIONS   Do not stand or sit in one position for long periods of time. Do not  sit with your legs crossed. Rest with your legs raised during the day.  Your legs have to be higher than your heart so that gravity will force the valves to open, so please really elevate your legs.   Wear elastic stockings or support hose. Do not wear other tight, encircling garments around the legs, pelvis, or waist.  [employer]  has a wide variety of well priced compression stockings. [place], [place] [phone]  OR THERE ARE COPPER INFUSED COMPRESSION SOCKS AT [facility] OR [facility]  [employer] also has great cheap/afforable stockings or socks- the socks are easier to get on your feet  - can also get a jacob's donner that helps you put on the sock  Walk as much as possible to increase blood flow.  Raise the foot of your bed at night with 2-inch blocks.  If you get a cut in the skin over the vein and the vein bleeds, lie down with your leg raised and press on it with a clean cloth until the bleeding stops. Then place a bandage (dressing) on the cut. See your caregiver if it continues to bleed or needs stitches. SEEK MEDICAL CARE IF:   The skin around your ankle starts to break down.  You have pain, redness, tenderness, or hard swelling developing in your leg over a vein.  You are uncomfortable due to leg pain. Document Released: 08/26/2005 Document Revised: 02/08/2012 Document Reviewed: 01/12/2011 [facility] [employer] Patient Information 2014 [place].

## 2020-04-13 LAB — LIPID PANEL
Cholesterol: 156 mg/dL (ref <200)
HDL: 85 mg/dL (ref >=50)
LDL Cholesterol (Calc): 62 mg/dL (calc)
Non-HDL Cholesterol (Calc): 71 mg/dL (calc) (ref <130)
Total CHOL/HDL Ratio: 1.8 (calc) (ref <5.0)
Triglycerides: 33 mg/dL (ref <150)

## 2020-04-13 LAB — CBC WITH DIFFERENTIAL/PLATELET
Absolute Monocytes: 386 cells/uL (ref 200–950)
Basophils Absolute: 51 cells/uL (ref 0–200)
Basophils Relative: 1.1 %
Eosinophils Absolute: 359 cells/uL (ref 15–500)
Eosinophils Relative: 7.8 %
HCT: 34.3 % — ABNORMAL LOW (ref 35.0–45.0)
Hemoglobin: 11.6 g/dL — ABNORMAL LOW (ref 11.7–15.5)
Lymphs Abs: 1578 cells/uL (ref 850–3900)
MCH: 33.1 pg — ABNORMAL HIGH (ref 27.0–33.0)
MCHC: 33.8 g/dL (ref 32.0–36.0)
MCV: 98 fL (ref 80.0–100.0)
MPV: 11.1 fL (ref 7.5–12.5)
Monocytes Relative: 8.4 %
Neutro Abs: 2226 cells/uL (ref 1500–7800)
Neutrophils Relative %: 48.4 %
Platelets: 152 10*3/uL (ref 140–400)
RBC: 3.5 10*6/uL — ABNORMAL LOW (ref 3.80–5.10)
RDW: 11.8 % (ref 11.0–15.0)
Total Lymphocyte: 34.3 %
WBC: 4.6 10*3/uL (ref 3.8–10.8)

## 2020-04-13 LAB — COMPLETE METABOLIC PANEL WITH GFR
AG Ratio: 1.7 (calc) (ref 1.0–2.5)
ALT: 13 U/L (ref 6–29)
AST: 17 U/L (ref 10–35)
Albumin: 3.9 g/dL (ref 3.6–5.1)
Alkaline phosphatase (APISO): 50 U/L (ref 37–153)
BUN: 19 mg/dL (ref 7–25)
CO2: 30 mmol/L (ref 20–32)
Calcium: 8.7 mg/dL (ref 8.6–10.4)
Chloride: 105 mmol/L (ref 98–110)
Creat: 0.8 mg/dL (ref 0.50–0.99)
GFR, Est African American: 89 mL/min/{1.73_m2} (ref >=60)
GFR, Est Non African American: 77 mL/min/{1.73_m2} (ref >=60)
Globulin: 2.3 g/dL (calc) (ref 1.9–3.7)
Glucose, Bld: 87 mg/dL (ref 65–99)
Potassium: 4.4 mmol/L (ref 3.5–5.3)
Sodium: 139 mmol/L (ref 135–146)
Total Bilirubin: 0.5 mg/dL (ref 0.2–1.2)
Total Protein: 6.2 g/dL (ref 6.1–8.1)

## 2020-04-13 LAB — TEST AUTHORIZATION

## 2020-04-13 LAB — IRON,TIBC AND FERRITIN PANEL
%SAT: 33 % (calc) (ref 16–45)
Ferritin: 67 ng/mL (ref 16–288)
Iron: 103 ug/dL (ref 45–160)
TIBC: 310 mcg/dL (calc) (ref 250–450)

## 2020-04-13 LAB — MAGNESIUM: Magnesium: 2.1 mg/dL (ref 1.5–2.5)

## 2020-04-13 LAB — TSH: TSH: 6.14 mIU/L — ABNORMAL HIGH (ref 0.40–4.50)

## 2020-04-13 LAB — VITAMIN D 25 HYDROXY (VIT D DEFICIENCY, FRACTURES): Vit D, 25-Hydroxy: 58 ng/mL (ref 30–100)

## 2020-05-06 ENCOUNTER — Encounter (INDEPENDENT_AMBULATORY_CARE_PROVIDER_SITE_OTHER): Payer: Self-pay | Admitting: Otolaryngology

## 2020-05-06 ENCOUNTER — Ambulatory Visit (INDEPENDENT_AMBULATORY_CARE_PROVIDER_SITE_OTHER): Payer: Medicare HMO | Admitting: Otolaryngology

## 2020-05-06 ENCOUNTER — Other Ambulatory Visit: Payer: Self-pay

## 2020-05-06 VITALS — Temp 97.3°F

## 2020-05-06 DIAGNOSIS — H722X1 Other marginal perforations of tympanic membrane, right ear: Secondary | ICD-10-CM

## 2020-05-06 NOTE — Progress Notes (Signed)
HPI: Tracy Brady is a 64 y.o. 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  She had previous right tympanoplasty with Dr Benjamine Mola about 4 years ago that apparently was unsuccessful.  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  She had conductive loss in the lower frequencies of approximately 20 dB on the left side.  She has had no drainage from the ear. She has noted progressive hearing loss. She is otherwise healthy. NKDA  Past Medical History:  Diagnosis Date   Allergic rhinitis    Basal cell carcinoma    Hyperlipidemia    Thyroid disease    Vitamin D deficiency    Past Surgical History:  Procedure Laterality Date   TYMPANOPLASTY Left    Social History   Socioeconomic History   Marital status: Married    Spouse name: Not on file   Number of children: Not on file   Years of education: Not on file   Highest education level: Not on file  Occupational History   Not on file  Tobacco Use   Smoking status: Never Smoker   Smokeless tobacco: Never Used  Substance and Sexual Activity   Alcohol use: No   Drug use: No   Sexual activity: Not on file  Other Topics Concern   Not on file  Social History Narrative   Not on file   Social Determinants of Health   Financial Resource Strain:    Difficulty of Paying Living Expenses:   Food Insecurity:    Worried About Charity fundraiser in the Last Year:    Arboriculturist in the Last Year:   Transportation Needs:    Film/video editor (Medical):    Lack of Transportation (Non-Medical):   Physical Activity:    Days of Exercise per Week:    Minutes of Exercise per Session:   Stress:    Feeling of Stress :   Social Connections:    Frequency of Communication with Friends and Family:    Frequency of Social  Gatherings with Friends and Family:    Attends Religious Services:    Active Member of Clubs or Organizations:    Attends Music therapist:    Marital Status:    Family History  Problem Relation Age of Onset   Hypertension Mother    Heart disease Mother    Hyperlipidemia Mother    Heart attack Mother    Cancer Maternal Uncle        Melanoma, had eye removed   Varicose Veins Sister    Cancer Sister 21       breast cancer   No Known Allergies Prior to Admission medications   Medication Sig Start Date End Date Taking? Authorizing Provider  cetirizine (ZYRTEC) 10 MG tablet Take 10 mg by mouth daily.   Yes [provider]  Cholecalciferol (VITAMIN D3) 125 MCG (5000 UT) CAPS Take by mouth daily.   Yes [provider]  levothyroxine (SYNTHROID) 25 MCG tablet Take 1 tab daily on empty stomach daily except Monday and Thursday take 1.5 tabs. Take with water only and nothing for at least 45 min after for optimal absorption. 06/30/19  Yes Liane Comber, NP  Multiple Vitamins-Minerals (ONE DAILY CALCIUM/IRON) TABS Take by mouth daily.  Yes [provider]  rosuvastatin (CRESTOR) 10 MG tablet TAKE 1 TABLET BY MOUTH EVERY DAY 02/21/20  Yes Vicie Mutters, PA-C     Positive ROS: Otherwise negative  All other systems have been reviewed and were otherwise negative with the exception of those mentioned in the HPI and as above.  Physical Exam: Constitutional: Alert, well-appearing, no acute distress Ears: External ears without lesions or tenderness. Ear canals are clear bilaterally.  Left TM is slightly retracted posteriorly with the TM lying adjacent to the incus which was at intact.  No middle ear effusion noted.  On the right side she has a moderate size posterior perforation that extends to the edge posteriorly.  She appears to possibly have a cut chorda like adjacent to the malleus anteriorly.  The incus appears intact.  No drainage noted and  no obvious cholesteatoma noted.  She has tympanosclerosis anteriorly.  Middle ear space appears dry otherwise. Nasal: External nose without lesions. Septum midline.. Clear nasal passages Oral: Lips and gums without lesions. Tongue and palate mucosa without lesions. Posterior oropharynx clear. Neck: No palpable adenopathy or masses Respiratory: Breathing comfortably.  Lungs are clear to auscultation. Cardiac exam: Regular rate and rhythm without murmur. Skin: No facial/neck lesions or rash noted.  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 We will plan on scheduling audiogram prior to tympanoplasty which will be scheduled at her convenience.   Radene Journey, MD

## 2020-05-13 DIAGNOSIS — H906 Mixed conductive and sensorineural hearing loss, bilateral: Secondary | ICD-10-CM | POA: Diagnosis not present

## 2020-05-22 ENCOUNTER — Other Ambulatory Visit: Payer: Self-pay | Admitting: Physician Assistant

## 2020-05-22 ENCOUNTER — Other Ambulatory Visit: Payer: Self-pay | Admitting: Adult Health Nurse Practitioner

## 2020-05-30 ENCOUNTER — Other Ambulatory Visit: Payer: Self-pay

## 2020-05-30 ENCOUNTER — Encounter (HOSPITAL_BASED_OUTPATIENT_CLINIC_OR_DEPARTMENT_OTHER): Payer: Self-pay | Admitting: Otolaryngology

## 2020-05-31 ENCOUNTER — Ambulatory Visit (INDEPENDENT_AMBULATORY_CARE_PROVIDER_SITE_OTHER): Payer: Self-pay | Admitting: Otolaryngology

## 2020-05-31 DIAGNOSIS — H722X1 Other marginal perforations of tympanic membrane, right ear: Secondary | ICD-10-CM

## 2020-05-31 NOTE — H&P (View-Only) (Signed)
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 female who presents for evaluation of chronic right tympanic membrane perforation with hearing loss.  She had a previous repair of the perforation that was unsuccessful performed 4 years ago.  Hearing test shows a moderate low-frequency conductive loss in the right ear.  She feels like this has gradually gotten worse.  On exam she has a large posterior superior tympanic membrane perforation that extends to the margin.  She is taken to the operating room at this time for right tympanoplasty.  She has had no drainage from the ear.  Past Medical History:  Diagnosis Date  . Allergic rhinitis   . Basal cell carcinoma   . Hyperlipidemia   . Hypothyroidism   . PONV (postoperative nausea and vomiting)   . Thyroid disease   . Vitamin D deficiency    Past Surgical History:  Procedure Laterality Date  . TYMPANOPLASTY Left    Social History   Socioeconomic History  . Marital status: Married    Spouse name: Not on file  . Number of children: Not on file  . Years of education: Not on file  . Highest education level: Not on file  Occupational History  . Not on file  Tobacco Use  . Smoking status: Never Smoker  . Smokeless tobacco: Never Used  Vaping Use  . Vaping Use: Never used  Substance and Sexual Activity  . Alcohol use: No  . Drug use: No  . Sexual activity: Not on file  Other Topics Concern  . Not on file  Social History Narrative  . Not on file   Social Determinants of Health   Financial Resource Strain:   . Difficulty of Paying Living Expenses:   Food Insecurity:   . Worried About Charity fundraiser in the Last Year:   . Arboriculturist in the Last Year:   Transportation Needs:   . Film/video editor (Medical):   Marland Kitchen Lack of Transportation (Non-Medical):   Physical Activity:   . Days of Exercise per Week:   . Minutes of Exercise per  Session:   Stress:   . Feeling of Stress :   Social Connections:   . Frequency of Communication with Friends and Family:   . Frequency of Social Gatherings with Friends and Family:   . Attends Religious Services:   . Active Member of Clubs or Organizations:   . Attends Archivist Meetings:   Marland Kitchen Marital Status:    Family History  Problem Relation Age of Onset  . Hypertension Mother   . Heart disease Mother   . Hyperlipidemia Mother   . Heart attack Mother   . Cancer Maternal Uncle        Melanoma, had eye removed  . Varicose Veins Sister   . Cancer Sister 78       breast cancer   No Known Allergies Prior to Admission medications   Medication Sig Start Date End Date Taking? Authorizing Provider  cetirizine (ZYRTEC) 10 MG tablet Take 10 mg by mouth daily.    [provider]  Cholecalciferol (VITAMIN D3) 125 MCG (5000 UT) CAPS Take by mouth daily.    [provider]  levothyroxine (SYNTHROID) 25 MCG tablet Take 1 tablet daily on an empty stomach with only water for 30 minutes & no Antacid meds, Calcium or Magnesium for 4 hours & avoid Biotin 05/22/20  Unk Pinto, MD  Multiple Vitamins-Minerals (ONE DAILY CALCIUM/IRON) TABS Take by mouth daily.    [provider]  rosuvastatin (CRESTOR) 10 MG tablet Take 1 tablet Daily for Cholesterol 05/22/20   Unk Pinto, MD     Positive ROS: Otherwise negative  All other systems have been reviewed and were otherwise negative with the exception of those mentioned in the HPI and as above.  Physical Exam: There were no vitals filed for this visit.  General: Alert, no acute distress Oral: Normal oral mucosa and tonsils Nasal: Clear nasal passages Neck: No palpable adenopathy or thyroid nodules Ear: Ear canal is clear.  Patient with a moderate size posterior superior right tympanic membrane perforation that extends to the margin.  Middle ear appears clear otherwise. Cardiovascular: Regular rate and  rhythm, no murmur.  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   Melony Overly, MD 05/31/2020 10:19 AM

## 2020-05-31 NOTE — H&P (Signed)
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 female who presents for evaluation of chronic right tympanic membrane perforation with hearing loss.  She had a previous repair of the perforation that was unsuccessful performed 4 years ago.  Hearing test shows a moderate low-frequency conductive loss in the right ear.  She feels like this has gradually gotten worse.  On exam she has a large posterior superior tympanic membrane perforation that extends to the margin.  She is taken to the operating room at this time for right tympanoplasty.  She has had no drainage from the ear.  Past Medical History:  Diagnosis Date  . Allergic rhinitis   . Basal cell carcinoma   . Hyperlipidemia   . Hypothyroidism   . PONV (postoperative nausea and vomiting)   . Thyroid disease   . Vitamin D deficiency    Past Surgical History:  Procedure Laterality Date  . TYMPANOPLASTY Left    Social History   Socioeconomic History  . Marital status: Married    Spouse name: Not on file  . Number of children: Not on file  . Years of education: Not on file  . Highest education level: Not on file  Occupational History  . Not on file  Tobacco Use  . Smoking status: Never Smoker  . Smokeless tobacco: Never Used  Vaping Use  . Vaping Use: Never used  Substance and Sexual Activity  . Alcohol use: No  . Drug use: No  . Sexual activity: Not on file  Other Topics Concern  . Not on file  Social History Narrative  . Not on file   Social Determinants of Health   Financial Resource Strain:   . Difficulty of Paying Living Expenses:   Food Insecurity:   . Worried About Charity fundraiser in the Last Year:   . Arboriculturist in the Last Year:   Transportation Needs:   . Film/video editor (Medical):   Marland Kitchen Lack of Transportation (Non-Medical):   Physical Activity:   . Days of Exercise per Week:   . Minutes of Exercise per  Session:   Stress:   . Feeling of Stress :   Social Connections:   . Frequency of Communication with Friends and Family:   . Frequency of Social Gatherings with Friends and Family:   . Attends Religious Services:   . Active Member of Clubs or Organizations:   . Attends Archivist Meetings:   Marland Kitchen Marital Status:    Family History  Problem Relation Age of Onset  . Hypertension Mother   . Heart disease Mother   . Hyperlipidemia Mother   . Heart attack Mother   . Cancer Maternal Uncle        Melanoma, had eye removed  . Varicose Veins Sister   . Cancer Sister 29       breast cancer   No Known Allergies Prior to Admission medications   Medication Sig Start Date End Date Taking? Authorizing Provider  cetirizine (ZYRTEC) 10 MG tablet Take 10 mg by mouth daily.    [provider]  Cholecalciferol (VITAMIN D3) 125 MCG (5000 UT) CAPS Take by mouth daily.    [provider]  levothyroxine (SYNTHROID) 25 MCG tablet Take 1 tablet daily on an empty stomach with only water for 30 minutes & no Antacid meds, Calcium or Magnesium for 4 hours & avoid Biotin 05/22/20  Unk Pinto, MD  Multiple Vitamins-Minerals (ONE DAILY CALCIUM/IRON) TABS Take by mouth daily.    [provider]  rosuvastatin (CRESTOR) 10 MG tablet Take 1 tablet Daily for Cholesterol 05/22/20   Unk Pinto, MD     Positive ROS: Otherwise negative  All other systems have been reviewed and were otherwise negative with the exception of those mentioned in the HPI and as above.  Physical Exam: There were no vitals filed for this visit.  General: Alert, no acute distress Oral: Normal oral mucosa and tonsils Nasal: Clear nasal passages Neck: No palpable adenopathy or thyroid nodules Ear: Ear canal is clear.  Patient with a moderate size posterior superior right tympanic membrane perforation that extends to the margin.  Middle ear appears clear otherwise. Cardiovascular: Regular rate and  rhythm, no murmur.  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   Melony Overly, MD 05/31/2020 10:19 AM

## 2020-06-03 ENCOUNTER — Encounter (INDEPENDENT_AMBULATORY_CARE_PROVIDER_SITE_OTHER): Payer: Self-pay | Admitting: Otolaryngology

## 2020-06-04 ENCOUNTER — Other Ambulatory Visit (HOSPITAL_COMMUNITY)
Admission: RE | Admit: 2020-06-04 | Discharge: 2020-06-04 | Disposition: A | Discharge status: Home / Self Care | Payer: Medicare HMO | Source: Ambulatory Visit | Attending: Surgery | Admitting: Surgery

## 2020-06-04 DIAGNOSIS — Z20822 Contact with and (suspected) exposure to covid-19: Secondary | ICD-10-CM | POA: Diagnosis not present

## 2020-06-04 DIAGNOSIS — Z01812 Encounter for preprocedural laboratory examination: Secondary | ICD-10-CM | POA: Diagnosis not present

## 2020-06-04 LAB — SARS CORONAVIRUS 2 (TAT 6-24 HRS): SARS Coronavirus 2: NEGATIVE

## 2020-06-07 ENCOUNTER — Encounter (HOSPITAL_BASED_OUTPATIENT_CLINIC_OR_DEPARTMENT_OTHER): Payer: Self-pay | Admitting: Otolaryngology

## 2020-06-07 ENCOUNTER — Other Ambulatory Visit: Payer: Self-pay

## 2020-06-07 ENCOUNTER — Encounter (HOSPITAL_BASED_OUTPATIENT_CLINIC_OR_DEPARTMENT_OTHER)
Admission: RE | Disposition: A | Discharge status: Home / Self Care | Payer: Self-pay | Source: Ambulatory Visit | Attending: Otolaryngology

## 2020-06-07 ENCOUNTER — Ambulatory Visit (HOSPITAL_BASED_OUTPATIENT_CLINIC_OR_DEPARTMENT_OTHER)
Admission: RE | Admit: 2020-06-07 | Discharge: 2020-06-07 | Disposition: A | Discharge status: Home / Self Care | Payer: Medicare HMO | Source: Ambulatory Visit | Attending: Otolaryngology | Admitting: Otolaryngology

## 2020-06-07 ENCOUNTER — Ambulatory Visit (HOSPITAL_BASED_OUTPATIENT_CLINIC_OR_DEPARTMENT_OTHER): Payer: Medicare HMO | Admitting: Anesthesiology

## 2020-06-07 DIAGNOSIS — L821 Other seborrheic keratosis: Secondary | ICD-10-CM | POA: Insufficient documentation

## 2020-06-07 DIAGNOSIS — Z79899 Other long term (current) drug therapy: Secondary | ICD-10-CM | POA: Insufficient documentation

## 2020-06-07 DIAGNOSIS — E039 Hypothyroidism, unspecified: Secondary | ICD-10-CM | POA: Insufficient documentation

## 2020-06-07 DIAGNOSIS — H7291 Unspecified perforation of tympanic membrane, right ear: Secondary | ICD-10-CM | POA: Diagnosis not present

## 2020-06-07 DIAGNOSIS — Z79890 Hormone replacement therapy (postmenopausal): Secondary | ICD-10-CM | POA: Diagnosis not present

## 2020-06-07 DIAGNOSIS — E785 Hyperlipidemia, unspecified: Secondary | ICD-10-CM | POA: Insufficient documentation

## 2020-06-07 DIAGNOSIS — H722X1 Other marginal perforations of tympanic membrane, right ear: Secondary | ICD-10-CM

## 2020-06-07 DIAGNOSIS — I10 Essential (primary) hypertension: Secondary | ICD-10-CM | POA: Diagnosis not present

## 2020-06-07 DIAGNOSIS — H902 Conductive hearing loss, unspecified: Secondary | ICD-10-CM | POA: Diagnosis not present

## 2020-06-07 DIAGNOSIS — L820 Inflamed seborrheic keratosis: Secondary | ICD-10-CM | POA: Diagnosis not present

## 2020-06-07 HISTORY — DX: Nausea with vomiting, unspecified: R11.2

## 2020-06-07 HISTORY — DX: Hypothyroidism, unspecified: E03.9

## 2020-06-07 HISTORY — PX: TYMPANOPLASTY: SHX33

## 2020-06-07 HISTORY — DX: Other specified postprocedural states: Z98.890

## 2020-06-07 SURGERY — TYMPANOPLASTY
Anesthesia: General | Site: Ear | Laterality: Right

## 2020-06-07 MED ORDER — CHLORHEXIDINE GLUCONATE CLOTH 2 % EX PADS: 6.0000 | MEDICATED_PAD | Freq: Once | CUTANEOUS | Status: DC

## 2020-06-07 MED ORDER — ACETAMINOPHEN 500 MG PO TABS
1000.0000 mg | ORAL_TABLET | Freq: Once | ORAL | Status: AC
  Administered 2020-06-07: 1000 mg via ORAL

## 2020-06-07 MED ORDER — BUPIVACAINE HCL (PF) 0.25 % IJ SOLN
INTRAMUSCULAR | Status: AC
  Filled 2020-06-07: qty 30

## 2020-06-07 MED ORDER — FENTANYL CITRATE (PF) 100 MCG/2ML IJ SOLN: 25.0000 ug | INTRAMUSCULAR | Status: DC | PRN

## 2020-06-07 MED ORDER — DEXAMETHASONE SODIUM PHOSPHATE 4 MG/ML IJ SOLN
INTRAMUSCULAR | Status: DC | PRN
  Administered 2020-06-07: 10 mg via INTRAVENOUS

## 2020-06-07 MED ORDER — LIDOCAINE-EPINEPHRINE 1 %-1:100000 IJ SOLN
INTRAMUSCULAR | Status: DC | PRN
  Administered 2020-06-07: 3 mL

## 2020-06-07 MED ORDER — LACTATED RINGERS IV SOLN: INTRAVENOUS | Status: DC

## 2020-06-07 MED ORDER — MIDAZOLAM HCL 2 MG/2ML IJ SOLN
INTRAMUSCULAR | Status: AC
  Filled 2020-06-07: qty 2

## 2020-06-07 MED ORDER — EPINEPHRINE PF 1 MG/ML IJ SOLN
INTRAMUSCULAR | Status: DC | PRN
  Administered 2020-06-07: 1 mg

## 2020-06-07 MED ORDER — FENTANYL CITRATE (PF) 100 MCG/2ML IJ SOLN
INTRAMUSCULAR | Status: AC
  Filled 2020-06-07: qty 2

## 2020-06-07 MED ORDER — ROCURONIUM BROMIDE 10 MG/ML (PF) SYRINGE
PREFILLED_SYRINGE | INTRAVENOUS | Status: AC
  Filled 2020-06-07: qty 10

## 2020-06-07 MED ORDER — DIPHENHYDRAMINE HCL 50 MG/ML IJ SOLN
INTRAMUSCULAR | Status: DC | PRN
  Administered 2020-06-07: 6.25 mg via INTRAVENOUS

## 2020-06-07 MED ORDER — CIPROFLOXACIN-DEXAMETHASONE 0.3-0.1 % OT SUSP
OTIC | Status: AC
  Filled 2020-06-07: qty 7.5

## 2020-06-07 MED ORDER — CEFAZOLIN SODIUM-DEXTROSE 2-4 GM/100ML-% IV SOLN
2.0000 g | INTRAVENOUS | Status: AC
  Administered 2020-06-07: 2 g via INTRAVENOUS

## 2020-06-07 MED ORDER — LIDOCAINE 2% (20 MG/ML) 5 ML SYRINGE
INTRAMUSCULAR | Status: AC
  Filled 2020-06-07: qty 5

## 2020-06-07 MED ORDER — PROPOFOL 500 MG/50ML IV EMUL
INTRAVENOUS | Status: DC | PRN
Start: 2020-06-07 — End: 2020-06-07
  Administered 2020-06-07: 25 ug/kg/min via INTRAVENOUS

## 2020-06-07 MED ORDER — ACETAMINOPHEN 500 MG PO TABS
ORAL_TABLET | ORAL | Status: AC
  Filled 2020-06-07: qty 2

## 2020-06-07 MED ORDER — EPINEPHRINE PF 1 MG/ML IJ SOLN
INTRAMUSCULAR | Status: AC
  Filled 2020-06-07: qty 1

## 2020-06-07 MED ORDER — DIPHENHYDRAMINE HCL 50 MG/ML IJ SOLN
INTRAMUSCULAR | Status: AC
  Filled 2020-06-07: qty 1

## 2020-06-07 MED ORDER — FENTANYL CITRATE (PF) 100 MCG/2ML IJ SOLN
INTRAMUSCULAR | Status: DC | PRN
  Administered 2020-06-07 (×2): 50 ug via INTRAVENOUS

## 2020-06-07 MED ORDER — LIDOCAINE HCL (CARDIAC) PF 100 MG/5ML IV SOSY
PREFILLED_SYRINGE | INTRAVENOUS | Status: DC | PRN
  Administered 2020-06-07: 60 mg via INTRAVENOUS

## 2020-06-07 MED ORDER — BACITRACIN ZINC 500 UNIT/GM EX OINT
TOPICAL_OINTMENT | CUTANEOUS | Status: AC
  Filled 2020-06-07: qty 28.35

## 2020-06-07 MED ORDER — EPHEDRINE SULFATE 50 MG/ML IJ SOLN
INTRAMUSCULAR | Status: DC | PRN
  Administered 2020-06-07: 10 mg via INTRAVENOUS

## 2020-06-07 MED ORDER — PROPOFOL 10 MG/ML IV BOLUS
INTRAVENOUS | Status: DC | PRN
  Administered 2020-06-07: 150 mg via INTRAVENOUS

## 2020-06-07 MED ORDER — ONDANSETRON HCL 4 MG/2ML IJ SOLN
INTRAMUSCULAR | Status: DC | PRN
  Administered 2020-06-07: 4 mg via INTRAVENOUS

## 2020-06-07 MED ORDER — LIDOCAINE-EPINEPHRINE 1 %-1:100000 IJ SOLN
INTRAMUSCULAR | Status: AC
  Filled 2020-06-07: qty 1

## 2020-06-07 MED ORDER — BACITRACIN ZINC 500 UNIT/GM EX OINT
TOPICAL_OINTMENT | CUTANEOUS | Status: DC | PRN
  Administered 2020-06-07: 1 via TOPICAL

## 2020-06-07 MED ORDER — DEXAMETHASONE SODIUM PHOSPHATE 10 MG/ML IJ SOLN
INTRAMUSCULAR | Status: AC
  Filled 2020-06-07: qty 1

## 2020-06-07 MED ORDER — METHYLENE BLUE 0.5 % INJ SOLN
INTRAVENOUS | Status: DC | PRN
  Administered 2020-06-07: 1 mL

## 2020-06-07 MED ORDER — METHYLENE BLUE 0.5 % INJ SOLN
INTRAVENOUS | Status: AC
  Filled 2020-06-07: qty 10

## 2020-06-07 MED ORDER — EPHEDRINE 5 MG/ML INJ
INTRAVENOUS | Status: AC
  Filled 2020-06-07: qty 10

## 2020-06-07 MED ORDER — CIPROFLOXACIN-DEXAMETHASONE 0.3-0.1 % OT SUSP
OTIC | Status: DC | PRN
  Administered 2020-06-07: 4 [drp] via OTIC

## 2020-06-07 MED ORDER — SCOPOLAMINE 1 MG/3DAYS TD PT72
MEDICATED_PATCH | TRANSDERMAL | Status: AC
  Filled 2020-06-07: qty 1

## 2020-06-07 MED ORDER — CEFAZOLIN SODIUM-DEXTROSE 2-4 GM/100ML-% IV SOLN
INTRAVENOUS | Status: AC
  Filled 2020-06-07: qty 100

## 2020-06-07 MED ORDER — MIDAZOLAM HCL 5 MG/5ML IJ SOLN
INTRAMUSCULAR | Status: DC | PRN
  Administered 2020-06-07: 1 mg via INTRAVENOUS

## 2020-06-07 MED ORDER — PHENYLEPHRINE 40 MCG/ML (10ML) SYRINGE FOR IV PUSH (FOR BLOOD PRESSURE SUPPORT)
PREFILLED_SYRINGE | INTRAVENOUS | Status: AC
  Filled 2020-06-07: qty 10

## 2020-06-07 MED ORDER — ONDANSETRON HCL 4 MG/2ML IJ SOLN
INTRAMUSCULAR | Status: AC
  Filled 2020-06-07: qty 2

## 2020-06-07 MED ORDER — SCOPOLAMINE 1 MG/3DAYS TD PT72
MEDICATED_PATCH | TRANSDERMAL | Status: DC | PRN
  Administered 2020-06-07: 1 via TRANSDERMAL

## 2020-06-07 MED ORDER — CEPHALEXIN 500 MG PO CAPS: 500.0000 mg | ORAL_CAPSULE | Freq: Two times a day (BID) | ORAL | 0 refills | Status: DC

## 2020-06-07 MED ORDER — SUCCINYLCHOLINE CHLORIDE 200 MG/10ML IV SOSY
PREFILLED_SYRINGE | INTRAVENOUS | Status: AC
  Filled 2020-06-07: qty 10

## 2020-06-07 MED ORDER — HYDROCODONE-ACETAMINOPHEN 5-325 MG PO TABS: 1.0000 | ORAL_TABLET | Freq: Four times a day (QID) | ORAL | 0 refills | Status: DC | PRN

## 2020-06-07 SURGICAL SUPPLY — 60 items
BENZOIN TINCTURE PRP APPL 2/3 (GAUZE/BANDAGES/DRESSINGS) ×2 IMPLANT
BLADE CLIPPER SURG (BLADE) ×2 IMPLANT
BLADE EAR TYMPAN 2.5 60D BEAV (BLADE) ×2 IMPLANT
BLADE EYE SICKLE 84 5 BEAV (BLADE) IMPLANT
BLADE NEEDLE 3 SS STRL (BLADE) IMPLANT
CANISTER SUCT 1200ML W/VALVE (MISCELLANEOUS) ×2 IMPLANT
COTTONBALL LRG STERILE PKG (GAUZE/BANDAGES/DRESSINGS) ×2 IMPLANT
COVER WAND RF STERILE (DRAPES) IMPLANT
DECANTER SPIKE VIAL GLASS SM (MISCELLANEOUS) ×2 IMPLANT
DEPRESSOR TONGUE BLADE STERILE (MISCELLANEOUS) ×8 IMPLANT
DERMABOND ADVANCED (GAUZE/BANDAGES/DRESSINGS) ×1
DERMABOND ADVANCED .7 DNX12 (GAUZE/BANDAGES/DRESSINGS) ×1 IMPLANT
DRAPE EENT ADH APERT 31X51 STR (DRAPES) ×2 IMPLANT
DRAPE MICROSCOPE URBAN (DRAPES) ×2 IMPLANT
DRAPE SURG 17X23 STRL (DRAPES) IMPLANT
DRSG CURAD 3X16 NADH (PACKING) ×2 IMPLANT
DRSG GLASSCOCK MASTOID ADT (GAUZE/BANDAGES/DRESSINGS) ×2 IMPLANT
DRSG GLASSCOCK MASTOID PED (GAUZE/BANDAGES/DRESSINGS) IMPLANT
ELECT COATED BLADE 2.86 ST (ELECTRODE) ×2 IMPLANT
ELECT REM PT RETURN 9FT ADLT (ELECTROSURGICAL) ×2
ELECTRODE REM PT RTRN 9FT ADLT (ELECTROSURGICAL) ×1 IMPLANT
GAUZE SPONGE 4X4 12PLY STRL (GAUZE/BANDAGES/DRESSINGS) IMPLANT
GAUZE SPONGE 4X4 12PLY STRL LF (GAUZE/BANDAGES/DRESSINGS) ×2 IMPLANT
GLOVE BIOGEL PI IND STRL 6.5 (GLOVE) ×1 IMPLANT
GLOVE BIOGEL PI INDICATOR 6.5 (GLOVE) ×1
GLOVE ECLIPSE 6.5 STRL STRAW (GLOVE) ×2 IMPLANT
GLOVE SS BIOGEL STRL SZ 7.5 (GLOVE) ×1 IMPLANT
GLOVE SUPERSENSE BIOGEL SZ 7.5 (GLOVE) ×1
GOWN STRL REUS W/ TWL LRG LVL3 (GOWN DISPOSABLE) ×1 IMPLANT
GOWN STRL REUS W/ TWL XL LVL3 (GOWN DISPOSABLE) ×1 IMPLANT
GOWN STRL REUS W/TWL LRG LVL3 (GOWN DISPOSABLE) ×1
GOWN STRL REUS W/TWL XL LVL3 (GOWN DISPOSABLE) ×1
IV CATH AUTO 14GX1.75 SAFE ORG (IV SOLUTION) IMPLANT
IV NS 500ML (IV SOLUTION)
IV NS 500ML BAXH (IV SOLUTION) IMPLANT
IV SET EXT 30 76VOL 4 MALE LL (IV SETS) ×2 IMPLANT
NDL SAFETY ECLIPSE 18X1.5 (NEEDLE) IMPLANT
NEEDLE FILTER BLUNT 18X 1/2SAF (NEEDLE) ×1
NEEDLE FILTER BLUNT 18X1 1/2 (NEEDLE) ×1 IMPLANT
NEEDLE HYPO 18GX1.5 SHARP (NEEDLE)
NEEDLE PRECISIONGLIDE 27X1.5 (NEEDLE) ×2 IMPLANT
NS IRRIG 1000ML POUR BTL (IV SOLUTION) ×2 IMPLANT
PACK BASIN DAY SURGERY FS (CUSTOM PROCEDURE TRAY) ×2 IMPLANT
PACK ENT DAY SURGERY (CUSTOM PROCEDURE TRAY) ×2 IMPLANT
PENCIL SMOKE EVACUATOR (MISCELLANEOUS) ×2 IMPLANT
SHEET MEDIUM DRAPE 40X70 STRL (DRAPES) IMPLANT
SLEEVE SCD COMPRESS KNEE MED (MISCELLANEOUS) ×2 IMPLANT
SPONGE SURGIFOAM ABS GEL 12-7 (HEMOSTASIS) ×6 IMPLANT
STRIP CLOSURE SKIN 1/2X4 (GAUZE/BANDAGES/DRESSINGS) IMPLANT
SUT CHROMIC 2 0 SH (SUTURE) IMPLANT
SUT CHROMIC 3 0 PS 2 (SUTURE) ×2 IMPLANT
SUT ETHILON 4 0 P 3 18 (SUTURE) IMPLANT
SUT ETHILON 5 0 P 3 18 (SUTURE) ×1
SUT NYLON ETHILON 5-0 P-3 1X18 (SUTURE) ×1 IMPLANT
SUT PLAIN 5 0 P 3 18 (SUTURE) IMPLANT
SYR 3ML 18GX1 1/2 (SYRINGE) IMPLANT
SYR TB 1ML LL NO SAFETY (SYRINGE) IMPLANT
TOWEL GREEN STERILE FF (TOWEL DISPOSABLE) ×4 IMPLANT
TRAY DSU PREP LF (CUSTOM PROCEDURE TRAY) ×2 IMPLANT
TUBING IRRIGATION (MISCELLANEOUS) IMPLANT

## 2020-06-07 NOTE — Op Note (Signed)
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    POSTOPERATIVE DIAGNOSES:   1.  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 2.  Right postauricular skin lesion.  OPERATION PERFORMED:  Right medial graft tympanoplasty.  Excisional biopsy of right postauricular skin lesion.  SURGEON:  Melony Overly, MD  ANESTHESIA:  General LMA.  ESTIMATED BLOOD LOSS:  Minimal.  COMPLICATIONS:  None.  BRIEF CLINICAL NOTE:  The patient is a 64 year old female who has a large posterior TM perforation that approaches the margin posteriorly, superiorly.  She has had previous tympanoplasty performed elsewhere about 4 years ago that was unsuccessful.   Middle ear space has been dry without any active drainage.  She is taken to the operating room at this time for repeat tympanoplasty.  On hearing tests she has a moderate low frequency conductive hearing loss.  Of note, she also has a small skin lesion  behind the right pinna in the area of the region of graft harvest.  DESCRIPTION OF PROCEDURE:  Patient's ear was prepped with Betadine solution and draped in sterile towels.  Then, the ear canal was examined and the eardrum was examined.  The patient had a posterior almost 50% TM perforation.  There was no active  drainage noted.  No evidence of cholesteatoma grossly.  The edges of the perforation were freshened up with a pick and cup forceps.  Then, a posterior based tympanomeatal flap was elevated down to the annulus.  Cotton pledgets soaked in adrenaline were  placed for hemostasis and while these were obtaining hemostasis, the postauricular incision was made to harvest a fascia graft.  Note, the patient  had a skin lesion within the hairline that was elliptically excised and sent as a separate specimen  postauricular skin lesion.  The fascia graft was harvested through this incision and set aside to dry.  The postauricular incision was closed with 3-0 chromic suture subcutaneously and 5-0 nylon to reapproximate the skin edges.  Then, attention was  carried back to the ear canal and the annulus was elevated.  The chorda tympani was identified superiorly and was preserved.  There were some adhesions between the malleus, long handle, and the promontory as well as some adhesions around the incus and  stapes.  There was some thickening of this bone, but they were mobile.  Adhesions were removed.  The annulus was elevated down inferiorly.  The fascia graft was cut to appropriate size and placed medial to the tympanomeatal flap.  The flap was brought  back down.  The graft covered the entire perforation, but its limits extended up superiorly as well as inferior as the inferior perforation extending almost to the annulus.  Middle ear was packed with Gelfoam soaked in Ciprodex.  The tympanomeatal flap  and fascia graft was brought back down.  Ear canal was then packed with Gelfoam soaked in Ciprodex.  The graft covered the entire perforation.  After packing the ear canal with Gelfoam soaked in Ciprodex Glasscock mastoid dressing was applied.   Antibiotic ointment was applied to the incision behind the ear where the graft was harvested.  A Glasscock dressing was applied.  The patient was awoken from anesthesia and transferred to recovery room postoperatively doing well.  DISPOSITION:  She is discharged home later this morning on  Keflex 500 mg b.i.d. for [redacted] week along with Tylenol, ibuprofen, and/or hydrocodone 5 mg tablets p.r.n. pain.  She will follow up in my office in 1 week to have the postauricular incision sutures  removed.  CN/NUANCE  D:06/07/2020 T:06/07/2020 JOB:[id number]

## 2020-06-07 NOTE — Discharge Instructions (Addendum)
Remove ear dressing in AM and change cotton ball in ear. Keep water out of ear but OK to wash behind the ear in 24 hrs. Take your regular meds Also Keflex 500 mg bid for the next week Tylenol, ibuprofen or Hydrocodone tabs 1-2 every 6 hrs prn pain. No Tylenol until 12:54 pm.  Call Dr Pollie Friar office for follow up in 1 week    [phone]    Post Anesthesia Home Care Instructions  Activity: Get plenty of rest for the remainder of the day. A responsible individual must stay with you for 24 hours following the procedure.  For the next 24 hours, DO NOT: -Drive a car -Paediatric nurse -Drink alcoholic beverages -Take any medication unless instructed by your physician -Make any legal decisions or sign important papers.  Meals: Start with liquid foods such as gelatin or soup. Progress to regular foods as tolerated. Avoid greasy, spicy, heavy foods. If nausea and/or vomiting occur, drink only clear liquids until the nausea and/or vomiting subsides. Call your physician if vomiting continues.  Special Instructions/Symptoms: Your throat may feel dry or sore from the anesthesia or the breathing tube placed in your throat during surgery. If this causes discomfort, gargle with warm salt water. The discomfort should disappear within 24 hours.  If you had a scopolamine patch placed behind your ear for the management of post- operative nausea and/or vomiting:  1. The medication in the patch is effective for 72 hours, after which it should be removed.  Wrap patch in a tissue and discard in the trash. Wash hands thoroughly with soap and water. 2. You may remove the patch earlier than 72 hours if you experience unpleasant side effects which may include dry mouth, dizziness or visual disturbances. 3. Avoid touching the patch. Wash your hands with soap and water after contact with the patch.     Post Anesthesia Home Care Instructions  Activity: Get plenty of rest for the remainder of the day. A  responsible individual must stay with you for 24 hours following the procedure.  For the next 24 hours, DO NOT: -Drive a car -Paediatric nurse -Drink alcoholic beverages -Take any medication unless instructed by your physician -Make any legal decisions or sign important papers.  Meals: Start with liquid foods such as gelatin or soup. Progress to regular foods as tolerated. Avoid greasy, spicy, heavy foods. If nausea and/or vomiting occur, drink only clear liquids until the nausea and/or vomiting subsides. Call your physician if vomiting continues.  Special Instructions/Symptoms: Your throat may feel dry or sore from the anesthesia or the breathing tube placed in your throat during surgery. If this causes discomfort, gargle with warm salt water. The discomfort should disappear within 24 hours.  If you had a scopolamine patch placed behind your ear for the management of post- operative nausea and/or vomiting:  1. The medication in the patch is effective for 72 hours, after which it should be removed.  Wrap patch in a tissue and discard in the trash. Wash hands thoroughly with soap and water. 2. You may remove the patch earlier than 72 hours if you experience unpleasant side effects which may include dry mouth, dizziness or visual disturbances. 3. Avoid touching the patch. Wash your hands with soap and water after contact with the patch.

## 2020-06-07 NOTE — Brief Op Note (Signed)
06/07/2020  10:31 AM  PATIENT:  Tracy Brady  64 y.o. female  PRE-OPERATIVE DIAGNOSIS:  RIGHT TYMPANIC PERFORATION  POST-OPERATIVE DIAGNOSIS:  RIGHT TYMPANIC PERFORATION  PROCEDURE:  Procedure(s): TYMPANOPLASTY (Right)  SURGEON:  Surgeon(s) and Role:    Rozetta Nunnery, MD - Primary  PHYSICIAN ASSISTANT:   ASSISTANTS: none   ANESTHESIA:   general  EBL:  5 mL   BLOOD ADMINISTERED:none  DRAINS: none   LOCAL MEDICATIONS USED:  XYLOCAINE   SPECIMEN:  Source of Specimen:  right post auricular skin lesion  DISPOSITION OF SPECIMEN:  PATHOLOGY  COUNTS:  YES  TOURNIQUET:  * No tourniquets in log *  DICTATION: .Other Dictation: Dictation Number [id number]  PLAN OF CARE: Discharge to home after PACU  PATIENT DISPOSITION:  PACU - hemodynamically stable.   Delay start of Pharmacological VTE agent (>24hrs) due to surgical blood loss or risk of bleeding: yes

## 2020-06-07 NOTE — Interval H&P Note (Signed)
History and Physical Interval Note:  06/07/2020 7:32 AM  Tracy Brady  has presented today for surgery, with the diagnosis of RIGHT TYMPANIC PERFORATION.  The various methods of treatment have been discussed with the patient and family. After consideration of risks, benefits and other options for treatment, the patient has consented to  Procedure(s): TYMPANOPLASTY (Right) as a surgical intervention.  The patient's history has been reviewed, patient examined, no change in status, stable for surgery.  I have reviewed the patient's chart and labs.  Questions were answered to the patient's satisfaction.     Melony Overly

## 2020-06-07 NOTE — Anesthesia Postprocedure Evaluation (Signed)
Anesthesia Post Note  Patient: Tracy Brady  Procedure(s) Performed: TYMPANOPLASTY (Right Ear)     Patient location during evaluation: PACU Anesthesia Type: General Level of consciousness: awake and alert Pain management: pain level controlled Vital Signs Assessment: post-procedure vital signs reviewed and stable Respiratory status: spontaneous breathing, nonlabored ventilation, respiratory function stable and patient connected to nasal cannula oxygen Cardiovascular status: blood pressure returned to baseline and stable Postop Assessment: no apparent nausea or vomiting Anesthetic complications: no   No complications documented.  Last Vitals:  Vitals:   06/07/20 1115 06/07/20 1158  BP: (!) 147/82 139/75  Pulse: (!) 106 97  Resp: 20 18  Temp:  36.6 C  SpO2: 98% 99%    Last Pain:  Vitals:   06/07/20 1158  TempSrc:   PainSc: 0-No pain                 Dickie Cloe L Deloss Amico

## 2020-06-07 NOTE — Anesthesia Preprocedure Evaluation (Addendum)
Anesthesia Evaluation  Patient identified by MRN, date of birth, ID band Patient awake    Reviewed: Allergy & Precautions, NPO status , Patient's Chart, lab work & pertinent test results  History of Anesthesia Complications (+) PONV  Airway Mallampati: I  TM Distance: >3 FB Neck ROM: Full    Dental no notable dental hx. (+) Teeth Intact, Dental Advisory Given   Pulmonary neg pulmonary ROS,    Pulmonary exam normal breath sounds clear to auscultation       Cardiovascular hypertension, Normal cardiovascular exam Rhythm:Regular Rate:Normal  HLD   Neuro/Psych negative neurological ROS  negative psych ROS   GI/Hepatic negative GI ROS, Neg liver ROS,   Endo/Other  Hypothyroidism   Renal/GU negative Renal ROS  negative genitourinary   Musculoskeletal negative musculoskeletal ROS (+)   Abdominal   Peds  Hematology negative hematology ROS (+)   Anesthesia Other Findings   Reproductive/Obstetrics                            Anesthesia Physical Anesthesia Plan  ASA: II  Anesthesia Plan: General   Post-op Pain Management:    Induction: Intravenous  PONV Risk Score and Plan: 4 or greater and Ondansetron, Dexamethasone, Midazolam and Scopolamine patch - Pre-op  Airway Management Planned: LMA  Additional Equipment:   Intra-op Plan:   Post-operative Plan: Extubation in OR  Informed Consent: I have reviewed the patients History and Physical, chart, labs and discussed the procedure including the risks, benefits and alternatives for the proposed anesthesia with the patient or authorized representative who has indicated his/her understanding and acceptance.     Dental advisory given  Plan Discussed with: CRNA  Anesthesia Plan Comments:         Anesthesia Quick Evaluation

## 2020-06-07 NOTE — Anesthesia Procedure Notes (Signed)
Procedure Name: LMA Insertion Date/Time: 06/07/2020 7:44 AM Performed by: Willa Frater, CRNA Pre-anesthesia Checklist: Patient identified, Emergency Drugs available, Suction available and Patient being monitored Patient Re-evaluated:Patient Re-evaluated prior to induction Oxygen Delivery Method: Circle system utilized Preoxygenation: Pre-oxygenation with 100% oxygen Induction Type: IV induction Ventilation: Mask ventilation without difficulty LMA: LMA inserted LMA Size: 3.0 Number of attempts: 1 Airway Equipment and Method: Bite block Placement Confirmation: positive ETCO2 Tube secured with: Tape Dental Injury: Teeth and Oropharynx as per pre-operative assessment

## 2020-06-07 NOTE — Transfer of Care (Signed)
Immediate Anesthesia Transfer of Care Note  Patient: Tracy Brady  Procedure(s) Performed: TYMPANOPLASTY (Right Ear)  Patient Location: PACU  Anesthesia Type:General  Level of Consciousness: sedated  Airway & Oxygen Therapy: Patient Spontanous Breathing and Patient connected to face mask oxygen  Post-op Assessment: Report given to RN and Post -op Vital signs reviewed and stable  Post vital signs: Reviewed and stable  Last Vitals:  Vitals Value Taken Time  BP 143/79 06/07/20 1036  Temp    Pulse 109 06/07/20 1037  Resp    SpO2 99 % 06/07/20 1037  Vitals shown include unvalidated device data.  Last Pain:  Vitals:   06/07/20 0648  TempSrc: Oral  PainSc: 0-No pain      Patients Stated Pain Goal: 3 (78/93/81 0175)  Complications: No complications documented.

## 2020-06-10 ENCOUNTER — Encounter (HOSPITAL_BASED_OUTPATIENT_CLINIC_OR_DEPARTMENT_OTHER): Payer: Self-pay | Admitting: Otolaryngology

## 2020-06-10 LAB — SURGICAL PATHOLOGY

## 2020-06-13 ENCOUNTER — Other Ambulatory Visit: Payer: Self-pay

## 2020-06-13 ENCOUNTER — Encounter (INDEPENDENT_AMBULATORY_CARE_PROVIDER_SITE_OTHER): Payer: Self-pay | Admitting: Otolaryngology

## 2020-06-13 ENCOUNTER — Ambulatory Visit (INDEPENDENT_AMBULATORY_CARE_PROVIDER_SITE_OTHER): Payer: Medicare HMO | Admitting: Otolaryngology

## 2020-06-13 VITALS — Temp 97.3°F

## 2020-06-13 DIAGNOSIS — Z4889 Encounter for other specified surgical aftercare: Secondary | ICD-10-CM

## 2020-06-13 NOTE — Progress Notes (Signed)
HPI: Tracy Brady is a 64 y.o. female who presents 6 days s/p right tympanoplasty.  She is doing well.  She also had a lesion removed from the right postauricular area that showed benign hyperplasia epithelium and some benign changes.  She is doing well with no drainage from the ear but has decreased hearing in the right ear..   Past Medical History:  Diagnosis Date  . Allergic rhinitis   . Basal cell carcinoma   . Hyperlipidemia   . Hypothyroidism   . PONV (postoperative nausea and vomiting)   . Thyroid disease   . Vitamin D deficiency    Past Surgical History:  Procedure Laterality Date  . TYMPANOPLASTY Left   . TYMPANOPLASTY Right 06/07/2020   Procedure: TYMPANOPLASTY;  Surgeon: Rozetta Nunnery, MD;  Location: [place];  Service: ENT;  Laterality: Right;   Social History   Socioeconomic History  . Marital status: Married    Spouse name: Not on file  . Number of children: Not on file  . Years of education: Not on file  . Highest education level: Not on file  Occupational History  . Not on file  Tobacco Use  . Smoking status: Never Smoker  . Smokeless tobacco: Never Used  Vaping Use  . Vaping Use: Never used  Substance and Sexual Activity  . Alcohol use: No  . Drug use: No  . Sexual activity: Not on file  Other Topics Concern  . Not on file  Social History Narrative  . Not on file   Social Determinants of Health   Financial Resource Strain:   . Difficulty of Paying Living Expenses:   Food Insecurity:   . Worried About Charity fundraiser in the Last Year:   . Arboriculturist in the Last Year:   Transportation Needs:   . Film/video editor (Medical):   Marland Kitchen Lack of Transportation (Non-Medical):   Physical Activity:   . Days of Exercise per Week:   . Minutes of Exercise per Session:   Stress:   . Feeling of Stress :   Social Connections:   . Frequency of Communication with Friends and Family:   . Frequency of Social Gatherings with  Friends and Family:   . Attends Religious Services:   . Active Member of Clubs or Organizations:   . Attends Archivist Meetings:   Marland Kitchen Marital Status:    Family History  Problem Relation Age of Onset  . Hypertension Mother   . Heart disease Mother   . Hyperlipidemia Mother   . Heart attack Mother   . Cancer Maternal Uncle        Melanoma, had eye removed  . Varicose Veins Sister   . Cancer Sister 38       breast cancer   No Known Allergies Prior to Admission medications   Medication Sig Start Date End Date Taking? Authorizing Provider  cephALEXin (KEFLEX) 500 MG capsule Take 1 capsule (500 mg total) by mouth 2 (two) times daily. 06/07/20  Yes Rozetta Nunnery, MD  cetirizine (ZYRTEC) 10 MG tablet Take 10 mg by mouth daily.   Yes [provider]  Cholecalciferol (VITAMIN D3) 125 MCG (5000 UT) CAPS Take by mouth daily.   Yes [provider]  HYDROcodone-acetaminophen (NORCO/VICODIN) 5-325 MG tablet Take 1-2 tablets by mouth every 6 (six) hours as needed for moderate pain. 06/07/20  Yes Rozetta Nunnery, MD  levothyroxine (SYNTHROID) 25 MCG tablet Take 1 tablet  daily on an empty stomach with only water for 30 minutes & no Antacid meds, Calcium or Magnesium for 4 hours & avoid Biotin 05/22/20  Yes Unk Pinto, MD  Multiple Vitamins-Minerals (ONE DAILY CALCIUM/IRON) TABS Take by mouth daily.   Yes [provider]  rosuvastatin (CRESTOR) 10 MG tablet Take 1 tablet Daily for Cholesterol 05/22/20  Yes Unk Pinto, MD     Physical Exam: Packing is intact and dry with no drainage noted.  Postauricular sutures were removed in the office in postauricular incision site is healing nicely.   Assessment: S/p right tympanoplasty  Plan: She will follow-up in 2 and half weeks for recheck and cleaning the ear canal.   Radene Journey, MD

## 2020-06-17 DIAGNOSIS — D485 Neoplasm of uncertain behavior of skin: Secondary | ICD-10-CM | POA: Diagnosis not present

## 2020-06-17 DIAGNOSIS — H722X1 Other marginal perforations of tympanic membrane, right ear: Secondary | ICD-10-CM | POA: Diagnosis not present

## 2020-07-01 ENCOUNTER — Other Ambulatory Visit: Payer: Self-pay

## 2020-07-01 ENCOUNTER — Ambulatory Visit (INDEPENDENT_AMBULATORY_CARE_PROVIDER_SITE_OTHER): Payer: Medicare HMO | Admitting: Otolaryngology

## 2020-07-01 DIAGNOSIS — Z4889 Encounter for other specified surgical aftercare: Secondary | ICD-10-CM

## 2020-07-01 NOTE — Progress Notes (Signed)
HPI: Tracy Brady is a 64 y.o. female who presents 22 days s/p right tympanoplasty.  She is doing well with no drainage from the ear..   Past Medical History:  Diagnosis Date  . Allergic rhinitis   . Basal cell carcinoma   . Hyperlipidemia   . Hypothyroidism   . PONV (postoperative nausea and vomiting)   . Thyroid disease   . Vitamin D deficiency    Past Surgical History:  Procedure Laterality Date  . TYMPANOPLASTY Left   . TYMPANOPLASTY Right 06/07/2020   Procedure: TYMPANOPLASTY;  Surgeon: Rozetta Nunnery, MD;  Location: [place];  Service: ENT;  Laterality: Right;   Social History   Socioeconomic History  . Marital status: Married    Spouse name: Not on file  . Number of children: Not on file  . Years of education: Not on file  . Highest education level: Not on file  Occupational History  . Not on file  Tobacco Use  . Smoking status: Never Smoker  . Smokeless tobacco: Never Used  Vaping Use  . Vaping Use: Never used  Substance and Sexual Activity  . Alcohol use: No  . Drug use: No  . Sexual activity: Not on file  Other Topics Concern  . Not on file  Social History Narrative  . Not on file   Social Determinants of Health   Financial Resource Strain:   . Difficulty of Paying Living Expenses:   Food Insecurity:   . Worried About Charity fundraiser in the Last Year:   . Arboriculturist in the Last Year:   Transportation Needs:   . Film/video editor (Medical):   Marland Kitchen Lack of Transportation (Non-Medical):   Physical Activity:   . Days of Exercise per Week:   . Minutes of Exercise per Session:   Stress:   . Feeling of Stress :   Social Connections:   . Frequency of Communication with Friends and Family:   . Frequency of Social Gatherings with Friends and Family:   . Attends Religious Services:   . Active Member of Clubs or Organizations:   . Attends Archivist Meetings:   Marland Kitchen Marital Status:    Family History  Problem  Relation Age of Onset  . Hypertension Mother   . Heart disease Mother   . Hyperlipidemia Mother   . Heart attack Mother   . Cancer Maternal Uncle        Melanoma, had eye removed  . Varicose Veins Sister   . Cancer Sister 10       breast cancer   No Known Allergies Prior to Admission medications   Medication Sig Start Date End Date Taking? Authorizing Provider  cephALEXin (KEFLEX) 500 MG capsule Take 1 capsule (500 mg total) by mouth 2 (two) times daily. 06/07/20   Rozetta Nunnery, MD  cetirizine (ZYRTEC) 10 MG tablet Take 10 mg by mouth daily.    [provider]  Cholecalciferol (VITAMIN D3) 125 MCG (5000 UT) CAPS Take by mouth daily.    [provider]  HYDROcodone-acetaminophen (NORCO/VICODIN) 5-325 MG tablet Take 1-2 tablets by mouth every 6 (six) hours as needed for moderate pain. 06/07/20   Rozetta Nunnery, MD  levothyroxine (SYNTHROID) 25 MCG tablet Take 1 tablet daily on an empty stomach with only water for 30 minutes & no Antacid meds, Calcium or Magnesium for 4 hours & avoid Biotin 05/22/20   Unk Pinto, MD  Multiple Vitamins-Minerals (ONE  DAILY CALCIUM/IRON) TABS Take by mouth daily.    [provider]  rosuvastatin (CRESTOR) 10 MG tablet Take 1 tablet Daily for Cholesterol 05/22/20   Unk Pinto, MD     Physical Exam: Packing was removed from the ear canal today.  The graft appears to have 100% take.   Assessment: S/p right tympanoplasty  Plan: She is okay to get water in the ear. She will follow-up in 1 month for recheck and audiologic testing.   Radene Journey, MD

## 2020-07-09 ENCOUNTER — Ambulatory Visit: Payer: Medicare HMO | Admitting: Adult Health Nurse Practitioner

## 2020-08-06 ENCOUNTER — Other Ambulatory Visit: Payer: Self-pay

## 2020-08-06 ENCOUNTER — Ambulatory Visit (INDEPENDENT_AMBULATORY_CARE_PROVIDER_SITE_OTHER): Payer: Medicare HMO | Admitting: Otolaryngology

## 2020-08-06 VITALS — Temp 97.7°F

## 2020-08-06 DIAGNOSIS — H906 Mixed conductive and sensorineural hearing loss, bilateral: Secondary | ICD-10-CM | POA: Diagnosis not present

## 2020-08-06 DIAGNOSIS — Z4889 Encounter for other specified surgical aftercare: Secondary | ICD-10-CM

## 2020-08-06 NOTE — Progress Notes (Signed)
HPI: Tracy Brady is a 64 y.o. female who presents 2 months  s/p right tympanoplasty.  She has been doing well.  She has had no problems with water getting her ears when she showers.  Audiogram today demonstrated essentially symmetric hearing in both ears with improvement of her hearing from 40 dB preop to 20 dB postop.Marland Kitchen   Past Medical History:  Diagnosis Date  . Allergic rhinitis   . Basal cell carcinoma   . Hyperlipidemia   . Hypothyroidism   . PONV (postoperative nausea and vomiting)   . Thyroid disease   . Vitamin D deficiency    Past Surgical History:  Procedure Laterality Date  . TYMPANOPLASTY Left   . TYMPANOPLASTY Right 06/07/2020   Procedure: TYMPANOPLASTY;  Surgeon: Rozetta Nunnery, MD;  Location: [place];  Service: ENT;  Laterality: Right;   Social History   Socioeconomic History  . Marital status: Married    Spouse name: Not on file  . Number of children: Not on file  . Years of education: Not on file  . Highest education level: Not on file  Occupational History  . Not on file  Tobacco Use  . Smoking status: Never Smoker  . Smokeless tobacco: Never Used  Vaping Use  . Vaping Use: Never used  Substance and Sexual Activity  . Alcohol use: No  . Drug use: No  . Sexual activity: Not on file  Other Topics Concern  . Not on file  Social History Narrative  . Not on file   Social Determinants of Health   Financial Resource Strain:   . Difficulty of Paying Living Expenses: Not on file  Food Insecurity:   . Worried About Charity fundraiser in the Last Year: Not on file  . Ran Out of Food in the Last Year: Not on file  Transportation Needs:   . Lack of Transportation (Medical): Not on file  . Lack of Transportation (Non-Medical): Not on file  Physical Activity:   . Days of Exercise per Week: Not on file  . Minutes of Exercise per Session: Not on file  Stress:   . Feeling of Stress : Not on file  Social Connections:   . Frequency of  Communication with Friends and Family: Not on file  . Frequency of Social Gatherings with Friends and Family: Not on file  . Attends Religious Services: Not on file  . Active Member of Clubs or Organizations: Not on file  . Attends Archivist Meetings: Not on file  . Marital Status: Not on file   Family History  Problem Relation Age of Onset  . Hypertension Mother   . Heart disease Mother   . Hyperlipidemia Mother   . Heart attack Mother   . Cancer Maternal Uncle        Melanoma, had eye removed  . Varicose Veins Sister   . Cancer Sister 36       breast cancer   No Known Allergies Prior to Admission medications   Medication Sig Start Date End Date Taking? Authorizing Provider  cephALEXin (KEFLEX) 500 MG capsule Take 1 capsule (500 mg total) by mouth 2 (two) times daily. 06/07/20  Yes Rozetta Nunnery, MD  cetirizine (ZYRTEC) 10 MG tablet Take 10 mg by mouth daily.   Yes [provider]  Cholecalciferol (VITAMIN D3) 125 MCG (5000 UT) CAPS Take by mouth daily.   Yes [provider]  HYDROcodone-acetaminophen (NORCO/VICODIN) 5-325 MG tablet Take 1-2 tablets  by mouth every 6 (six) hours as needed for moderate pain. 06/07/20  Yes Rozetta Nunnery, MD  levothyroxine (SYNTHROID) 25 MCG tablet Take 1 tablet daily on an empty stomach with only water for 30 minutes & no Antacid meds, Calcium or Magnesium for 4 hours & avoid Biotin 05/22/20  Yes Unk Pinto, MD  Multiple Vitamins-Minerals (ONE DAILY CALCIUM/IRON) TABS Take by mouth daily.   Yes [provider]  rosuvastatin (CRESTOR) 10 MG tablet Take 1 tablet Daily for Cholesterol 05/22/20  Yes Unk Pinto, MD     Physical Exam: On microscopic exam of the right ear she has a very small 1 mm posterior superior perforation which is dry.  Graft otherwise has 95% take.   Assessment: S/p right tympanoplasty with very small residual perforation  Plan: Discussed with her concerning not  flushing the right ear canal out with water. She will follow-up in 7 to 8 months for recheck to see if the small residual perforation heals spontaneously. She will return earlier if she has any problems with the ear   Radene Journey, MD

## 2020-08-08 ENCOUNTER — Encounter (INDEPENDENT_AMBULATORY_CARE_PROVIDER_SITE_OTHER): Payer: Self-pay

## 2020-08-21 ENCOUNTER — Other Ambulatory Visit: Payer: Self-pay | Admitting: Internal Medicine

## 2020-08-21 DIAGNOSIS — Z1231 Encounter for screening mammogram for malignant neoplasm of breast: Secondary | ICD-10-CM | POA: Diagnosis not present

## 2020-08-21 DIAGNOSIS — Z780 Asymptomatic menopausal state: Secondary | ICD-10-CM | POA: Diagnosis not present

## 2020-08-21 DIAGNOSIS — M85852 Other specified disorders of bone density and structure, left thigh: Secondary | ICD-10-CM | POA: Diagnosis not present

## 2020-08-21 LAB — HM MAMMOGRAPHY

## 2020-08-21 LAB — HM DEXA SCAN

## 2020-08-22 ENCOUNTER — Other Ambulatory Visit: Payer: Self-pay | Admitting: Internal Medicine

## 2020-09-11 DIAGNOSIS — R69 Illness, unspecified: Secondary | ICD-10-CM | POA: Diagnosis not present

## 2020-10-01 ENCOUNTER — Encounter: Payer: Medicare HMO | Admitting: Physician Assistant

## 2020-10-03 ENCOUNTER — Encounter: Payer: Self-pay | Admitting: Adult Health Nurse Practitioner

## 2020-10-03 ENCOUNTER — Other Ambulatory Visit: Payer: Self-pay

## 2020-10-03 ENCOUNTER — Ambulatory Visit (INDEPENDENT_AMBULATORY_CARE_PROVIDER_SITE_OTHER): Payer: Medicare HMO | Admitting: Adult Health Nurse Practitioner

## 2020-10-03 VITALS — Ht 62.5 in | Wt 146.0 lb

## 2020-10-03 DIAGNOSIS — Z0000 Encounter for general adult medical examination without abnormal findings: Secondary | ICD-10-CM | POA: Diagnosis not present

## 2020-10-03 DIAGNOSIS — E038 Other specified hypothyroidism: Secondary | ICD-10-CM

## 2020-10-03 DIAGNOSIS — N1831 Chronic kidney disease, stage 3a: Secondary | ICD-10-CM

## 2020-10-03 DIAGNOSIS — E559 Vitamin D deficiency, unspecified: Secondary | ICD-10-CM | POA: Diagnosis not present

## 2020-10-03 DIAGNOSIS — Z136 Encounter for screening for cardiovascular disorders: Secondary | ICD-10-CM

## 2020-10-03 DIAGNOSIS — R600 Localized edema: Secondary | ICD-10-CM

## 2020-10-03 DIAGNOSIS — E538 Deficiency of other specified B group vitamins: Secondary | ICD-10-CM

## 2020-10-03 DIAGNOSIS — R7309 Other abnormal glucose: Secondary | ICD-10-CM

## 2020-10-03 DIAGNOSIS — Z23 Encounter for immunization: Secondary | ICD-10-CM

## 2020-10-03 DIAGNOSIS — I10 Essential (primary) hypertension: Secondary | ICD-10-CM | POA: Diagnosis not present

## 2020-10-03 DIAGNOSIS — E063 Autoimmune thyroiditis: Secondary | ICD-10-CM

## 2020-10-03 DIAGNOSIS — Z6825 Body mass index (BMI) 25.0-25.9, adult: Secondary | ICD-10-CM

## 2020-10-03 DIAGNOSIS — J309 Allergic rhinitis, unspecified: Secondary | ICD-10-CM

## 2020-10-03 DIAGNOSIS — Z1389 Encounter for screening for other disorder: Secondary | ICD-10-CM | POA: Diagnosis not present

## 2020-10-03 DIAGNOSIS — E785 Hyperlipidemia, unspecified: Secondary | ICD-10-CM

## 2020-10-03 DIAGNOSIS — M85852 Other specified disorders of bone density and structure, left thigh: Secondary | ICD-10-CM

## 2020-10-03 DIAGNOSIS — M8580 Other specified disorders of bone density and structure, unspecified site: Secondary | ICD-10-CM

## 2020-10-03 DIAGNOSIS — Z0001 Encounter for general adult medical examination with abnormal findings: Secondary | ICD-10-CM

## 2020-10-03 NOTE — Patient Instructions (Addendum)
We will contact you via My Chart with your lab results.  You received your influenza vaccination today 10/03/20.   You are due for colonoscopy.  Contact [facility], Dr Henrene Pastor. Please let us know if you need a referral for this.   Try wearing compression stockings, knee high.  Put them on in the morning and remove at night.  You want a medium compression.  You can order these online or go to [employer].  COMPRESSION STOCKINGS  HOME CARE INSTRUCTIONS   Do not stand or sit in one position for long periods of time. Do not sit with your legs crossed. Rest with your legs raised during the day.  Your legs have to be higher than your heart so that gravity will force the valves to open, so please really elevate your legs.   Wear elastic stockings or support hose. Do not wear other tight, encircling garments around the legs, pelvis, or waist.  [employer]  has a wide variety of well priced compression stockings. [place], [place] [address] [address] [place] has a good cheap selection, I like the socks, they are not as hard to get on  Walk as much as possible to increase blood flow.  Raise the foot of your bed at night with 2-inch blocks. SEEK MEDICAL CARE IF:   The skin around your ankle starts to break down.  You have pain, redness, tenderness, or hard swelling developing in your leg over a vein.  You are uncomfortable due to leg pain. Document Released: 08/26/2005 Document Revised: 02/08/2012 Document Reviewed: 01/12/2011 [facility] Patient Information 2014 [place].    Know what a healthy weight is for you (roughly BMI <25) and aim to maintain this  Aim for 7+ servings of fruits and vegetables daily  65-80+ fluid ounces of water or unsweet tea for healthy kidneys  Limit to max 1 drink of alcohol per day; avoid smoking/tobacco  Limit animal fats in diet for cholesterol and heart health - choose grass fed whenever available  Avoid highly  processed foods, and foods high in saturated/trans fats  Aim for low stress - take time to unwind and care for your mental health  Aim for 150 min of moderate intensity exercise weekly for heart health, and weights twice weekly for bone health  Aim for 7-9 hours of sleep daily

## 2020-10-03 NOTE — Progress Notes (Addendum)
Addendum: TSH, hypo increase dose levothyroxine 76mcg, one tablet five days a week and two tablets (70mcg) two days a week on Saturday & Tuesday.    COMPLETE PHYSICAL   Assessment and Plan:  Encounter for routine medical examination w/ abnormal findings Yearly  Hyperlipidemia, unspecified hyperlipidemia type Continue Rosuvastatin 10mg  daily -     Lipid panel check lipids decrease fatty foods increase activity.   Essential hypertension Controlled with lifestyle Monitor blood pressure at home; call if consistently over 130/80 Continue DASH diet.   Reminder to go to the ER if any CP, SOB, nausea, dizziness, severe HA, changes vision/speech, left arm numbness and tingling and jaw pain. -     CBC with Differential/Platelet -     COMPLETE METABOLIC PANEL WITH GFR -     Urinalysis, Routine w reflex microscopic -     Microalbumin / creatinine urine ratio -     EKG 12-Lead - continue medications, DASH diet, exercise and monitor at home. Call if greater than 130/80.   Hypothyroidism due to Hashimoto's thyroiditis Taking levothyroxine 25 mcg whole tablet 5 days a week and 1.5tablets two days a week (Tues & Thurs) Reminder to take on an empty stomach 30-69mins before first meal of the day. No antacid medications for 4 hours. -     TSH  Osteopenia, left neck femur DEXA UTD, monitored by GYN  Abnormal glucose With better eating and purposeful weight loss will defer A1C  Vitamin D deficiency Continue supplementation to maintain goal of 70-100 Taking Vitamin D 5,000 IU daily -     VITAMIN D 25 Hydroxy (Vit-D Deficiency, Fractures)  Allergic rhinitis, unspecified seasonality, unspecified trigger - Allegra OTC, increase H20, allergy hygiene explained.  Stage 3 chronic kidney disease, unspecified whether stage 3a or 3b CKD Increase fluids  Avoid NSAIDS Blood pressure control Monitor sugars  Will continue to monitor  Bilateral lower extremity edema Discussed compression  stockings Increase water intake  Medication management Continued   Further disposition pending results if labs check today. Discussed med's effects and SE's.   Over 40 minutes of face to face interview, exam, counseling, chart review, and critical decision making was performed.    Future Appointments  Date Time Provider [place]  04/23/2021  9:30 AM Garnet Sierras, NP [facility] None  10/06/2021 10:00 AM Sahily Biddle, Danton Sewer, NP [facility] None     HPI  64 y.o. female  presents for a Complete Physical.  She reports that she noticed a salty taste in her mouth about one month ago after returning from the beach.  She reports that it does not alter the taste of her food or drinks.  She denies any oral sores, white coating on tough, sore throat, coughing after food or drink.  Denies any excessive sweating or urination.    She has taken iron supplements in the past for anemia but reports she is not able to tolerate this.  It cause upset stomach despite taking with food as well as increased constipation.  She has bilateral lower extremity edema +1 with non pitting edema to bilateral ankles, lateral.   We changed her thyroid last visit, she is on 25 mcg five days a week and 1.5 tablets two days a week on Tues & Thurs.  Lab Results  Component Value Date   TSH 6.14 (H) 04/11/2020   BMI is Body mass index is 26.28 kg/m., she is working on diet and exercise. She has been on a no sugar, just veggies and fruit x Jan  and has lot 40+ lbs on purpose.  Today she is 146lbs up 6 pounds from last OV.  Wt Readings from Last 10 Encounters:  10/03/20 146 lb (66.2 kg)  06/07/20 140 lb 3.4 oz (63.6 kg)  04/11/20 133 lb 6.4 oz (60.5 kg)  01/30/20 129 lb (58.5 kg)  12/28/19 126 lb (57.2 kg)  09/20/19 121 lb 3.2 oz (55 kg)  08/14/19 121 lb 12.8 oz (55.2 kg)  07/07/19 122 lb (55.3 kg)  05/30/19 126 lb 3.2 oz (57.2 kg)  02/23/19 146 lb (66.2 kg)   Lab Results  Component Value Date    VITAMINB12 415 09/20/2019   Lab Results  Component Value Date   IRON 103 04/11/2020   TIBC 310 04/11/2020   FERRITIN 67 04/11/2020   Her blood pressure has been controlled at home, today their BP is  .  She does workout, 1 hour daily walking. She denies chest pain, shortness of breath, dizziness.    She is on cholesterol medication, crestor 10mg  and denies myalgias. Her cholesterol is at goal. The cholesterol last visit was:  Lab Results  Component Value Date   CHOL 156 04/11/2020   HDL 85 04/11/2020   LDLCALC 62 04/11/2020   TRIG 33 04/11/2020   CHOLHDL 1.8 04/11/2020  . She has been working on diet and exercise for prediabetes, . Last A1C in the office was:  Lab Results  Component Value Date   HGBA1C 5.3 05/30/2019   Patient is on Vitamin D supplement, 1500 IU a day.   Lab Results  Component Value Date   VD25OH 5 04/11/2020      She still sees Dr. Marvel Plan her Obgyn once yearly, went recently for and going to start vaginal estrogen cream for vaginal atrophy   Current Medications:   Current Outpatient Medications (Endocrine & Metabolic):  .  levothyroxine (SYNTHROID) 25 MCG tablet, Take 1 tablet daily on an empty stomach with only water for 30 minutes & no Antacid meds, Calcium or Magnesium for 4 hours & avoid Biotin  Current Outpatient Medications (Cardiovascular):  .  rosuvastatin (CRESTOR) 10 MG tablet, Take     1 tablet      Daily      for Cholesterol  Current Outpatient Medications (Respiratory):  .  cetirizine (ZYRTEC) 10 MG tablet, Take 10 mg by mouth daily.  Current Outpatient Medications (Analgesics):  .  HYDROcodone-acetaminophen (NORCO/VICODIN) 5-325 MG tablet, Take 1-2 tablets by mouth every 6 (six) hours as needed for moderate pain.   Current Outpatient Medications (Other):  .  cephALEXin (KEFLEX) 500 MG capsule, Take 1 capsule (500 mg total) by mouth 2 (two) times daily. .  Cholecalciferol (VITAMIN D3) 125 MCG (5000 UT) CAPS, Take by mouth daily. .   Multiple Vitamins-Minerals (ONE DAILY CALCIUM/IRON) TABS, Take by mouth daily.  Health Maintenance:   Immunization History  Administered Date(s) Administered  . Influenza Inj Mdck Quad With Preservative 09/06/2017, 09/12/2018  . Influenza Whole 09/02/2013  . Influenza, High Dose Seasonal PF 08/31/2019, 10/03/2020  . Influenza, Seasonal, Injecte, Preservative Fre 11/08/2015  . Influenza-Unspecified 07/29/2016  . PFIZER SARS-COV-2 Vaccination 01/02/2020, 02/01/2020  . PPD Test 04/24/2014  . Pneumococcal Conjugate-13 05/30/2019  . Pneumococcal Polysaccharide-23 04/13/2013  . Tdap 10/16/2008, 09/06/2017  . Zoster Recombinat (Shingrix) 08/31/2019, 02/21/2020   Tetanus: 2018 Pneumovax: 2014 Prevnar 13 2020 Flu vaccine: 09/2020  Shingrix 08/2019 COVID completed  Pap: Jan 2021 MGM: 08/2019 DEXA: 08/2020, osteopenia [facility], Dr Marvel Plan Colonoscopy: 2011 repeat 10 years- had benign 1 polyp-  Dr Ree Shay, will call Last Dental Exam: Once yearly, Dr. Trenton Gammon Last Eye Exam: Unknown 2021  Patient Care Team: Unk Pinto, MD as PCP - General (Internal Medicine) Irene Shipper, MD as Consulting Physician (Gastroenterology) Danella Sensing, MD as Consulting Physician (Dermatology) Rozetta Nunnery, MD as Consulting Physician (Otolaryngology) Paula Compton, MD as Consulting Physician (Obstetrics and Gynecology)  Medical History:  Past Medical History:  Diagnosis Date  . Allergic rhinitis   . Basal cell carcinoma   . Hyperlipidemia   . Hypothyroidism   . PONV (postoperative nausea and vomiting)   . Thyroid disease   . Vitamin D deficiency    Allergies No Known Allergies  SURGICAL HISTORY She  has a past surgical history that includes Tympanoplasty (Left) and Tympanoplasty (Right, 06/07/2020). FAMILY HISTORY Her family history includes Cancer in her maternal uncle; Cancer (age of onset: 64) in her sister; Heart attack in her mother; Heart disease in her mother;  Hyperlipidemia in her mother; Hypertension in her mother; Varicose Veins in her sister. SOCIAL HISTORY She  reports that she has never smoked. She has never used smokeless tobacco. She reports that she does not drink alcohol and does not use drugs.   Review of Systems: Review of Systems  Constitutional: Negative for chills, fever and malaise/fatigue.  HENT: Negative for congestion, ear pain and sore throat.   Eyes: Negative.   Respiratory: Negative for cough, shortness of breath and wheezing.   Cardiovascular: Negative for chest pain, palpitations and leg swelling.  Gastrointestinal: Negative for abdominal pain, blood in stool, constipation, diarrhea, heartburn and melena.  Genitourinary: Negative.   Musculoskeletal: Negative for back pain, falls, joint pain, myalgias and neck pain.  Skin: Negative.   Neurological: Negative for dizziness, sensory change, loss of consciousness and headaches.  Psychiatric/Behavioral: Negative for depression. The patient is not nervous/anxious and does not have insomnia.     Physical Exam: Estimated body mass index is 26.28 kg/m as calculated from the following:   Height as of this encounter: 5' 2.5" (1.588 m).   Weight as of this encounter: 146 lb (66.2 kg). Ht 5' 2.5" (1.588 m)   Wt 146 lb (66.2 kg)   BMI 26.28 kg/m   General Appearance: Well nourished well developed, in no apparent distress.  Eyes: PERRLA, EOMs, conjunctiva no swelling or erythema ENT/Mouth: Ear canals normal without obstruction, swelling, erythema, or discharge.  TMs normal bilaterally with no erythema, bulging, retraction, or loss of landmark.  Oropharynx moist and clear with no exudate, erythema, or swelling.   Neck: Supple, thyroid normal. No bruits.  No cervical adenopathy Respiratory: Respiratory effort normal, Breath sounds clear A&P without wheeze, rhonchi, rales.   Cardio: RRR without murmurs, rubs or gallops. Brisk peripheral pulses without edema.  Chest: symmetric, with  normal excursions Breasts: Symmetric, without lumps, nipple discharge, retractions.  Abdomen: Soft, nontender, no guarding, rebound, hernias, masses, or organomegaly.  Lymphatics: Non tender without lymphadenopathy.  Musculoskeletal: Full ROM all peripheral extremities,5/5 strength, and normal gait. Skin: Warm, dry without rashes, lesions, ecchymosis. Neuro: Awake and oriented X 3, Cranial nerves intact, reflexes equal bilaterally. Normal muscle tone, no cerebellar symptoms. Sensation intact.  Psych:  normal affect, Insight and Judgment appropriate.   EKG: NSR  Bayard Males, DNP [facility] 10/03/2020  1:37 PM

## 2020-10-04 LAB — COMPLETE METABOLIC PANEL WITH GFR
AG Ratio: 1.6 (calc) (ref 1.0–2.5)
ALT: 13 U/L (ref 6–29)
AST: 19 U/L (ref 10–35)
Albumin: 3.9 g/dL (ref 3.6–5.1)
Alkaline phosphatase (APISO): 60 U/L (ref 37–153)
BUN/Creatinine Ratio: 15 (calc) (ref 6–22)
BUN: 15 mg/dL (ref 7–25)
CO2: 30 mmol/L (ref 20–32)
Calcium: 9.1 mg/dL (ref 8.6–10.4)
Chloride: 106 mmol/L (ref 98–110)
Creat: 1 mg/dL — ABNORMAL HIGH (ref 0.50–0.99)
GFR, Est African American: 68 mL/min/{1.73_m2} (ref >=60)
GFR, Est Non African American: 59 mL/min/{1.73_m2} — ABNORMAL LOW (ref >=60)
Globulin: 2.4 g/dL (calc) (ref 1.9–3.7)
Glucose, Bld: 86 mg/dL (ref 65–99)
Potassium: 4.1 mmol/L (ref 3.5–5.3)
Sodium: 142 mmol/L (ref 135–146)
Total Bilirubin: 0.5 mg/dL (ref 0.2–1.2)
Total Protein: 6.3 g/dL (ref 6.1–8.1)

## 2020-10-04 LAB — CBC WITH DIFFERENTIAL/PLATELET
Absolute Monocytes: 376 cells/uL (ref 200–950)
Basophils Absolute: 60 cells/uL (ref 0–200)
Basophils Relative: 1.5 %
Eosinophils Absolute: 160 cells/uL (ref 15–500)
Eosinophils Relative: 4 %
HCT: 36 % (ref 35.0–45.0)
Hemoglobin: 11.8 g/dL (ref 11.7–15.5)
Lymphs Abs: 1228 cells/uL (ref 850–3900)
MCH: 32.3 pg (ref 27.0–33.0)
MCHC: 32.8 g/dL (ref 32.0–36.0)
MCV: 98.6 fL (ref 80.0–100.0)
MPV: 10.6 fL (ref 7.5–12.5)
Monocytes Relative: 9.4 %
Neutro Abs: 2176 cells/uL (ref 1500–7800)
Neutrophils Relative %: 54.4 %
Platelets: 164 10*3/uL (ref 140–400)
RBC: 3.65 10*6/uL — ABNORMAL LOW (ref 3.80–5.10)
RDW: 11.3 % (ref 11.0–15.0)
Total Lymphocyte: 30.7 %
WBC: 4 10*3/uL (ref 3.8–10.8)

## 2020-10-04 LAB — URINALYSIS W MICROSCOPIC + REFLEX CULTURE
Bacteria, UA: NONE SEEN /HPF
Bilirubin Urine: NEGATIVE
Glucose, UA: NEGATIVE
Hgb urine dipstick: NEGATIVE
Hyaline Cast: NONE SEEN /LPF
Ketones, ur: NEGATIVE
Leukocyte Esterase: NEGATIVE
Nitrites, Initial: NEGATIVE
Protein, ur: NEGATIVE
RBC / HPF: NONE SEEN /HPF (ref 0–2)
Specific Gravity, Urine: 1.008 (ref 1.001–1.03)
Squamous Epithelial / HPF: NONE SEEN /HPF (ref <=5)
WBC, UA: NONE SEEN /HPF (ref 0–5)
pH: 7 (ref 5.0–8.0)

## 2020-10-04 LAB — MAGNESIUM: Magnesium: 2.3 mg/dL (ref 1.5–2.5)

## 2020-10-04 LAB — VITAMIN D 25 HYDROXY (VIT D DEFICIENCY, FRACTURES): Vit D, 25-Hydroxy: 66 ng/mL (ref 30–100)

## 2020-10-04 LAB — PTH, INTACT AND CALCIUM
Calcium: 9.1 mg/dL (ref 8.6–10.4)
PTH: 34 pg/mL (ref 14–64)

## 2020-10-04 LAB — VITAMIN B12: Vitamin B-12: 363 pg/mL (ref 200–1100)

## 2020-10-04 LAB — LIPID PANEL
Cholesterol: 162 mg/dL (ref <200)
HDL: 81 mg/dL (ref >=50)
LDL Cholesterol (Calc): 69 mg/dL (calc)
Non-HDL Cholesterol (Calc): 81 mg/dL (calc) (ref <130)
Total CHOL/HDL Ratio: 2 (calc) (ref <5.0)
Triglycerides: 42 mg/dL (ref <150)

## 2020-10-04 LAB — HEMOGLOBIN A1C
Hgb A1c MFr Bld: 5.2 % of total Hgb (ref <5.7)
Mean Plasma Glucose: 103 (calc)
eAG (mmol/L): 5.7 (calc)

## 2020-10-04 LAB — TSH: TSH: 10.22 mIU/L — ABNORMAL HIGH (ref 0.40–4.50)

## 2020-10-04 LAB — NO CULTURE INDICATED

## 2020-11-11 ENCOUNTER — Other Ambulatory Visit: Payer: Self-pay | Admitting: Internal Medicine

## 2020-11-18 ENCOUNTER — Other Ambulatory Visit: Payer: Self-pay | Admitting: Internal Medicine

## 2020-12-25 ENCOUNTER — Encounter: Payer: Self-pay | Admitting: Internal Medicine

## 2020-12-25 ENCOUNTER — Other Ambulatory Visit: Payer: Self-pay | Admitting: Internal Medicine

## 2020-12-25 DIAGNOSIS — Z1211 Encounter for screening for malignant neoplasm of colon: Secondary | ICD-10-CM

## 2021-01-30 DIAGNOSIS — L821 Other seborrheic keratosis: Secondary | ICD-10-CM | POA: Diagnosis not present

## 2021-01-30 DIAGNOSIS — L570 Actinic keratosis: Secondary | ICD-10-CM | POA: Diagnosis not present

## 2021-02-05 ENCOUNTER — Other Ambulatory Visit: Payer: Self-pay | Admitting: Internal Medicine

## 2021-02-11 ENCOUNTER — Other Ambulatory Visit: Payer: Self-pay

## 2021-02-11 ENCOUNTER — Ambulatory Visit (AMBULATORY_SURGERY_CENTER): Payer: Medicare HMO

## 2021-02-11 VITALS — Ht 65.0 in | Wt 146.0 lb

## 2021-02-11 DIAGNOSIS — Z1211 Encounter for screening for malignant neoplasm of colon: Secondary | ICD-10-CM

## 2021-02-11 MED ORDER — SUTAB 1479-225-188 MG PO TABS
1.0000 | ORAL_TABLET | ORAL | 0 refills | Status: DC
Start: 2021-02-11 — End: 2021-02-25

## 2021-02-11 NOTE — Progress Notes (Signed)
Pre visit completed No egg or soy allergy known to patient  No issues with past sedation with any surgeries or procedures No intubation problems in the past  No FH of Malignant Hyperthermia No diet pills per patient No home 02 use per patient  No blood thinners per patient  Pt denies issues with constipation  No A fib or A flutter  EMMI video via [place] 19 guidelines implemented in PV today with Pt and RN  Coupon given to pt in PV today, Code to Pharmacy and  NO PA's for preps discussed with pt in PV today;  Discussed with pt there will be an out-of-pocket cost for prep and that varies from $0 to 70 dollars;  Due to the COVID-19 pandemic we are asking patients to follow certain guidelines.   Pt aware of COVID protocols and [facility] guidelines

## 2021-02-12 ENCOUNTER — Encounter: Payer: Self-pay | Admitting: Internal Medicine

## 2021-02-25 ENCOUNTER — Other Ambulatory Visit: Payer: Self-pay

## 2021-02-25 ENCOUNTER — Ambulatory Visit (AMBULATORY_SURGERY_CENTER): Payer: Medicare HMO | Admitting: Internal Medicine

## 2021-02-25 ENCOUNTER — Encounter: Payer: Self-pay | Admitting: Internal Medicine

## 2021-02-25 VITALS — BP 110/64 | HR 85 | Temp 97.2°F | Resp 12 | Ht 63.0 in | Wt 146.0 lb

## 2021-02-25 DIAGNOSIS — K635 Polyp of colon: Secondary | ICD-10-CM | POA: Diagnosis not present

## 2021-02-25 DIAGNOSIS — D124 Benign neoplasm of descending colon: Secondary | ICD-10-CM

## 2021-02-25 DIAGNOSIS — D120 Benign neoplasm of cecum: Secondary | ICD-10-CM

## 2021-02-25 DIAGNOSIS — E039 Hypothyroidism, unspecified: Secondary | ICD-10-CM | POA: Diagnosis not present

## 2021-02-25 DIAGNOSIS — E785 Hyperlipidemia, unspecified: Secondary | ICD-10-CM | POA: Diagnosis not present

## 2021-02-25 DIAGNOSIS — Z1211 Encounter for screening for malignant neoplasm of colon: Secondary | ICD-10-CM

## 2021-02-25 HISTORY — PX: COLONOSCOPY: SHX174

## 2021-02-25 MED ORDER — SODIUM CHLORIDE 0.9 % IV SOLN: 500.0000 mL | Freq: Once | INTRAVENOUS | Status: DC

## 2021-02-25 NOTE — Patient Instructions (Signed)
Await pathology results   YOU HAD AN ENDOSCOPIC PROCEDURE TODAY AT THE [facility]:   Refer to the procedure report that was given to you for any specific questions about what was found during the examination.  If the procedure report does not answer your questions, please call your gastroenterologist to clarify.  If you requested that your care partner not be given the details of your procedure findings, then the procedure report has been included in a sealed envelope for you to review at your convenience later.  YOU SHOULD EXPECT: Some feelings of bloating in the abdomen. Passage of more gas than usual.  Walking can help get rid of the air that was put into your GI tract during the procedure and reduce the bloating. If you had a lower endoscopy (such as a colonoscopy or flexible sigmoidoscopy) you may notice spotting of blood in your stool or on the toilet paper. If you underwent a bowel prep for your procedure, you may not have a normal bowel movement for a few days.  Please Note:  You might notice some irritation and congestion in your nose or some drainage.  This is from the oxygen used during your procedure.  There is no need for concern and it should clear up in a day or so.  SYMPTOMS TO REPORT IMMEDIATELY:   Following lower endoscopy (colonoscopy or flexible sigmoidoscopy):  Excessive amounts of blood in the stool  Significant tenderness or worsening of abdominal pains  Swelling of the abdomen that is new, acute  Fever of 100F or higher  For urgent or emergent issues, a gastroenterologist can be reached at any hour by calling [phone]. Do not use MyChart messaging for urgent concerns.    DIET:  We do recommend a small meal at first, but then you may proceed to your regular diet.  Drink plenty of fluids but you should avoid alcoholic beverages for 24 hours.  ACTIVITY:  You should plan to take it easy for the rest of today and you should NOT DRIVE or use heavy  machinery until tomorrow (because of the sedation medicines used during the test).    FOLLOW UP: Our staff will call the number listed on your records 48-72 hours following your procedure to check on you and address any questions or concerns that you may have regarding the information given to you following your procedure. If we do not reach you, we will leave a message.  We will attempt to reach you two times.  During this call, we will ask if you have developed any symptoms of COVID 19. If you develop any symptoms (ie: fever, flu-like symptoms, shortness of breath, cough etc.) before then, please call [phone].  If you test positive for Covid 19 in the 2 weeks post procedure, please call and report this information to us.    If any biopsies were taken you will be contacted by phone or by letter within the next 1-3 weeks.  Please call us at [phone] if you have not heard about the biopsies in 3 weeks.    SIGNATURES/CONFIDENTIALITY: You and/or your care partner have signed paperwork which will be entered into your electronic medical record.  These signatures attest to the fact that that the information above on your After Visit Summary has been reviewed and is understood.  Full responsibility of the confidentiality of this discharge information lies with you and/or your care-partner. 

## 2021-02-25 NOTE — Op Note (Signed)
[place] Patient Name: Tracy Brady Procedure Date: 02/25/2021 9:54 AM MRN: [id number] Endoscopist: Docia Chuck. Henrene Pastor , MD Age: 65 Referring MD:  Date of Birth: 1955/12/17 Gender: Female Account #: [id number] Procedure:                Colonoscopy with cold snare polypectomy x 1; with                            biopsies Indications:              Screening for colorectal malignant neoplasm.                            Previous examinations 2006, 2011. Negative for                            neoplasia Medicines:                Monitored Anesthesia Care Procedure:                Pre-Anesthesia Assessment:                           - Prior to the procedure, a History and Physical                            was performed, and patient medications and                            allergies were reviewed. The patient's tolerance of                            previous anesthesia was also reviewed. The risks                            and benefits of the procedure and the sedation                            options and risks were discussed with the patient.                            All questions were answered, and informed consent                            was obtained. Prior Anticoagulants: The patient has                            taken no previous anticoagulant or antiplatelet                            agents. ASA Grade Assessment: II - A patient with                            mild systemic disease. After reviewing the risks  and benefits, the patient was deemed in                            satisfactory condition to undergo the procedure.                           After obtaining informed consent, the colonoscope                            was passed under direct vision. Throughout the                            procedure, the patient's blood pressure, pulse, and                            oxygen saturations were monitored continuously. The                             Olympus CF-HQ190L (Serial# [id number]) Colonoscope was                            introduced through the anus and advanced to the the                            cecum, identified by appendiceal orifice and                            ileocecal valve. The ileocecal valve, appendiceal                            orifice, and rectum were photographed. The quality                            of the bowel preparation was good. The colonoscopy                            was performed without difficulty. The patient                            tolerated the procedure well. The bowel preparation                            used was SUPREP/tablets via split dose instruction. Scope In: 9:57:43 AM Scope Out: 10:16:11 AM Scope Withdrawal Time: 0 hours 12 minutes 41 seconds  Total Procedure Duration: 0 hours 18 minutes 28 seconds  Findings:                 A less than 1 mm polyp was found in the descending                            colon. The polyp was sessile. Biopsies were taken                            with a cold  forceps for histology.                           A 5 mm polyp was found in the cecum. The polyp was                            sessile. The polyp was removed with a cold snare.                            Resection and retrieval were complete.                           A diffuse area of mild melanosis was found in the                            entire colon.                           The exam was otherwise without abnormality on                            direct and retroflexion views. Complications:            No immediate complications. Estimated blood loss:                            None. Estimated Blood Loss:     Estimated blood loss: none. Impression:               - One less than 1 mm polyp in the descending colon.                            Biopsied.                           - One 5 mm polyp in the cecum, removed with a cold                            snare. Resected and  retrieved.                           - Melanosis in the colon.                           - The examination was otherwise normal on direct                            and retroflexion views. Recommendation:           - Repeat colonoscopy in 5-10 years for surveillance.                           - Patient has a contact number available for                            emergencies. The signs and symptoms of  potential                            delayed complications were discussed with the                            patient. Return to normal activities tomorrow.                            Written discharge instructions were provided to the                            patient.                           - Resume previous diet.                           - Continue present medications.                           - Await pathology results. Docia Chuck. Henrene Pastor, MD 02/25/2021 10:23:46 AM This report has been signed electronically.

## 2021-02-25 NOTE — Progress Notes (Signed)
Called to room to assist during endoscopic procedure.  Patient ID and intended procedure confirmed with present staff. Received instructions for my participation in the procedure from the performing physician.  

## 2021-02-25 NOTE — Progress Notes (Signed)
Pt's states no medical or surgical changes since previsit or office visit.  ° °Vitals CW °

## 2021-02-25 NOTE — Progress Notes (Signed)
Report to PACU, RN, vss, BBS= Clear.  

## 2021-02-27 ENCOUNTER — Telehealth: Payer: Self-pay

## 2021-02-27 NOTE — Telephone Encounter (Signed)
  Follow up Call-  Call back number 02/25/2021  Post procedure Call Back phone  # [phone]  Permission to leave phone message Yes  Some recent data might be hidden     Patient questions:  Do you have a fever, pain , or abdominal swelling? No. Pain Score  0 *  Have you tolerated food without any problems? Yes.    Have you been able to return to your normal activities? Yes.    Do you have any questions about your discharge instructions: Diet   No. Medications  No. Follow up visit  No.  Do you have questions or concerns about your Care? No.  Actions: * If pain score is 4 or above: No action needed, pain <4. 1. Have you developed a fever since your procedure? no  2.   Have you had an respiratory symptoms (SOB or cough) since your procedure? no  3.   Have you tested positive for COVID 19 since your procedure no  4.   Have you had any family members/close contacts diagnosed with the COVID 19 since your procedure?  no   If yes to any of these questions please route to Joylene John, RN and Joella Prince, RN

## 2021-03-04 ENCOUNTER — Encounter: Payer: Self-pay | Admitting: Internal Medicine

## 2021-04-22 ENCOUNTER — Ambulatory Visit: Payer: Medicare HMO | Admitting: Physician Assistant

## 2021-04-23 ENCOUNTER — Other Ambulatory Visit: Payer: Self-pay

## 2021-04-23 ENCOUNTER — Ambulatory Visit (INDEPENDENT_AMBULATORY_CARE_PROVIDER_SITE_OTHER): Payer: Medicare HMO | Admitting: Adult Health

## 2021-04-23 ENCOUNTER — Encounter: Payer: Self-pay | Admitting: Adult Health

## 2021-04-23 VITALS — BP 120/78 | HR 64 | Temp 96.6°F | Wt 150.0 lb

## 2021-04-23 DIAGNOSIS — Z0001 Encounter for general adult medical examination with abnormal findings: Secondary | ICD-10-CM

## 2021-04-23 DIAGNOSIS — R6889 Other general symptoms and signs: Secondary | ICD-10-CM

## 2021-04-23 DIAGNOSIS — Z6825 Body mass index (BMI) 25.0-25.9, adult: Secondary | ICD-10-CM

## 2021-04-23 DIAGNOSIS — E038 Other specified hypothyroidism: Secondary | ICD-10-CM | POA: Diagnosis not present

## 2021-04-23 DIAGNOSIS — R7309 Other abnormal glucose: Secondary | ICD-10-CM | POA: Diagnosis not present

## 2021-04-23 DIAGNOSIS — Z85828 Personal history of other malignant neoplasm of skin: Secondary | ICD-10-CM

## 2021-04-23 DIAGNOSIS — Z86010 Personal history of colonic polyps: Secondary | ICD-10-CM | POA: Diagnosis not present

## 2021-04-23 DIAGNOSIS — E559 Vitamin D deficiency, unspecified: Secondary | ICD-10-CM | POA: Diagnosis not present

## 2021-04-23 DIAGNOSIS — I10 Essential (primary) hypertension: Secondary | ICD-10-CM | POA: Diagnosis not present

## 2021-04-23 DIAGNOSIS — E785 Hyperlipidemia, unspecified: Secondary | ICD-10-CM

## 2021-04-23 DIAGNOSIS — Z0000 Encounter for general adult medical examination without abnormal findings: Secondary | ICD-10-CM

## 2021-04-23 DIAGNOSIS — J309 Allergic rhinitis, unspecified: Secondary | ICD-10-CM

## 2021-04-23 DIAGNOSIS — E063 Autoimmune thyroiditis: Secondary | ICD-10-CM

## 2021-04-23 NOTE — Patient Instructions (Addendum)
   Tracy Brady , Thank you for taking time to come for your Medicare Wellness Visit. I appreciate your ongoing commitment to your health goals. Please review the following plan we discussed and let me know if I can assist you in the future.   These are the goals we discussed: Goals    . 150 min weekly exercise       This is a list of the screening recommended for you and due dates:  Health Maintenance  Topic Date Due  . COVID-19 Vaccine (3 - Pfizer risk 4-dose series) 02/29/2020  . DEXA scan (bone density measurement)  08/08/2020  . Mammogram  08/13/2020  . Pneumonia vaccines (2 of 2 - PPSV23) 04/23/2022*  . Flu Shot  06/30/2021  . Tetanus Vaccine  09/07/2027  . Colon Cancer Screening  02/26/2031  . Hepatitis C Screening: USPSTF Recommendation to screen - Ages 84-79 yo.  Completed  . HPV Vaccine  Aged Out  *Topic was postponed. The date shown is not the original due date.    Please send covid 19 booster information -

## 2021-04-23 NOTE — Progress Notes (Signed)
MEDICARE WELLNESS   Assessment and Plan:  MEDICARE WELLNESS 1 year Tracy Brady requested Covid 19 booster information requested  Hyperlipidemia, unspecified hyperlipidemia type Continue medication decrease fatty foods - saturated fat increase activity.  -     Lipid panel  Essential hypertension -     Recently at goal by lifestyle/off of medications -     DASH diet, exercise and monitor at home. Call if greater than 130/80.  -     CBC with Differential/Platelet -     COMPLETE METABOLIC PANEL WITH GFR -     Magnesium  Hypothyroidism due to Hashimoto's thyroiditis -check TSH level, continue medications the same, reminded to take on an empty stomach 30-62mins before food.  -     TSH  Abnormal glucose Recent A1Cs at goal Discussed diet/exercise, weight management  Defer A1C; check CMP  Vitamin D deficiency Continue supplement;  -     VITAMIN D 25 Hydroxy (Vit-D Deficiency, Fractures) - Defer  Basal cell carcinoma (BCC), hx of  Monitor, derm follows  Allergic rhinitis, unspecified seasonality, unspecified trigger - Allegra OTC, increase H20, allergy hygiene explained.  History of colon polyps High fiber diet encouraged; next colonoscopy due 2032  Medication management -     Magnesium   BMI 25 Continue to recommend diet heavy in fruits and veggies and low in animal meats, cheeses, and dairy products, appropriate calorie intake Discuss exercise recommendations routinely Continue to monitor weight at each visit     Discussed med's effects and SE's. Screening labs and tests as requested with regular follow-up as recommended. Future Appointments  Date Time Provider [place]  10/06/2021 10:00 AM Tracy Sierras, NP [facility] None  04/23/2022  3:30 PM Tracy Comber, NP [facility] None    Plan:   During the course of the visit the patient was educated and counseled about appropriate screening and preventive services including:    Pneumococcal  vaccine   Prevnar 13  Influenza vaccine  Td vaccine  Screening electrocardiogram  Bone densitometry screening  Colorectal cancer screening  Diabetes screening  Glaucoma screening  Nutrition counseling   Advanced directives: requested   HPI  65 y.o. female  presents for a MEDICARE WELLNESS AND FOLLOW UP. She has Hyperlipidemia; Vitamin D deficiency; Allergic rhinitis; History of basal cell carcinoma; HTN (hypertension); Hypothyroidism; Abnormal glucose (hx of prediabetes); and History of colon polyps on their problem list.  History of BCC, lip and R eyelid, Dr. Ronnald Brady follows annually.   BMI is Body mass index is 26.57 kg/m., she has been working on diet and exercise, water aerobics 45 min 3 days a week.  Wt Readings from Last 3 Encounters:  04/23/21 150 lb (68 kg)  02/25/21 146 lb (66.2 kg)  02/11/21 146 lb (66.2 kg)   Her blood pressure has been controlled at home, today their BP is BP: 120/78.   She does workout, doing water aerobics. She denies chest pain, shortness of breath, dizziness.    She is on cholesterol medication, crestor 10 mg daily and denies myalgias. Her cholesterol is at goal. The cholesterol last visit was:  Lab Results  Component Value Date   CHOL 162 10/03/2020   HDL 81 10/03/2020   LDLCALC 69 10/03/2020   TRIG 42 10/03/2020   CHOLHDL 2.0 10/03/2020  . She has been working on diet and exercise for glucose management, hx of prediabetes (A1C 5.7% in 2017). Last A1C in the office was:  Lab Results  Component Value Date   HGBA1C 5.2 10/03/2020  She is on thyroid medication. Her medication was changed last visit.  She is now taking 25 mcg daily except Tues/Sat taking 2 tabs. Takes first in AM on empty stomach.  Lab Results  Component Value Date   TSH 10.22 (H) 10/03/2020    Pushes water intake, unsure how much. 4-5 glasses of water most days.  Lab Results  Component Value Date   GFRNONAA 54 (L) 10/03/2020   GFRNONAA 77 04/11/2020    GFRNONAA 66 12/28/2019   Patient is on Vitamin D supplement, 1500 IU a day.   Lab Results  Component Value Date   VD25OH 66 10/03/2020        Current Medications:   Current Outpatient Medications (Endocrine & Metabolic):  .  levothyroxine (SYNTHROID) 25 MCG tablet, TAKE 1 TABLET DAILY ON AN EMPTY STOMACH WITH ONLY WATER FOR 30 MINUTES & NO ANTACID MEDS, CALCIUM OR MAGNESIUM FOR 4 HOURS & AVOID BIOTIN  Current Outpatient Medications (Cardiovascular):  .  rosuvastatin (CRESTOR) 10 MG tablet, TAKE 1 TABLET BY MOUTH EVERY DAY FOR CHOLESTEROL  Current Outpatient Medications (Respiratory):  .  cetirizine (ZYRTEC) 10 MG tablet, Take 10 mg by mouth daily.    Current Outpatient Medications (Other):  Marland Kitchen  Cholecalciferol (VITAMIN D3) 125 MCG (5000 UT) CAPS, Take by mouth daily.  Health Maintenance:   Immunization History  Administered Date(s) Administered  . Influenza Inj Mdck Quad With Preservative 09/06/2017, 09/12/2018  . Influenza Whole 09/02/2013  . Influenza, High Dose Seasonal PF 08/31/2019, 10/03/2020  . Influenza, Seasonal, Injecte, Preservative Fre 11/08/2015  . Influenza-Unspecified 07/29/2016  . PFIZER(Purple Top)SARS-COV-2 Vaccination 01/02/2020, 02/01/2020  . PPD Test 04/24/2014  . Pneumococcal Conjugate-13 05/30/2019  . Pneumococcal Polysaccharide-23 04/13/2013  . Tdap 10/16/2008, 09/06/2017  . Zoster Recombinat (Shingrix) 08/31/2019, 02/21/2020    Tetanus: 2018 Pneumovax: 2014 Prevnar 13 2020 Flu vaccine: 2021 Shingrix: 2/2 COVID 19: 2/2, - has had booster, send information  Pap: Jan 2021, has upcoming with Dr. Marvel Plan MGM: 08/2019 - Brady had 2021 at [place], report reqeusted DEXA: 07/2018 osteopenia -1.2 at [place], Brady had 2021 at [facility]- report   Colonoscopy: 01/2021, benign polyps, Dr. Henrene Brady, repeat 10 years  Last Dental Exam: Dr. Trenton Brady, 2021, q52m Last Eye Exam: Dr. Delman Brady, last 2021, mild L catact   Patient Care Team: Tracy Pinto, MD as PCP -  General (Internal Medicine) Tracy Shipper, MD as Consulting Physician (Gastroenterology) Tracy Sensing, MD as Consulting Physician (Dermatology) Tracy Nunnery, MD as Consulting Physician (Otolaryngology) Tracy Compton, MD as Consulting Physician (Obstetrics and Gynecology)  Medical History:  Past Medical History:  Diagnosis Date  . Allergic rhinitis   . Allergy    seasonal allergies  . Basal cell carcinoma   . Cataract    LEFT-not a surgical candidate at this time (02/11/2021)  . Hyperlipidemia    on meds  . Hypothyroidism   . PONV (postoperative nausea and vomiting)   . Thyroid disease    on meds  . Vitamin D deficiency    Allergies No Known Allergies  SURGICAL HISTORY She  has a past surgical history that includes Tympanoplasty (Left); Tympanoplasty (Right, 06/07/2020); Wisdom tooth extraction; Colonoscopy (2011); and Colonoscopy (02/25/2021). FAMILY HISTORY Her family history includes Cancer in her maternal uncle; Cancer (age of onset: 42) in her sister; Heart attack in her mother; Heart disease in her mother; Hyperlipidemia in her mother; Hypertension in her mother; Varicose Veins in her sister. SOCIAL HISTORY She  Brady that she has never smoked. She has never used  smokeless tobacco. She Brady that she does not drink alcohol and does not use drugs.  MEDICARE WELLNESS OBJECTIVES: Physical activity: Current Exercise Habits: Home exercise routine, Type of exercise: Other - see comments (water aerobics), Time (Minutes): 45, Frequency (Times/Week): 3, Weekly Exercise (Minutes/Week): 135, Intensity: Mild, Exercise limited by: None identified Cardiac risk factors: Cardiac Risk Factors include: advanced age (>79men, >64 women);dyslipidemia;family history of premature cardiovascular disease Depression/mood screen:   Depression screen [facility] 2/9 04/23/2021  Decreased Interest 0  Down, Depressed, Hopeless 0  PHQ - 2 Score 0    ADLs:  In your present state of health, do  you have any difficulty performing the following activities: 04/23/2021 06/07/2020  Hearing? N N  Vision? N Y  Difficulty concentrating or making decisions? N N  Walking or climbing stairs? N N  Dressing or bathing? N N  Doing errands, shopping? N -  Some recent data might be hidden     Cognitive Testing  Alert? Yes  Normal Appearance?Yes  Oriented to person? Yes  Place? Yes   Time? Yes  Recall of three objects?  Yes  Can perform simple calculations? Yes  Displays appropriate judgment?Yes  Can read the correct time from a watch face?Yes  EOL planning: Does Patient Have a Medical Advance Directive?: No Type of Advance Directive: [place] will Does patient want to make changes to medical advance directive?: No - Patient declined Copy of [place] in Chart?: No - copy requested Would patient like information on creating a medical advance directive?: No - Patient declined   Review of Systems: Review of Systems  Constitutional: Negative for malaise/fatigue and weight loss.  HENT: Negative for hearing loss and tinnitus.   Eyes: Negative for blurred vision and double vision.  Respiratory: Negative for cough, sputum production, shortness of breath and wheezing.   Cardiovascular: Negative for chest pain, palpitations, orthopnea, claudication, leg swelling and PND.  Gastrointestinal: Negative for abdominal pain, blood in stool, constipation, diarrhea, heartburn, melena, nausea and vomiting.  Genitourinary: Negative.   Musculoskeletal: Negative for falls, joint pain and myalgias.  Skin: Negative for rash.  Neurological: Negative for dizziness, tingling, sensory change, weakness and headaches.  Endo/Heme/Allergies: Negative for polydipsia.  Psychiatric/Behavioral: Negative.  Negative for depression, memory loss, substance abuse and suicidal ideas. The patient is not nervous/anxious and does not have insomnia.   All other systems reviewed and are  negative.   Physical Exam: Estimated body mass index is 26.57 kg/m as calculated from the following:   Height as of 02/25/21: 5\' 3"  (1.6 m).   Weight as of this encounter: 150 lb (68 kg). BP 120/78   Pulse 64   Temp (!) 96.6 F (35.9 C)   Wt 150 lb (68 kg)   SpO2 99%   BMI 26.57 kg/m   General Appearance: Well nourished well developed, in no apparent distress.  Eyes: PERRLA, EOMs, conjunctiva no swelling or erythema ENT/Mouth: Ear canals - L normal without obstruction, swelling, erythema, or discharge. R partially obstructed by dry wax. L TM normal bilaterally with no erythema, bulging, retraction, or loss of landmark.  Oropharynx moist and clear with no exudate, erythema, or swelling.   Neck: Supple, thyroid normal. No bruits.  No cervical adenopathy Respiratory: Respiratory effort normal, Breath sounds clear A&P without wheeze, rhonchi, rales.   Cardio: RRR without murmurs, rubs or gallops. Brisk peripheral pulses without edema.  Chest: symmetric, with normal excursions Breasts: Symmetric, without lumps, nipple discharge, retractions.  Abdomen: Soft, nontender, no  guarding, rebound, hernias, masses, or organomegaly.  Lymphatics: Non tender without lymphadenopathy.  Musculoskeletal: Full ROM all peripheral extremities,5/5 strength, and normal gait. Skin: Warm, dry without rashes, lesions, ecchymosis. Neuro: Awake and oriented X 3, Cranial nerves intact, reflexes equal bilaterally. Normal muscle tone, no cerebellar symptoms. Sensation intact.  Psych:  normal affect, Insight and Judgment appropriate.    Medicare Attestation I have personally reviewed: The patient's medical and social history Their use of alcohol, tobacco or illicit drugs Their current medications and supplements The patient's functional ability including ADLs,fall risks, home safety risks, cognitive, and hearing and visual impairment Diet and physical activities Evidence for depression or mood disorders  The  patient's weight, height, BMI, and visual acuity have been recorded in the chart.  I have made referrals, counseling, and provided education to the patient based on review of the above and I have provided the patient with a written personalized care plan for preventive services.     Tracy Brady 5:46 PM [facility]

## 2021-04-24 ENCOUNTER — Other Ambulatory Visit: Payer: Self-pay | Admitting: Adult Health

## 2021-04-24 DIAGNOSIS — E038 Other specified hypothyroidism: Secondary | ICD-10-CM

## 2021-04-24 LAB — COMPLETE METABOLIC PANEL WITH GFR
AG Ratio: 1.5 (calc) (ref 1.0–2.5)
ALT: 14 U/L (ref 6–29)
AST: 18 U/L (ref 10–35)
Albumin: 4 g/dL (ref 3.6–5.1)
Alkaline phosphatase (APISO): 62 U/L (ref 37–153)
BUN: 12 mg/dL (ref 7–25)
CO2: 30 mmol/L (ref 20–32)
Calcium: 9.2 mg/dL (ref 8.6–10.4)
Chloride: 107 mmol/L (ref 98–110)
Creat: 0.71 mg/dL (ref 0.50–0.99)
GFR, Est African American: 102 mL/min/{1.73_m2} (ref >=60)
GFR, Est Non African American: 88 mL/min/{1.73_m2} (ref >=60)
Globulin: 2.7 g/dL (calc) (ref 1.9–3.7)
Glucose, Bld: 82 mg/dL (ref 65–99)
Potassium: 4.1 mmol/L (ref 3.5–5.3)
Sodium: 143 mmol/L (ref 135–146)
Total Bilirubin: 0.5 mg/dL (ref 0.2–1.2)
Total Protein: 6.7 g/dL (ref 6.1–8.1)

## 2021-04-24 LAB — CBC WITH DIFFERENTIAL/PLATELET
Absolute Monocytes: 506 cells/uL (ref 200–950)
Basophils Absolute: 61 cells/uL (ref 0–200)
Basophils Relative: 1.1 %
Eosinophils Absolute: 209 cells/uL (ref 15–500)
Eosinophils Relative: 3.8 %
HCT: 35.3 % (ref 35.0–45.0)
Hemoglobin: 11.6 g/dL — ABNORMAL LOW (ref 11.7–15.5)
Lymphs Abs: 1623 cells/uL (ref 850–3900)
MCH: 31.8 pg (ref 27.0–33.0)
MCHC: 32.9 g/dL (ref 32.0–36.0)
MCV: 96.7 fL (ref 80.0–100.0)
MPV: 10.8 fL (ref 7.5–12.5)
Monocytes Relative: 9.2 %
Neutro Abs: 3102 cells/uL (ref 1500–7800)
Neutrophils Relative %: 56.4 %
Platelets: 159 10*3/uL (ref 140–400)
RBC: 3.65 10*6/uL — ABNORMAL LOW (ref 3.80–5.10)
RDW: 11.8 % (ref 11.0–15.0)
Total Lymphocyte: 29.5 %
WBC: 5.5 10*3/uL (ref 3.8–10.8)

## 2021-04-24 LAB — MAGNESIUM: Magnesium: 2.3 mg/dL (ref 1.5–2.5)

## 2021-04-24 LAB — LIPID PANEL
Cholesterol: 154 mg/dL (ref <200)
HDL: 84 mg/dL (ref >=50)
LDL Cholesterol (Calc): 58 mg/dL (calc)
Non-HDL Cholesterol (Calc): 70 mg/dL (calc) (ref <130)
Total CHOL/HDL Ratio: 1.8 (calc) (ref <5.0)
Triglycerides: 43 mg/dL (ref <150)

## 2021-04-24 LAB — TSH: TSH: 5.55 mIU/L — ABNORMAL HIGH (ref 0.40–4.50)

## 2021-04-24 MED ORDER — LEVOTHYROXINE SODIUM 25 MCG PO TABS: ORAL_TABLET | ORAL | 1 refills | Status: DC

## 2021-05-12 ENCOUNTER — Encounter: Payer: Self-pay | Admitting: Internal Medicine

## 2021-06-19 ENCOUNTER — Other Ambulatory Visit: Payer: Self-pay

## 2021-06-19 ENCOUNTER — Ambulatory Visit (INDEPENDENT_AMBULATORY_CARE_PROVIDER_SITE_OTHER): Payer: Medicare HMO

## 2021-06-19 DIAGNOSIS — E038 Other specified hypothyroidism: Secondary | ICD-10-CM

## 2021-06-19 DIAGNOSIS — E063 Autoimmune thyroiditis: Secondary | ICD-10-CM | POA: Diagnosis not present

## 2021-06-19 LAB — TSH: TSH: 6.35 mIU/L — ABNORMAL HIGH (ref 0.40–4.50)

## 2021-06-19 NOTE — Progress Notes (Signed)
Patient was here for a nurses visit to have TSH drawn. Vitals taken and recorded.

## 2021-06-20 ENCOUNTER — Other Ambulatory Visit: Payer: Self-pay | Admitting: Adult Health

## 2021-06-20 DIAGNOSIS — E038 Other specified hypothyroidism: Secondary | ICD-10-CM

## 2021-06-27 DIAGNOSIS — Z01419 Encounter for gynecological examination (general) (routine) without abnormal findings: Secondary | ICD-10-CM | POA: Diagnosis not present

## 2021-07-29 ENCOUNTER — Ambulatory Visit (INDEPENDENT_AMBULATORY_CARE_PROVIDER_SITE_OTHER): Payer: Medicare HMO

## 2021-07-29 ENCOUNTER — Other Ambulatory Visit: Payer: Self-pay

## 2021-07-29 DIAGNOSIS — E063 Autoimmune thyroiditis: Secondary | ICD-10-CM | POA: Diagnosis not present

## 2021-07-29 DIAGNOSIS — E038 Other specified hypothyroidism: Secondary | ICD-10-CM | POA: Diagnosis not present

## 2021-07-29 NOTE — Progress Notes (Signed)
The patient came in for recheck of TSH. She was doing well today with no questions or concerns.   When do you take your medication? The patient reports that she takes her medication around 7:00 am each morning.   What is the mcg and quantity of your medication? The patient reports that she takes two tablets of 25 mcg Levothyroxine every morning.   Do you take the medication with water? The patient reports that she takes the medication with water only.   How long do you wait to eat after taking your medication? The patient reports that she waits an entire hour before eating after taking her medication.   Do you take your medication with any biotin or biotin related products? The patient reports that she does not taken biotin or biotin related products.

## 2021-07-30 LAB — TSH: TSH: 2.56 mIU/L (ref 0.40–4.50)

## 2021-08-08 ENCOUNTER — Other Ambulatory Visit: Payer: Self-pay | Admitting: Adult Health

## 2021-08-27 DIAGNOSIS — Z1231 Encounter for screening mammogram for malignant neoplasm of breast: Secondary | ICD-10-CM | POA: Diagnosis not present

## 2021-08-27 LAB — HM MAMMOGRAPHY

## 2021-08-29 NOTE — Telephone Encounter (Signed)
Please enter into chart

## 2021-10-01 NOTE — Progress Notes (Signed)
COMPLETE PHYSICAL   Assessment and Plan:  Encounter for routine medical examination w/ abnormal findings Yearly  Hyperlipidemia, unspecified hyperlipidemia type Continue Rosuvastatin 10mg  daily -     Lipid panel check lipids decrease fatty foods increase activity.   Essential hypertension Controlled with lifestyle Monitor blood pressure at home; call if consistently over 130/80 Continue DASH diet.   Reminder to go to the ER if any CP, SOB, nausea, dizziness, severe HA, changes vision/speech, left arm numbness and tingling and jaw pain. -     CBC with Differential/Platelet -     COMPLETE METABOLIC PANEL WITH GFR -     Urinalysis, Routine w reflex microscopic -     Microalbumin / creatinine urine ratio -     EKG 12-Lead -  DASH diet, exercise and monitor at home. Call if greater than 130/80.   Hypothyroidism due to Hashimoto's thyroiditis Taking levothyroxine 25 mcg 2 tabs daily Reminder to take on an empty stomach 30-70mins before first meal of the day. No antacid medications for 4 hours. -     TSH  Osteopenia, left neck femur DEXA UTD, monitored by GYN  Abnormal glucose With better eating and purposeful weight loss  A1c  BMI 28 Continue diet and exercise Stressed importance of fresh fruits and vegetables, fiber, water  Vitamin D deficiency Continue supplementation to maintain goal of 70-100 Taking Vitamin D 5,000 IU daily -     VITAMIN D 25 Hydroxy (Vit-D Deficiency, Fractures)  Allergic rhinitis, unspecified seasonality, unspecified trigger - Allegra OTC, increase H20, allergy hygiene explained.  Stage 3 chronic kidney disease, unspecified whether stage 3a or 3b CKD Increase fluids  Avoid NSAIDS Blood pressure control Monitor sugars  Will continue to monitor  Medication management Magnesium  Screening for ischemic heart disease EKG   Further disposition pending results if labs check today. Discussed med's effects and SE's.   Over 40 minutes of  face to face interview, exam, counseling, chart review, and critical decision making was performed.    Future Appointments  Date Time Provider [place]  04/23/2022  3:30 PM Liane Comber, NP [facility] None  10/06/2022 10:00 AM Magda Bernheim, NP [facility] None     HPI  65 y.o. female  presents for a Complete Physical. has Hyperlipidemia; Vitamin D deficiency; Allergic rhinitis; History of basal cell carcinoma; HTN (hypertension); Hypothyroidism; Abnormal glucose (hx of prediabetes); and History of colon polyps on their problem list.   She has been using Metamucil daily for constipation and it is well controlled with this regimen.  We changed her thyroid last visit, she is on 25 mcg 2 tabs daily. If next lab is normal will send in a script for 50 mcg QD  Lab Results  Component Value Date   TSH 2.56 07/29/2021   BMI is Body mass index is 28.47 kg/m., she has been working on diet and exercise. Wt Readings from Last 3 Encounters:  10/06/21 158 lb 3.2 oz (71.8 kg)  07/29/21 157 lb 6.4 oz (71.4 kg)  06/19/21 155 lb (70.3 kg)    Her blood pressure has been controlled at home, today their BP is BP: 104/70.  She does workout, 1 hour daily walking. She denies chest pain, shortness of breath, dizziness.    She is on cholesterol medication, crestor 10mg  and denies myalgias. Her cholesterol is at goal. The cholesterol last visit was:  Lab Results  Component Value Date   CHOL 154 04/23/2021   HDL 84 04/23/2021   LDLCALC 58 04/23/2021  TRIG 43 04/23/2021   CHOLHDL 1.8 04/23/2021  . She has been working on diet and exercise for prediabetes, . Last A1C in the office was:  Lab Results  Component Value Date   HGBA1C 5.2 10/03/2020   Patient is on Vitamin D supplement, 1500 IU a day.   Lab Results  Component Value Date   VD25OH 66 10/03/2020      She still sees Dr. Marvel Plan her Obgyn once yearly. Had mammogram at [facility] last month will sign for results.  Current  Medications:   Current Outpatient Medications (Endocrine & Metabolic):    levothyroxine (SYNTHROID) 25 MCG tablet, TAKE 1-2 TABLET DAILY AS DIRECTED ON AN EMPTY STOMACH WITH ONLY WATER FOR 30 MINUTES & NO ANTACID MEDS, CALCIUM OR MAGNESIUM FOR 4 HOURS & AVOID BIOTIN  Current Outpatient Medications (Cardiovascular):    rosuvastatin (CRESTOR) 10 MG tablet, TAKE 1 TABLET BY MOUTH EVERY DAY FOR CHOLESTEROL  Current Outpatient Medications (Respiratory):    cetirizine (ZYRTEC) 10 MG tablet, Take 10 mg by mouth daily.    Current Outpatient Medications (Other):    Cholecalciferol (VITAMIN D3) 125 MCG (5000 UT) CAPS, Take by mouth daily.  Health Maintenance:   Immunization History  Administered Date(s) Administered   Influenza Inj Mdck Quad With Preservative 09/06/2017, 09/12/2018   Influenza Whole 09/02/2013   Influenza, High Dose Seasonal PF 08/31/2019, 10/03/2020, 08/27/2021   Influenza, Seasonal, Injecte, Preservative Fre 11/08/2015   Influenza-Unspecified 07/29/2016   Moderna SARS-COV2 Booster Vaccination 08/27/2021   PFIZER(Purple Top)SARS-COV-2 Vaccination 01/02/2020, 02/01/2020   PPD Test 04/24/2014   Pneumococcal Conjugate-13 05/30/2019   Pneumococcal Polysaccharide-23 04/13/2013   Tdap 10/16/2008, 09/06/2017   Zoster Recombinat (Shingrix) 08/31/2019, 02/21/2020   Tetanus: 2018 Pneumovax: 2014 Prevnar 13 2020 Flu vaccine: 07/2021 Shingrix 08/2019 COVID completed  Pap: Jan 2021 MGM:10/22 DEXA: 08/2020, osteopenia [place], Dr Marvel Plan Colonoscopy: 02/25/21 repeat 01/2031 Last Dental Exam: Once yearly, Dr. Trenton Gammon Last Eye Exam: Unknown 2021  Patient Care Team: Unk Pinto, MD as PCP - General (Internal Medicine) Irene Shipper, MD as Consulting Physician (Gastroenterology) Danella Sensing, MD as Consulting Physician (Dermatology) Rozetta Nunnery, MD as Consulting Physician (Otolaryngology) Paula Compton, MD as Consulting Physician (Obstetrics and  Gynecology)  Medical History:  Past Medical History:  Diagnosis Date   Allergic rhinitis    Allergy    seasonal allergies   Basal cell carcinoma    Cataract    LEFT-not a surgical candidate at this time (02/11/2021)   Hyperlipidemia    on meds   Hypothyroidism    PONV (postoperative nausea and vomiting)    Thyroid disease    on meds   Vitamin D deficiency    Allergies Not on File  SURGICAL HISTORY She  has a past surgical history that includes Tympanoplasty (Left); Tympanoplasty (Right, 06/07/2020); Wisdom tooth extraction; Colonoscopy (2011); and Colonoscopy (02/25/2021). FAMILY HISTORY Her family history includes Cancer in her maternal uncle; Cancer (age of onset: 61) in her sister; Heart attack in her mother; Heart disease in her mother; Hyperlipidemia in her mother; Hypertension in her mother; Varicose Veins in her sister. SOCIAL HISTORY She  reports that she has never smoked. She has never used smokeless tobacco. She reports that she does not drink alcohol and does not use drugs.   Review of Systems: Review of Systems  Constitutional:  Negative for chills, fever and malaise/fatigue.  HENT:  Negative for congestion, ear pain, hearing loss, sinus pain, sore throat and tinnitus.   Eyes: Negative.  Negative for blurred vision  and double vision.  Respiratory:  Negative for cough, hemoptysis, sputum production, shortness of breath and wheezing.   Cardiovascular:  Negative for chest pain, palpitations and leg swelling.  Gastrointestinal:  Negative for abdominal pain, blood in stool, constipation, diarrhea, heartburn, melena, nausea and vomiting.  Genitourinary: Negative.  Negative for dysuria and urgency.  Musculoskeletal:  Negative for back pain, falls, joint pain, myalgias and neck pain.  Skin: Negative.  Negative for rash.  Neurological:  Negative for dizziness, tingling, tremors, sensory change, loss of consciousness, weakness and headaches.  Endo/Heme/Allergies:  Does not  bruise/bleed easily.  Psychiatric/Behavioral:  Negative for depression and suicidal ideas. The patient is not nervous/anxious and does not have insomnia.    Physical Exam: Estimated body mass index is 28.47 kg/m as calculated from the following:   Height as of this encounter: 5' 2.5" (1.588 m).   Weight as of this encounter: 158 lb 3.2 oz (71.8 kg). BP 104/70   Pulse 87   Temp (!) 97.5 F (36.4 C)   Ht 5' 2.5" (1.588 m)   Wt 158 lb 3.2 oz (71.8 kg)   SpO2 98%   BMI 28.47 kg/m   General Appearance: Well nourished well developed, in no apparent distress.  Eyes: PERRLA, EOMs, conjunctiva no swelling or erythema ENT/Mouth: Ear canals normal without obstruction, swelling, erythema, or discharge.  TMs normal bilaterally with no erythema, bulging, retraction, or loss of landmark.  Oropharynx moist and clear with no exudate, erythema, or swelling.   Neck: Supple, thyroid normal. No bruits.  No cervical adenopathy Respiratory: Respiratory effort normal, Breath sounds clear A&P without wheeze, rhonchi, rales.   Cardio: RRR without murmurs, rubs or gallops. Brisk peripheral pulses without edema.  Chest: symmetric, with normal excursions Breasts: Symmetric, without lumps, nipple discharge, retractions.  Abdomen: Soft, nontender, no guarding, rebound, hernias, masses, or organomegaly.  Lymphatics: Non tender without lymphadenopathy.  Musculoskeletal: Full ROM all peripheral extremities,5/5 strength, and normal gait. Skin: Warm, dry without rashes, lesions, ecchymosis. Neuro: Awake and oriented X 3, Cranial nerves intact, reflexes equal bilaterally. Normal muscle tone, no cerebellar symptoms. Sensation intact.  Psych:  normal affect, Insight and Judgment appropriate.   EKG: NSR  [facility]  10/06/2021

## 2021-10-06 ENCOUNTER — Ambulatory Visit (INDEPENDENT_AMBULATORY_CARE_PROVIDER_SITE_OTHER): Payer: Medicare HMO | Admitting: Nurse Practitioner

## 2021-10-06 ENCOUNTER — Encounter: Payer: Self-pay | Admitting: Nurse Practitioner

## 2021-10-06 ENCOUNTER — Other Ambulatory Visit: Payer: Self-pay

## 2021-10-06 VITALS — BP 104/70 | HR 87 | Temp 97.5°F | Ht 62.5 in | Wt 158.2 lb

## 2021-10-06 DIAGNOSIS — Z79899 Other long term (current) drug therapy: Secondary | ICD-10-CM | POA: Diagnosis not present

## 2021-10-06 DIAGNOSIS — Z136 Encounter for screening for cardiovascular disorders: Secondary | ICD-10-CM | POA: Diagnosis not present

## 2021-10-06 DIAGNOSIS — I10 Essential (primary) hypertension: Secondary | ICD-10-CM | POA: Diagnosis not present

## 2021-10-06 DIAGNOSIS — M8580 Other specified disorders of bone density and structure, unspecified site: Secondary | ICD-10-CM

## 2021-10-06 DIAGNOSIS — R7309 Other abnormal glucose: Secondary | ICD-10-CM

## 2021-10-06 DIAGNOSIS — J309 Allergic rhinitis, unspecified: Secondary | ICD-10-CM

## 2021-10-06 DIAGNOSIS — E063 Autoimmune thyroiditis: Secondary | ICD-10-CM

## 2021-10-06 DIAGNOSIS — E038 Other specified hypothyroidism: Secondary | ICD-10-CM

## 2021-10-06 DIAGNOSIS — Z0000 Encounter for general adult medical examination without abnormal findings: Secondary | ICD-10-CM | POA: Diagnosis not present

## 2021-10-06 DIAGNOSIS — N1831 Chronic kidney disease, stage 3a: Secondary | ICD-10-CM

## 2021-10-06 DIAGNOSIS — Z0001 Encounter for general adult medical examination with abnormal findings: Secondary | ICD-10-CM

## 2021-10-06 DIAGNOSIS — E559 Vitamin D deficiency, unspecified: Secondary | ICD-10-CM | POA: Diagnosis not present

## 2021-10-06 DIAGNOSIS — E785 Hyperlipidemia, unspecified: Secondary | ICD-10-CM

## 2021-10-06 DIAGNOSIS — Z6828 Body mass index (BMI) 28.0-28.9, adult: Secondary | ICD-10-CM

## 2021-10-06 NOTE — Patient Instructions (Signed)

## 2021-10-07 LAB — COMPLETE METABOLIC PANEL WITH GFR
AG Ratio: 1.3 (calc) (ref 1.0–2.5)
ALT: 9 U/L (ref 6–29)
AST: 15 U/L (ref 10–35)
Albumin: 3.7 g/dL (ref 3.6–5.1)
Alkaline phosphatase (APISO): 59 U/L (ref 37–153)
BUN: 19 mg/dL (ref 7–25)
CO2: 29 mmol/L (ref 20–32)
Calcium: 9 mg/dL (ref 8.6–10.4)
Chloride: 105 mmol/L (ref 98–110)
Creat: 0.84 mg/dL (ref 0.50–1.05)
Globulin: 2.8 g/dL (calc) (ref 1.9–3.7)
Glucose, Bld: 83 mg/dL (ref 65–99)
Potassium: 4.1 mmol/L (ref 3.5–5.3)
Sodium: 141 mmol/L (ref 135–146)
Total Bilirubin: 0.4 mg/dL (ref 0.2–1.2)
Total Protein: 6.5 g/dL (ref 6.1–8.1)
eGFR: 76 mL/min/{1.73_m2} (ref >=60)

## 2021-10-07 LAB — CBC WITH DIFFERENTIAL/PLATELET
Absolute Monocytes: 345 cells/uL (ref 200–950)
Basophils Absolute: 60 cells/uL (ref 0–200)
Basophils Relative: 1.2 %
Eosinophils Absolute: 250 cells/uL (ref 15–500)
Eosinophils Relative: 5 %
HCT: 35 % (ref 35.0–45.0)
Hemoglobin: 11.6 g/dL — ABNORMAL LOW (ref 11.7–15.5)
Lymphs Abs: 1260 cells/uL (ref 850–3900)
MCH: 32.2 pg (ref 27.0–33.0)
MCHC: 33.1 g/dL (ref 32.0–36.0)
MCV: 97.2 fL (ref 80.0–100.0)
MPV: 10.8 fL (ref 7.5–12.5)
Monocytes Relative: 6.9 %
Neutro Abs: 3085 cells/uL (ref 1500–7800)
Neutrophils Relative %: 61.7 %
Platelets: 180 10*3/uL (ref 140–400)
RBC: 3.6 10*6/uL — ABNORMAL LOW (ref 3.80–5.10)
RDW: 11.8 % (ref 11.0–15.0)
Total Lymphocyte: 25.2 %
WBC: 5 10*3/uL (ref 3.8–10.8)

## 2021-10-07 LAB — URINALYSIS, ROUTINE W REFLEX MICROSCOPIC
Bacteria, UA: NONE SEEN /HPF
Bilirubin Urine: NEGATIVE
Glucose, UA: NEGATIVE
Hgb urine dipstick: NEGATIVE
Hyaline Cast: NONE SEEN /LPF
Ketones, ur: NEGATIVE
Nitrite: NEGATIVE
Protein, ur: NEGATIVE
Specific Gravity, Urine: 1.016 (ref 1.001–1.035)
WBC, UA: NONE SEEN /HPF (ref 0–5)
pH: 7 (ref 5.0–8.0)

## 2021-10-07 LAB — HEMOGLOBIN A1C
Hgb A1c MFr Bld: 5.2 % of total Hgb (ref <5.7)
Mean Plasma Glucose: 103 mg/dL
eAG (mmol/L): 5.7 mmol/L

## 2021-10-07 LAB — LIPID PANEL
Cholesterol: 154 mg/dL (ref <200)
HDL: 79 mg/dL (ref >=50)
LDL Cholesterol (Calc): 64 mg/dL (calc)
Non-HDL Cholesterol (Calc): 75 mg/dL (calc) (ref <130)
Total CHOL/HDL Ratio: 1.9 (calc) (ref <5.0)
Triglycerides: 39 mg/dL (ref <150)

## 2021-10-07 LAB — MICROSCOPIC MESSAGE

## 2021-10-07 LAB — VITAMIN D 25 HYDROXY (VIT D DEFICIENCY, FRACTURES): Vit D, 25-Hydroxy: 70 ng/mL (ref 30–100)

## 2021-10-07 LAB — MICROALBUMIN / CREATININE URINE RATIO
Creatinine, Urine: 82 mg/dL (ref 20–275)
Microalb Creat Ratio: 2 mcg/mg creat (ref <30)
Microalb, Ur: 0.2 mg/dL

## 2021-10-07 LAB — TSH: TSH: 3.38 mIU/L (ref 0.40–4.50)

## 2021-10-07 LAB — MAGNESIUM: Magnesium: 2.2 mg/dL (ref 1.5–2.5)

## 2021-10-28 ENCOUNTER — Encounter: Payer: Self-pay | Admitting: Nurse Practitioner

## 2021-10-29 ENCOUNTER — Other Ambulatory Visit: Payer: Self-pay | Admitting: Adult Health

## 2021-10-29 MED ORDER — ROSUVASTATIN CALCIUM 10 MG PO TABS: ORAL_TABLET | ORAL | 1 refills | Status: DC

## 2021-10-31 ENCOUNTER — Encounter: Payer: Self-pay | Admitting: Internal Medicine

## 2021-12-22 DIAGNOSIS — H25813 Combined forms of age-related cataract, bilateral: Secondary | ICD-10-CM | POA: Diagnosis not present

## 2021-12-22 DIAGNOSIS — H5703 Miosis: Secondary | ICD-10-CM | POA: Diagnosis not present

## 2021-12-22 DIAGNOSIS — H04123 Dry eye syndrome of bilateral lacrimal glands: Secondary | ICD-10-CM | POA: Diagnosis not present

## 2021-12-24 DIAGNOSIS — Z0100 Encounter for examination of eyes and vision without abnormal findings: Secondary | ICD-10-CM | POA: Diagnosis not present

## 2022-01-01 ENCOUNTER — Other Ambulatory Visit: Payer: Self-pay | Admitting: Adult Health

## 2022-01-03 DIAGNOSIS — Z20822 Contact with and (suspected) exposure to covid-19: Secondary | ICD-10-CM | POA: Diagnosis not present

## 2022-01-26 ENCOUNTER — Emergency Department (HOSPITAL_BASED_OUTPATIENT_CLINIC_OR_DEPARTMENT_OTHER): Payer: Medicare HMO

## 2022-01-26 ENCOUNTER — Other Ambulatory Visit: Payer: Self-pay

## 2022-01-26 ENCOUNTER — Emergency Department (HOSPITAL_BASED_OUTPATIENT_CLINIC_OR_DEPARTMENT_OTHER)
Admission: EM | Admit: 2022-01-26 | Discharge: 2022-01-26 | Disposition: A | Discharge status: Home / Self Care | Payer: Medicare HMO | Attending: Emergency Medicine | Admitting: Emergency Medicine

## 2022-01-26 DIAGNOSIS — R519 Headache, unspecified: Secondary | ICD-10-CM | POA: Diagnosis not present

## 2022-01-26 DIAGNOSIS — E039 Hypothyroidism, unspecified: Secondary | ICD-10-CM | POA: Insufficient documentation

## 2022-01-26 DIAGNOSIS — R112 Nausea with vomiting, unspecified: Secondary | ICD-10-CM | POA: Diagnosis not present

## 2022-01-26 DIAGNOSIS — I10 Essential (primary) hypertension: Secondary | ICD-10-CM | POA: Insufficient documentation

## 2022-01-26 DIAGNOSIS — Z79899 Other long term (current) drug therapy: Secondary | ICD-10-CM | POA: Insufficient documentation

## 2022-01-26 LAB — CBC WITH DIFFERENTIAL/PLATELET
Abs Immature Granulocytes: 0.02 10*3/uL (ref 0.00–0.07)
Basophils Absolute: 0 10*3/uL (ref 0.0–0.1)
Basophils Relative: 0 %
Eosinophils Absolute: 0 10*3/uL (ref 0.0–0.5)
Eosinophils Relative: 0 %
HCT: 37.3 % (ref 36.0–46.0)
Hemoglobin: 12.2 g/dL (ref 12.0–15.0)
Immature Granulocytes: 0 %
Lymphocytes Relative: 6 %
Lymphs Abs: 0.4 10*3/uL — ABNORMAL LOW (ref 0.7–4.0)
MCH: 31.9 pg (ref 26.0–34.0)
MCHC: 32.7 g/dL (ref 30.0–36.0)
MCV: 97.6 fL (ref 80.0–100.0)
Monocytes Absolute: 0.3 10*3/uL (ref 0.1–1.0)
Monocytes Relative: 4 %
Neutro Abs: 6.3 10*3/uL (ref 1.7–7.7)
Neutrophils Relative %: 90 %
Platelets: 150 10*3/uL (ref 150–400)
RBC: 3.82 MIL/uL — ABNORMAL LOW (ref 3.87–5.11)
RDW: 12.3 % (ref 11.5–15.5)
WBC: 7 10*3/uL (ref 4.0–10.5)
nRBC: 0 % (ref 0.0–0.2)

## 2022-01-26 LAB — COMPREHENSIVE METABOLIC PANEL
ALT: 13 U/L (ref 0–44)
AST: 21 U/L (ref 15–41)
Albumin: 4 g/dL (ref 3.5–5.0)
Alkaline Phosphatase: 47 U/L (ref 38–126)
Anion gap: 11 (ref 5–15)
BUN: 9 mg/dL (ref 8–23)
CO2: 23 mmol/L (ref 22–32)
Calcium: 9.2 mg/dL (ref 8.9–10.3)
Chloride: 102 mmol/L (ref 98–111)
Creatinine, Ser: 0.77 mg/dL (ref 0.44–1.00)
GFR, Estimated: >60 mL/min (ref >60)
Glucose, Bld: 120 mg/dL — ABNORMAL HIGH (ref 70–99)
Potassium: 4.1 mmol/L (ref 3.5–5.1)
Sodium: 136 mmol/L (ref 135–145)
Total Bilirubin: 0.6 mg/dL (ref 0.3–1.2)
Total Protein: 7.3 g/dL (ref 6.5–8.1)

## 2022-01-26 LAB — LIPASE, BLOOD: Lipase: 16 U/L (ref 11–51)

## 2022-01-26 MED ORDER — ONDANSETRON 8 MG PO TBDP: ORAL_TABLET | ORAL | 0 refills | Status: DC

## 2022-01-26 MED ORDER — ONDANSETRON HCL 4 MG/2ML IJ SOLN
4.0000 mg | Freq: Once | INTRAMUSCULAR | Status: AC
  Administered 2022-01-26: 4 mg via INTRAVENOUS
  Filled 2022-01-26: qty 2

## 2022-01-26 MED ORDER — SODIUM CHLORIDE 0.9 % IV BOLUS
1000.0000 mL | Freq: Once | INTRAVENOUS | Status: AC
  Administered 2022-01-26: 1000 mL via INTRAVENOUS

## 2022-01-26 MED ORDER — KETOROLAC TROMETHAMINE 30 MG/ML IJ SOLN
30.0000 mg | Freq: Once | INTRAMUSCULAR | Status: AC
  Administered 2022-01-26: 30 mg via INTRAVENOUS
  Filled 2022-01-26: qty 1

## 2022-01-26 NOTE — Discharge Instructions (Signed)
Begin taking Zofran as prescribed as needed for nausea.  Take ibuprofen 600 mg every 6 hours as prescribed as needed for headache.  Clear liquid diet for the next 12 hours, then slowly advance to normal as tolerated.  Return to the emergency department if symptoms significantly worsen or change.

## 2022-01-26 NOTE — ED Triage Notes (Addendum)
Pt c/o persistent headache across the front forehead since yesterday around 2-4 pm, currently rating pain 6 out of 10. Headache associated bilateral jaw pain, breast itchiness, feeling flushed, and NV. Pt A&Ox4. No neuro deficit. No history of migraines. No fevers/chills., no neck pain, no photosensitivity/noise sensitivity. No recent injuries/trauma.

## 2022-01-26 NOTE — ED Provider Notes (Signed)
[facility] DE[facility] Provider Note   CSN: [id number] Arrival date & time: 01/26/22  0505     History  Chief Complaint  Patient presents with   Headache    Tracy Brady is a 66 y.o. female.  Patient is a 66 year old female with past medical history of hypertension, hypothyroidism, hyperlipidemia.  Patient presenting today for evaluation of headache.  Patient returned home from church yesterday afternoon and developed headache along with nausea and multiple episodes of vomiting.  She reports being unable to keep anything down since yesterday afternoon.  She denies any visual disturbances, numbness, or tingling.  She denies to me she is having any abdominal pain, diarrhea, or fevers.  She denies any ill contacts or having consumed any suspicious foods.  She has no history of migraines.  Patient takes no blood thinners.  The history is provided by the patient.      Home Medications Prior to Admission medications   Medication Sig Start Date End Date Taking? Authorizing Provider  cetirizine (ZYRTEC) 10 MG tablet Take 10 mg by mouth daily.    [provider]  Cholecalciferol (VITAMIN D3) 125 MCG (5000 UT) CAPS Take by mouth daily.    [provider]  levothyroxine (SYNTHROID) 25 MCG tablet TAKE 1-2 TABLET DAILY AS DIRECTED ON AN EMPTY STOMACH WITH ONLY WATER FOR 30 MINUTES & NO ANTACID MEDS, CALCIUM OR MAGNESIUM FOR 4 HOURS & AVOID BIOTIN 01/01/22   Liane Comber, NP  rosuvastatin (CRESTOR) 10 MG tablet TAKE 1 TABLET BY MOUTH EVERY DAY FOR CHOLESTEROL 10/29/21   Liane Comber, NP      Allergies    Patient has no known allergies.    Review of Systems   Review of Systems  All other systems reviewed and are negative.  Physical Exam Updated Vital Signs BP (!) 149/89 (BP Location: Right Arm)    Pulse 91    Temp 98.4 F (36.9 C) (Oral)    Resp 20    Ht 5\' 4"  (1.626 m)    Wt 68 kg    SpO2 99%    BMI 25.75 kg/m  Physical Exam Vitals and nursing  note reviewed.  Constitutional:      General: She is not in acute distress.    Appearance: She is well-developed. She is not diaphoretic.  HENT:     Head: Normocephalic and atraumatic.  Cardiovascular:     Rate and Rhythm: Normal rate and regular rhythm.     Heart sounds: No murmur heard.   No friction rub. No gallop.  Pulmonary:     Effort: Pulmonary effort is normal. No respiratory distress.     Breath sounds: Normal breath sounds. No wheezing.  Abdominal:     General: Bowel sounds are normal. There is no distension.     Palpations: Abdomen is soft.     Tenderness: There is no abdominal tenderness.  Musculoskeletal:        General: Normal range of motion.     Cervical back: Normal range of motion and neck supple.  Skin:    General: Skin is warm and dry.  Neurological:     General: No focal deficit present.     Mental Status: She is alert and oriented to person, place, and time.     Cranial Nerves: No cranial nerve deficit or facial asymmetry.     Sensory: No sensory deficit.     Motor: No weakness.     Coordination: Coordination normal.    ED Results /  Procedures / Treatments   Labs (all labs ordered are listed, but only abnormal results are displayed) Labs Reviewed  COMPREHENSIVE METABOLIC PANEL  CBC WITH DIFFERENTIAL/PLATELET  LIPASE, BLOOD    EKG None  Radiology No results found.  Procedures Procedures    Medications Ordered in ED Medications  sodium chloride 0.9 % bolus 1,000 mL (has no administration in time range)  ondansetron (ZOFRAN) injection 4 mg (has no administration in time range)    ED Course/ Medical Decision Making/ A&P  Patient is a 66 year old female presenting with complaints of headache and sudden onset nausea, vomiting, and inability to tolerate p.o.  This started yesterday afternoon.  Patient's abdominal exam and neurologic exam are nonfocal.  Work-up initiated including laboratory studies and CT scan of the head, both of which were  unremarkable.  Patient feeling better after receiving Toradol, Zofran, and IV fluids.  I feel as though she can safely be discharged with Zofran and as needed return.  Final Clinical Impression(s) / ED Diagnoses Final diagnoses:  None    Rx / DC Orders ED Discharge Orders     None         Veryl Speak, MD 01/26/22 [phone]

## 2022-01-26 NOTE — ED Notes (Signed)
Discharge instructions including pain management and prescription discussed with pt. Pt verbalized understanding with no questions at this time. Pt to go home with s/o at bedside.

## 2022-01-26 NOTE — ED Notes (Signed)
Patient transported to CT 

## 2022-04-22 ENCOUNTER — Other Ambulatory Visit: Payer: Self-pay | Admitting: Adult Health

## 2022-04-23 ENCOUNTER — Encounter: Payer: Self-pay | Admitting: Adult Health

## 2022-04-23 ENCOUNTER — Ambulatory Visit (INDEPENDENT_AMBULATORY_CARE_PROVIDER_SITE_OTHER): Payer: Medicare HMO | Admitting: Adult Health

## 2022-04-23 VITALS — BP 111/77 | HR 85 | Temp 96.9°F | Wt 147.6 lb

## 2022-04-23 DIAGNOSIS — E038 Other specified hypothyroidism: Secondary | ICD-10-CM

## 2022-04-23 DIAGNOSIS — Z85828 Personal history of other malignant neoplasm of skin: Secondary | ICD-10-CM

## 2022-04-23 DIAGNOSIS — E785 Hyperlipidemia, unspecified: Secondary | ICD-10-CM

## 2022-04-23 DIAGNOSIS — E559 Vitamin D deficiency, unspecified: Secondary | ICD-10-CM

## 2022-04-23 DIAGNOSIS — M8580 Other specified disorders of bone density and structure, unspecified site: Secondary | ICD-10-CM | POA: Diagnosis not present

## 2022-04-23 DIAGNOSIS — Z86010 Personal history of colonic polyps: Secondary | ICD-10-CM | POA: Diagnosis not present

## 2022-04-23 DIAGNOSIS — Z6825 Body mass index (BMI) 25.0-25.9, adult: Secondary | ICD-10-CM | POA: Diagnosis not present

## 2022-04-23 DIAGNOSIS — Z0001 Encounter for general adult medical examination with abnormal findings: Secondary | ICD-10-CM

## 2022-04-23 DIAGNOSIS — R7309 Other abnormal glucose: Secondary | ICD-10-CM | POA: Diagnosis not present

## 2022-04-23 DIAGNOSIS — I10 Essential (primary) hypertension: Secondary | ICD-10-CM

## 2022-04-23 DIAGNOSIS — E063 Autoimmune thyroiditis: Secondary | ICD-10-CM

## 2022-04-23 DIAGNOSIS — R6889 Other general symptoms and signs: Secondary | ICD-10-CM

## 2022-04-23 DIAGNOSIS — Z0000 Encounter for general adult medical examination without abnormal findings: Secondary | ICD-10-CM

## 2022-04-23 DIAGNOSIS — J309 Allergic rhinitis, unspecified: Secondary | ICD-10-CM

## 2022-04-23 NOTE — Progress Notes (Signed)
MEDICARE WELLNESS   Assessment and Plan:  Annual Medicare Wellness Visit Due annually  Health maintenance reviewed  Hyperlipidemia, unspecified hyperlipidemia type Continue rosuvastatin; consider dose reduction if LDL trending down further, has been working on lifestyle/added fiber supplement  decrease fatty foods - saturated fat increase activity.  -     Lipid panel  Essential hypertension -     Recently at goal by lifestyle/off of medications -     DASH diet, exercise and monitor at home. Call if greater than 130/80.  -     CBC with Differential/Platelet -     COMPLETE METABOLIC PANEL WITH GFR  Hypothyroidism due to Hashimoto's thyroiditis -check TSH level, continue medications the same, reminded to take on an empty stomach 30-29mns before food.  - if TSH remains at goal send in new dose 50 mcg daily  -     TSH  Abnormal glucose Recent A1Cs at goal Discussed diet/exercise, weight management  Defer A1C; check CMP  Vitamin D deficiency Continue supplement; at goal last visit, defer check to CPE  Basal cell carcinoma (BCC), hx of  Monitor, derm follows  Allergic rhinitis, unspecified seasonality, unspecified trigger - H2i OTC, advised to rotate agent q4-6 months if taking year round;  Sx well controlled  History of colon polyps High fiber diet encouraged; next colonoscopy due 2032  Osteopenia GYN follows; Next DEXA due later fall 2023 continue Vit D and Ca, weight bearing exercises encouraged  BMI 25 Continue to recommend diet heavy in fruits and veggies and low in animal meats, cheeses, and dairy products, appropriate calorie intake Discuss exercise recommendations routinely Continue to monitor weight at each visit  Orders Placed This Encounter  Procedures   CBC with Differential/Platelet   COMPLETE METABOLIC PANEL WITH GFR   Lipid panel   TSH      Discussed med's effects and SE's. Screening labs and tests as requested with regular follow-up as  recommended. Future Appointments  Date Time Provider [place] 10/06/2022 10:00 AM MMagda Bernheim NP [facility] None    Plan:   During the course of the visit the patient was educated and counseled about appropriate screening and preventive services including:   Pneumococcal vaccine  Prevnar 13 Influenza vaccine Td vaccine Screening electrocardiogram Bone densitometry screening Colorectal cancer screening Diabetes screening Glaucoma screening Nutrition counseling  Advanced directives: requested   HPI  66y.o. female  presents for a MEDICARE WELLNESS AND FOLLOW UP. She has Hyperlipidemia; Vitamin D deficiency; Allergic rhinitis; History of basal cell carcinoma; HTN (hypertension); Hypothyroidism; Abnormal glucose (hx of prediabetes); and History of colon polyps on their problem list.  History of BCC, lip and R eyelid, Dr. JRonnald Rampfollows PRN, plans to follow up this year.   BMI is Body mass index is 25.34 kg/m., she has been working on diet, has been watching portions (weight watchers). Admits to minimal exercise, not very motivated, but thinks will restart walking at the park, planning on line dancing with granddaughter this weekend.   Wt Readings from Last 3 Encounters:  04/23/22 147 lb 9.6 oz (67 kg)  01/26/22 150 lb (68 kg)  10/06/21 158 lb 3.2 oz (71.8 kg)   Her blood pressure has been controlled at home, today their BP is BP: 111/77.   She does not workout. She denies chest pain, shortness of breath, dizziness.    She is on cholesterol medication, crestor 10 mg daily and denies myalgias. Her cholesterol is at goal. The cholesterol last visit was:  Lab  Results  Component Value Date   CHOL 154 10/06/2021   HDL 79 10/06/2021   LDLCALC 64 10/06/2021   TRIG 39 10/06/2021   CHOLHDL 1.9 10/06/2021  . She has been working on diet and exercise for glucose management, hx of prediabetes (A1C 5.7% in 2017). Last A1C in the office was:  Lab Results  Component Value Date    HGBA1C 5.2 10/06/2021   She is on thyroid medication. Her medication was changed last visit.  She is now taking 50 mcg daily. Takes first in AM on empty stomach.  Lab Results  Component Value Date   TSH 3.38 10/06/2021   Previously CKD III range, recently improved to normal with increased fluid intake. Last GFR:  Lab Results  Component Value Date   EGFR 76 10/06/2021   Patient is on Vitamin D supplement, 1500 IU a day.   Lab Results  Component Value Date   VD25OH 45 10/06/2021        Current Medications:   Current Outpatient Medications (Endocrine & Metabolic):    levothyroxine (SYNTHROID) 25 MCG tablet, TAKE 1-2 TABLET DAILY AS DIRECTED ON AN EMPTY STOMACH WITH ONLY WATER FOR 30 MINUTES & NO ANTACID MEDS, CALCIUM OR MAGNESIUM FOR 4 HOURS & AVOID BIOTIN  Current Outpatient Medications (Cardiovascular):    rosuvastatin (CRESTOR) 10 MG tablet, TAKE 1 TABLET BY MOUTH EVERY DAY FOR CHOLESTEROL  Current Outpatient Medications (Respiratory):    cetirizine (ZYRTEC) 10 MG tablet, Take 10 mg by mouth daily.    Current Outpatient Medications (Other):    Cholecalciferol (VITAMIN D3) 125 MCG (5000 UT) CAPS, Take by mouth daily.   ondansetron (ZOFRAN-ODT) 8 MG disintegrating tablet, 35m ODT q4 hours prn nausea  Health Maintenance:   Immunization History  Administered Date(s) Administered   Influenza Inj Mdck Quad With Preservative 09/06/2017, 09/12/2018   Influenza Whole 09/02/2013   Influenza, High Dose Seasonal PF 08/31/2019, 10/03/2020, 08/27/2021   Influenza, Seasonal, Injecte, Preservative Fre 11/08/2015   Influenza-Unspecified 07/29/2016   Moderna SARS-COV2 Booster Vaccination 08/27/2021   PFIZER(Purple Top)SARS-COV-2 Vaccination 01/02/2020, 02/01/2020   PPD Test 04/24/2014   Pneumococcal Conjugate-13 05/30/2019   Pneumococcal Polysaccharide-23 04/13/2013   Tdap 10/16/2008, 09/06/2017   Zoster Recombinat (Shingrix) 08/31/2019, 02/21/2020   Health Maintenance  Topic  Date Due   COVID-19 Vaccine (3 - Pfizer risk series) 05/09/2022 (Originally 09/24/2021)   INFLUENZA VACCINE  06/30/2022   DEXA SCAN  08/21/2022   MAMMOGRAM  08/27/2022   TETANUS/TDAP  09/07/2027   COLONOSCOPY (Pts 45-475yrInsurance coverage will need to be confirmed)  02/26/2031   Hepatitis C Screening  Completed   Zoster Vaccines- Shingrix  Completed   HPV VACCINES  Aged Out   Pneumonia Vaccine [age]Years old  Discontinued    Pap: Jan 2021, Dr. RiMarvel Planollows [facility] [facility] [facility] [facility] PlanColonoscopy: 01/2021, benign polyps, Dr. PeHenrene Pastorrepeat 10 years  Last Dental Exam: Dr. PoTrenton Gammon2023, q6m40mst Eye Exam: Dr. GleEulas Postast 06/2022, mild L catact, monitoring  Patient Care Team: McKUnk PintoD as PCP - General (Internal Medicine) PerIrene ShipperD as Consulting Physician (Gastroenterology) JonDanella SensingD as Consulting Physician (Dermatology) NewRozetta NunneryD (Inactive) as Consulting Physician (Otolaryngology) RicPaula ComptonD as Consulting Physician (Obstetrics and Gynecology)  Medical History:  Past Medical History:  Diagnosis Date   Allergic rhinitis    Allergy    seasonal allergies   Basal cell carcinoma  Cataract    LEFT-not a surgical candidate at this time (02/11/2021)   Hyperlipidemia    on meds   Hypothyroidism    PONV (postoperative nausea and vomiting)    Thyroid disease    on meds   Vitamin D deficiency    Allergies No Known Allergies  SURGICAL HISTORY She  has a past surgical history that includes Tympanoplasty (Left); Tympanoplasty (Right, 06/07/2020); Wisdom tooth extraction; Colonoscopy (2011); and Colonoscopy (02/25/2021). FAMILY HISTORY Her family history includes Cancer in her maternal uncle; Cancer (age of onset: 86) in her sister; Heart attack in her mother; Heart disease in her mother; Hyperlipidemia in her mother; Hypertension in her mother; Varicose Veins in  her sister. SOCIAL HISTORY She  reports that she has never smoked. She has never used smokeless tobacco. She reports that she does not drink alcohol and does not use drugs.  MEDICARE WELLNESS OBJECTIVES: Physical activity: Current Exercise Habits: The patient does not participate in regular exercise at present, Exercise limited by: None identified Cardiac risk factors: Cardiac Risk Factors include: advanced age (>26mn, >>70women);dyslipidemia;hypertension;sedentary lifestyle Depression/mood screen:      04/23/2022    3:48 PM  Depression screen PHQ 2/9  Decreased Interest 0  Down, Depressed, Hopeless 0  PHQ - 2 Score 0    ADLs:     04/23/2022    3:45 PM  In your present state of health, do you have any difficulty performing the following activities:  Hearing? 0  Vision? 0  Difficulty concentrating or making decisions? 0  Walking or climbing stairs? 0  Dressing or bathing? 0  Doing errands, shopping? 0     Cognitive Testing  Alert? Yes  Normal Appearance?Yes  Oriented to person? Yes  Place? Yes   Time? Yes  Recall of three objects?  Yes  Can perform simple calculations? Yes  Displays appropriate judgment?Yes  Can read the correct time from a watch face?Yes  EOL planning: Does Patient Have a Medical Advance Directive?: No Would patient like information on creating a medical advance directive?: No - Patient declined   Review of Systems: Review of Systems  Constitutional:  Negative for malaise/fatigue and weight loss.  HENT:  Negative for hearing loss and tinnitus.   Eyes:  Negative for blurred vision and double vision.  Respiratory:  Negative for cough, sputum production, shortness of breath and wheezing.   Cardiovascular:  Negative for chest pain, palpitations, orthopnea, claudication, leg swelling and PND.  Gastrointestinal:  Negative for abdominal pain, blood in stool, constipation, diarrhea, heartburn, melena, nausea and vomiting.  Genitourinary: Negative.    Musculoskeletal:  Negative for falls, joint pain and myalgias.  Skin:  Negative for rash.  Neurological:  Negative for dizziness, tingling, sensory change, weakness and headaches.  Endo/Heme/Allergies:  Negative for polydipsia.  Psychiatric/Behavioral: Negative.  Negative for depression, memory loss, substance abuse and suicidal ideas. The patient is not nervous/anxious and does not have insomnia.   All other systems reviewed and are negative.  Physical Exam: Estimated body mass index is 25.34 kg/m as calculated from the following:   Height as of 01/26/22: 5' 4"  (1.626 m).   Weight as of this encounter: 147 lb 9.6 oz (67 kg). BP 111/77   Pulse 85   Temp (!) 96.9 F (36.1 C)   Wt 147 lb 9.6 oz (67 kg)   SpO2 97%   BMI 25.34 kg/m   General Appearance: Well nourished well developed, in no apparent distress.  Eyes: PERRLA, EOMs, conjunctiva no swelling or erythema  ENT/Mouth: Ear canals clear, TMs normal bilaterally with no erythema, bulging, retraction, or loss of landmark.  Oropharynx moist and clear with no exudate, erythema, or swelling.   Neck: Supple, thyroid normal. No bruits.  No cervical adenopathy Respiratory: Respiratory effort normal, Breath sounds clear A&P without wheeze, rhonchi, rales.   Cardio: RRR without murmurs, rubs or gallops. Brisk peripheral pulses without edema.  Abdomen: Soft, nontender, no guarding, rebound, hernias, masses, or organomegaly.  Lymphatics: Non tender without lymphadenopathy.  Musculoskeletal: Full ROM all peripheral extremities,5/5 strength, and normal gait. Skin: Warm, dry without rashes, lesions, ecchymosis. Neuro: Awake and oriented X 3, Cranial nerves intact, reflexes equal bilaterally. Normal muscle tone, no cerebellar symptoms. Sensation intact.  Psych:  normal affect, Insight and Judgment appropriate.    Medicare Attestation I have personally reviewed: The patient's medical and social history Their use of alcohol, tobacco or illicit  drugs Their current medications and supplements The patient's functional ability including ADLs,fall risks, home safety risks, cognitive, and hearing and visual impairment Diet and physical activities Evidence for depression or mood disorders  The patient's weight, height, BMI, and visual acuity have been recorded in the chart.  I have made referrals, counseling, and provided education to the patient based on review of the above and I have provided the patient with a written personalized care plan for preventive services.     Izora Ribas, DNP, AGNP-C 4:02 PM [facility]

## 2022-04-23 NOTE — Patient Instructions (Signed)
  Tracy Brady , Thank you for taking time to come for your Medicare Wellness Visit. I appreciate your ongoing commitment to your health goals. Please review the following plan we discussed and let me know if I can assist you in the future.   These are the goals we discussed:  Goals      150 min weekly exercise        This is a list of the screening recommended for you and due dates:  Health Maintenance  Topic Date Due   Pneumonia Vaccine (3) 05/29/2020   COVID-19 Vaccine (3 - Pfizer risk series) 09/24/2021   Flu Shot  06/30/2022   DEXA scan (bone density measurement)  08/21/2022   Mammogram  08/27/2022   Tetanus Vaccine  09/07/2027   Colon Cancer Screening  02/26/2031   Hepatitis C Screening: USPSTF Recommendation to screen - Ages 18-79 yo.  Completed   Zoster (Shingles) Vaccine  Completed   HPV Vaccine  Aged Out

## 2022-04-24 LAB — COMPLETE METABOLIC PANEL WITH GFR
AG Ratio: 1.4 (calc) (ref 1.0–2.5)
ALT: 12 U/L (ref 6–29)
AST: 20 U/L (ref 10–35)
Albumin: 3.9 g/dL (ref 3.6–5.1)
Alkaline phosphatase (APISO): 59 U/L (ref 37–153)
BUN: 16 mg/dL (ref 7–25)
CO2: 27 mmol/L (ref 20–32)
Calcium: 9 mg/dL (ref 8.6–10.4)
Chloride: 105 mmol/L (ref 98–110)
Creat: 0.8 mg/dL (ref 0.50–1.05)
Globulin: 2.8 g/dL (calc) (ref 1.9–3.7)
Glucose, Bld: 84 mg/dL (ref 65–99)
Potassium: 4.1 mmol/L (ref 3.5–5.3)
Sodium: 141 mmol/L (ref 135–146)
Total Bilirubin: 0.4 mg/dL (ref 0.2–1.2)
Total Protein: 6.7 g/dL (ref 6.1–8.1)
eGFR: 80 mL/min/{1.73_m2} (ref >=60)

## 2022-04-24 LAB — CBC WITH DIFFERENTIAL/PLATELET
Absolute Monocytes: 433 cells/uL (ref 200–950)
Basophils Absolute: 73 cells/uL (ref 0–200)
Basophils Relative: 1.2 %
Eosinophils Absolute: 250 cells/uL (ref 15–500)
Eosinophils Relative: 4.1 %
HCT: 35.7 % (ref 35.0–45.0)
Hemoglobin: 12 g/dL (ref 11.7–15.5)
Lymphs Abs: 1537 cells/uL (ref 850–3900)
MCH: 32.9 pg (ref 27.0–33.0)
MCHC: 33.6 g/dL (ref 32.0–36.0)
MCV: 97.8 fL (ref 80.0–100.0)
MPV: 11.1 fL (ref 7.5–12.5)
Monocytes Relative: 7.1 %
Neutro Abs: 3806 cells/uL (ref 1500–7800)
Neutrophils Relative %: 62.4 %
Platelets: 160 10*3/uL (ref 140–400)
RBC: 3.65 10*6/uL — ABNORMAL LOW (ref 3.80–5.10)
RDW: 11.4 % (ref 11.0–15.0)
Total Lymphocyte: 25.2 %
WBC: 6.1 10*3/uL (ref 3.8–10.8)

## 2022-04-24 LAB — LIPID PANEL
Cholesterol: 145 mg/dL (ref <200)
HDL: 70 mg/dL (ref >=50)
LDL Cholesterol (Calc): 62 mg/dL (calc)
Non-HDL Cholesterol (Calc): 75 mg/dL (calc) (ref <130)
Total CHOL/HDL Ratio: 2.1 (calc) (ref <5.0)
Triglycerides: 52 mg/dL (ref <150)

## 2022-04-24 LAB — TSH: TSH: 2.31 mIU/L (ref 0.40–4.50)

## 2022-08-27 DIAGNOSIS — M25572 Pain in left ankle and joints of left foot: Secondary | ICD-10-CM | POA: Diagnosis not present

## 2022-09-02 DIAGNOSIS — Z1231 Encounter for screening mammogram for malignant neoplasm of breast: Secondary | ICD-10-CM | POA: Diagnosis not present

## 2022-09-02 LAB — HM MAMMOGRAPHY

## 2022-09-03 ENCOUNTER — Encounter: Payer: Self-pay | Admitting: Internal Medicine

## 2022-10-05 NOTE — Progress Notes (Unsigned)
COMPLETE PHYSICAL   Assessment and Plan:  Encounter for routine medical examination w/ abnormal findings Yearly  Hyperlipidemia, unspecified hyperlipidemia type Continue Rosuvastatin '10mg'$  daily -     Lipid panel check lipids decrease fatty foods increase activity.   Essential hypertension Controlled with lifestyle Monitor blood pressure at home; call if consistently over 130/80 Continue DASH diet.   Reminder to go to the ER if any CP, SOB, nausea, dizziness, severe HA, changes vision/speech, left arm numbness and tingling and jaw pain. -     CBC with Differential/Platelet -     COMPLETE METABOLIC PANEL WITH GFR -     Urinalysis, Routine w reflex microscopic -     Microalbumin / creatinine urine ratio -     EKG 12-Lead -  DASH diet, exercise and monitor at home. Call if greater than 130/80.   Hypothyroidism due to Hashimoto's thyroiditis Taking levothyroxine 25 mcg 2 tabs daily Reminder to take on an empty stomach 30-22mns before first meal of the day. No antacid medications for 4 hours. -     TSH  Osteopenia, left neck femur DEXA UTD, monitored by GYN  Abnormal glucose With better eating and purposeful weight loss  A1c  BMI 28 Continue diet and exercise Stressed importance of fresh fruits and vegetables, fiber, water  Vitamin D deficiency Continue supplementation to maintain goal of 70-100 Taking Vitamin D 5,000 IU daily -     VITAMIN D 25 Hydroxy (Vit-D Deficiency, Fractures)  Allergic rhinitis, unspecified seasonality, unspecified trigger - Allegra OTC, increase H20, allergy hygiene explained.  Stage 3 chronic kidney disease, unspecified whether stage 3a or 3b CKD Increase fluids  Avoid NSAIDS Blood pressure control Monitor sugars  Will continue to monitor  Medication management Magnesium  Screening for ischemic heart disease EKG   Further disposition pending results if labs check today. Discussed med's effects and SE's.   Over 40 minutes of  face to face interview, exam, counseling, chart review, and critical decision making was performed.    Future Appointments  Date Time Provider [place] 10/06/2022 10:00 AM WAlycia Rossetti NP [facility] None  04/28/2023  3:30 PM CDarrol Jump NP [facility] None     HPI  66y.o. female  presents for a Complete Physical. has Hyperlipidemia; Vitamin D deficiency; Allergic rhinitis; History of basal cell carcinoma; HTN (hypertension); Hypothyroidism; Abnormal glucose (hx of prediabetes); and History of colon polyps on their problem list.   She has been using Metamucil daily for constipation and it is well controlled with this regimen.  We changed her thyroid last visit, she is on 25 mcg 2 tabs daily. If next lab is normal will send in a script for 50 mcg QD  Lab Results  Component Value Date   TSH 2.31 04/23/2022   BMI is There is no height or weight on file to calculate BMI., she has been working on diet and exercise. Wt Readings from Last 3 Encounters:  04/23/22 147 lb 9.6 oz (67 kg)  01/26/22 150 lb (68 kg)  10/06/21 158 lb 3.2 oz (71.8 kg)    Her blood pressure has been controlled at home, today their BP is  .  She does workout, 1 hour daily walking. She denies chest pain, shortness of breath, dizziness.    She is on cholesterol medication, crestor '10mg'$  and denies myalgias. Her cholesterol is at goal. The cholesterol last visit was:  Lab Results  Component Value Date   CHOL 145 04/23/2022   HDL 70 04/23/2022  LDLCALC 62 04/23/2022   TRIG 52 04/23/2022   CHOLHDL 2.1 04/23/2022  . She has been working on diet and exercise for prediabetes, . Last A1C in the office was:  Lab Results  Component Value Date   HGBA1C 5.2 10/06/2021   Patient is on Vitamin D supplement, 1500 IU a day.   Lab Results  Component Value Date   VD25OH 59 10/06/2021      She still sees Dr. Marvel Plan her Obgyn once yearly. Had mammogram at [facility] last month will sign for  results.  Current Medications:   Current Outpatient Medications (Endocrine & Metabolic):    levothyroxine (SYNTHROID) 25 MCG tablet, TAKE 1-2 TABLET DAILY AS DIRECTED ON AN EMPTY STOMACH WITH ONLY WATER FOR 30 MINUTES & NO ANTACID MEDS, CALCIUM OR MAGNESIUM FOR 4 HOURS & AVOID BIOTIN  Current Outpatient Medications (Cardiovascular):    rosuvastatin (CRESTOR) 10 MG tablet, TAKE 1 TABLET BY MOUTH EVERY DAY FOR CHOLESTEROL  Current Outpatient Medications (Respiratory):    cetirizine (ZYRTEC) 10 MG tablet, Take 10 mg by mouth daily.    Current Outpatient Medications (Other):    Cholecalciferol (VITAMIN D3) 125 MCG (5000 UT) CAPS, Take by mouth daily.   ondansetron (ZOFRAN-ODT) 8 MG disintegrating tablet, '8mg'$  ODT q4 hours prn nausea  Health Maintenance:   Immunization History  Administered Date(s) Administered   Influenza Inj Mdck Quad With Preservative 09/06/2017, 09/12/2018   Influenza Whole 09/02/2013   Influenza, High Dose Seasonal PF 08/31/2019, 10/03/2020, 08/27/2021   Influenza, Seasonal, Injecte, Preservative Fre 11/08/2015   Influenza-Unspecified 07/29/2016   Moderna SARS-COV2 Booster Vaccination 08/27/2021   PFIZER(Purple Top)SARS-COV-2 Vaccination 01/02/2020, 02/01/2020   PPD Test 04/24/2014   Pneumococcal Conjugate-13 05/30/2019   Pneumococcal Polysaccharide-23 04/13/2013   Tdap 10/16/2008, 09/06/2017   Zoster Recombinat (Shingrix) 08/31/2019, 02/21/2020   Tetanus: 2018 Pneumovax: 2014 Prevnar 13 2020 Flu vaccine: 07/2021 Shingrix 08/2019 COVID completed  Pap: Jan 2021 MGM:10/22 DEXA: 08/2020, osteopenia [facility], Dr Marvel Plan Colonoscopy: 02/25/21 repeat 01/2031 Last Dental Exam: Once yearly, Dr. Trenton Gammon Last Eye Exam: Unknown 2021  Patient Care Team: Unk Pinto, MD as PCP - General (Internal Medicine) Irene Shipper, MD as Consulting Physician (Gastroenterology) Danella Sensing, MD as Consulting Physician (Dermatology) Rozetta Nunnery, MD (Inactive) as  Consulting Physician (Otolaryngology) Paula Compton, MD as Consulting Physician (Obstetrics and Gynecology)  Medical History:  Past Medical History:  Diagnosis Date   Allergic rhinitis    Allergy    seasonal allergies   Basal cell carcinoma    Cataract    LEFT-not a surgical candidate at this time (02/11/2021)   Hyperlipidemia    on meds   Hypothyroidism    PONV (postoperative nausea and vomiting)    Thyroid disease    on meds   Vitamin D deficiency    Allergies No Known Allergies  SURGICAL HISTORY She  has a past surgical history that includes Tympanoplasty (Left); Tympanoplasty (Right, 06/07/2020); Wisdom tooth extraction; Colonoscopy (2011); and Colonoscopy (02/25/2021). FAMILY HISTORY Her family history includes Cancer in her maternal uncle; Cancer (age of onset: 97) in her sister; Heart attack in her mother; Heart disease in her mother; Hyperlipidemia in her mother; Hypertension in her mother; Varicose Veins in her sister. SOCIAL HISTORY She  reports that she has never smoked. She has never used smokeless tobacco. She reports that she does not drink alcohol and does not use drugs.   Review of Systems: Review of Systems  Constitutional:  Negative for chills, fever and malaise/fatigue.  HENT:  Negative for  congestion, ear pain, hearing loss, sinus pain, sore throat and tinnitus.   Eyes: Negative.  Negative for blurred vision and double vision.  Respiratory:  Negative for cough, hemoptysis, sputum production, shortness of breath and wheezing.   Cardiovascular:  Negative for chest pain, palpitations and leg swelling.  Gastrointestinal:  Negative for abdominal pain, blood in stool, constipation, diarrhea, heartburn, melena, nausea and vomiting.  Genitourinary: Negative.  Negative for dysuria and urgency.  Musculoskeletal:  Negative for back pain, falls, joint pain, myalgias and neck pain.  Skin: Negative.  Negative for rash.  Neurological:  Negative for dizziness,  tingling, tremors, sensory change, loss of consciousness, weakness and headaches.  Endo/Heme/Allergies:  Does not bruise/bleed easily.  Psychiatric/Behavioral:  Negative for depression and suicidal ideas. The patient is not nervous/anxious and does not have insomnia.     Physical Exam: Estimated body mass index is 25.34 kg/m as calculated from the following:   Height as of 01/26/22: '5\' 4"'$  (1.626 m).   Weight as of 04/23/22: 147 lb 9.6 oz (67 kg). There were no vitals taken for this visit.  General Appearance: Well nourished well developed, in no apparent distress.  Eyes: PERRLA, EOMs, conjunctiva no swelling or erythema ENT/Mouth: Ear canals normal without obstruction, swelling, erythema, or discharge.  TMs normal bilaterally with no erythema, bulging, retraction, or loss of landmark.  Oropharynx moist and clear with no exudate, erythema, or swelling.   Neck: Supple, thyroid normal. No bruits.  No cervical adenopathy Respiratory: Respiratory effort normal, Breath sounds clear A&P without wheeze, rhonchi, rales.   Cardio: RRR without murmurs, rubs or gallops. Brisk peripheral pulses without edema.  Chest: symmetric, with normal excursions Breasts: Symmetric, without lumps, nipple discharge, retractions.  Abdomen: Soft, nontender, no guarding, rebound, hernias, masses, or organomegaly.  Lymphatics: Non tender without lymphadenopathy.  Musculoskeletal: Full ROM all peripheral extremities,5/5 strength, and normal gait. Skin: Warm, dry without rashes, lesions, ecchymosis. Neuro: Awake and oriented X 3, Cranial nerves intact, reflexes equal bilaterally. Normal muscle tone, no cerebellar symptoms. Sensation intact.  Psych:  normal affect, Insight and Judgment appropriate.   EKG: NSR  [facility]  10/05/2022

## 2022-10-06 ENCOUNTER — Ambulatory Visit (INDEPENDENT_AMBULATORY_CARE_PROVIDER_SITE_OTHER): Payer: Medicare HMO | Admitting: Nurse Practitioner

## 2022-10-06 ENCOUNTER — Encounter: Payer: Self-pay | Admitting: Nurse Practitioner

## 2022-10-06 VITALS — BP 102/70 | HR 46 | Temp 97.7°F | Ht 62.5 in | Wt 155.0 lb

## 2022-10-06 DIAGNOSIS — S93402D Sprain of unspecified ligament of left ankle, subsequent encounter: Secondary | ICD-10-CM

## 2022-10-06 DIAGNOSIS — E559 Vitamin D deficiency, unspecified: Secondary | ICD-10-CM

## 2022-10-06 DIAGNOSIS — I10 Essential (primary) hypertension: Secondary | ICD-10-CM | POA: Diagnosis not present

## 2022-10-06 DIAGNOSIS — R7309 Other abnormal glucose: Secondary | ICD-10-CM

## 2022-10-06 DIAGNOSIS — Z136 Encounter for screening for cardiovascular disorders: Secondary | ICD-10-CM | POA: Diagnosis not present

## 2022-10-06 DIAGNOSIS — E063 Autoimmune thyroiditis: Secondary | ICD-10-CM | POA: Diagnosis not present

## 2022-10-06 DIAGNOSIS — Z0000 Encounter for general adult medical examination without abnormal findings: Secondary | ICD-10-CM | POA: Diagnosis not present

## 2022-10-06 DIAGNOSIS — Z23 Encounter for immunization: Secondary | ICD-10-CM | POA: Diagnosis not present

## 2022-10-06 DIAGNOSIS — Z1389 Encounter for screening for other disorder: Secondary | ICD-10-CM | POA: Diagnosis not present

## 2022-10-06 DIAGNOSIS — E785 Hyperlipidemia, unspecified: Secondary | ICD-10-CM | POA: Diagnosis not present

## 2022-10-06 DIAGNOSIS — E038 Other specified hypothyroidism: Secondary | ICD-10-CM | POA: Diagnosis not present

## 2022-10-06 DIAGNOSIS — M8580 Other specified disorders of bone density and structure, unspecified site: Secondary | ICD-10-CM

## 2022-10-06 DIAGNOSIS — Z0001 Encounter for general adult medical examination with abnormal findings: Secondary | ICD-10-CM

## 2022-10-06 DIAGNOSIS — I700 Atherosclerosis of aorta: Secondary | ICD-10-CM

## 2022-10-06 DIAGNOSIS — Z79899 Other long term (current) drug therapy: Secondary | ICD-10-CM | POA: Diagnosis not present

## 2022-10-06 DIAGNOSIS — N1831 Chronic kidney disease, stage 3a: Secondary | ICD-10-CM | POA: Diagnosis not present

## 2022-10-06 DIAGNOSIS — Z6825 Body mass index (BMI) 25.0-25.9, adult: Secondary | ICD-10-CM

## 2022-10-06 MED ORDER — LEVOTHYROXINE SODIUM 50 MCG PO TABS: 50.0000 ug | ORAL_TABLET | Freq: Every day | ORAL | 3 refills | Status: DC

## 2022-10-06 NOTE — Patient Instructions (Signed)
GENERAL HEALTH GOALS   Know what a healthy weight is for you (roughly BMI <25) and aim to maintain this   Aim for 7+ servings of fruits and vegetables daily   70-80+ fluid ounces of water or unsweet tea for healthy kidneys   Limit to max 1 drink of alcohol per day; avoid smoking/tobacco   Limit animal fats in diet for cholesterol and heart health - choose grass fed whenever available   Avoid highly processed foods, and foods high in saturated/trans fats   Aim for low stress - take time to unwind and care for your mental health   Aim for 150 min of moderate intensity exercise weekly for heart health, and weights twice weekly for bone health   Aim for 7-9 hours of sleep daily   Ankle Sprain- wear elastic brace for support  An ankle sprain is a stretch or tear in one of the tough tissues (ligaments) that connect the bones in your ankle. An ankle sprain can happen when the ankle rolls outward (inversion sprain) or inward (eversion sprain). What are the causes? This condition is caused by rolling or twisting the ankle. What increases the risk? You are more likely to develop this condition if you play sports. What are the signs or symptoms? Symptoms of this condition include: Pain in your ankle. Swelling. Bruising. This may happen right after you sprain your ankle or 1-2 days later. Trouble standing or walking. How is this diagnosed? This condition is diagnosed with: A physical exam. During the exam, your doctor will press on certain parts of your foot and ankle and try to move them in certain ways. X-ray imaging. These may be taken to see how bad the sprain is and to check for broken bones. How is this treated? This condition may be treated with: A brace or splint. This is used to keep the ankle from moving until it heals. An elastic bandage. This is used to support the ankle. Crutches. Pain medicine. Surgery. This may be needed if the sprain is very bad. Physical therapy.  This may help to improve movement in the ankle. Follow these instructions at home: If you have a brace or a splint: Wear the brace or splint as told by your doctor. Remove it only as told by your doctor. Loosen the brace or splint if your toes: Tingle. Lose feeling (become numb). Turn cold and blue. Keep the brace or splint clean. If the brace or splint is not waterproof: Do not let it get wet. Cover it with a watertight covering when you take a bath or a shower. If you have an elastic bandage (dressing): Remove it to shower or bathe. Try not to move your ankle much, but wiggle your toes from time to time. This helps to prevent swelling. Adjust the dressing if it feels too tight. Loosen the dressing if your foot: Loses feeling. Tingles. Becomes cold and blue. Managing pain, stiffness, and swelling  Take over-the-counter and prescription medicines only as told by doctor. For 2-3 days, keep your ankle raised (elevated) above the level of your heart. If told, put ice on the injured area: If you have a removable brace or splint, remove it as told by your doctor. Put ice in a plastic bag. Place a towel between your skin and the bag. Leave the ice on for 20 minutes, 2-3 times a day. General instructions Rest your ankle. Do not use your injured leg to support your body weight until your doctor says that you  can. Use crutches as told by your doctor. Do not use any products that contain nicotine or tobacco, such as cigarettes, e-cigarettes, and chewing tobacco. If you need help quitting, ask your doctor. Keep all follow-up visits as told by your doctor. Contact a doctor if: Your bruises or swelling are quickly getting worse. Your pain does not get better after you take medicine. Get help right away if: You cannot feel your toes or foot. Your foot or toes look blue. You have very bad pain that gets worse. Summary An ankle sprain is a stretch or tear in one of the tough tissues  (ligaments) that connect the bones in your ankle. This condition is caused by rolling or twisting the ankle. Symptoms include pain, swelling, bruising, and trouble walking. To help with pain and swelling, put ice on the injured ankle, raise your ankle above the level of your heart, and use an elastic bandage. Also, rest as told by your doctor. Keep all follow-up visits as told by your doctor. This is important. This information is not intended to replace advice given to you by your health care provider. Make sure you discuss any questions you have with your health care provider. Document Revised: 01/07/2021 Document Reviewed: 01/09/2021 Elsevier Patient Education  [place].

## 2022-10-07 ENCOUNTER — Encounter: Payer: Self-pay | Admitting: Nurse Practitioner

## 2022-10-07 ENCOUNTER — Other Ambulatory Visit: Payer: Self-pay | Admitting: Nurse Practitioner

## 2022-10-07 DIAGNOSIS — R8290 Unspecified abnormal findings in urine: Secondary | ICD-10-CM

## 2022-10-07 LAB — COMPLETE METABOLIC PANEL WITH GFR
AG Ratio: 1.4 (calc) (ref 1.0–2.5)
ALT: 10 U/L (ref 6–29)
AST: 20 U/L (ref 10–35)
Albumin: 3.7 g/dL (ref 3.6–5.1)
Alkaline phosphatase (APISO): 56 U/L (ref 37–153)
BUN: 10 mg/dL (ref 7–25)
CO2: 28 mmol/L (ref 20–32)
Calcium: 8.9 mg/dL (ref 8.6–10.4)
Chloride: 108 mmol/L (ref 98–110)
Creat: 0.93 mg/dL (ref 0.50–1.05)
Globulin: 2.6 g/dL (calc) (ref 1.9–3.7)
Glucose, Bld: 74 mg/dL (ref 65–99)
Potassium: 4 mmol/L (ref 3.5–5.3)
Sodium: 142 mmol/L (ref 135–146)
Total Bilirubin: 0.4 mg/dL (ref 0.2–1.2)
Total Protein: 6.3 g/dL (ref 6.1–8.1)
eGFR: 67 mL/min/{1.73_m2} (ref >=60)

## 2022-10-07 LAB — URINALYSIS, ROUTINE W REFLEX MICROSCOPIC
Bilirubin Urine: NEGATIVE
Glucose, UA: NEGATIVE
Hyaline Cast: NONE SEEN /LPF
Ketones, ur: NEGATIVE
Nitrite: POSITIVE — AB
Protein, ur: NEGATIVE
RBC / HPF: NONE SEEN /HPF (ref 0–2)
Specific Gravity, Urine: 1.012 (ref 1.001–1.035)
pH: 6 (ref 5.0–8.0)

## 2022-10-07 LAB — MAGNESIUM: Magnesium: 2.1 mg/dL (ref 1.5–2.5)

## 2022-10-07 LAB — HEMOGLOBIN A1C
Hgb A1c MFr Bld: 5.4 % of total Hgb (ref <5.7)
Mean Plasma Glucose: 108 mg/dL
eAG (mmol/L): 6 mmol/L

## 2022-10-07 LAB — MICROSCOPIC MESSAGE

## 2022-10-07 LAB — CBC WITH DIFFERENTIAL/PLATELET
Absolute Monocytes: 321 cells/uL (ref 200–950)
Basophils Absolute: 48 cells/uL (ref 0–200)
Basophils Relative: 1.1 %
Eosinophils Absolute: 172 cells/uL (ref 15–500)
Eosinophils Relative: 3.9 %
HCT: 34.1 % — ABNORMAL LOW (ref 35.0–45.0)
Hemoglobin: 11.6 g/dL — ABNORMAL LOW (ref 11.7–15.5)
Lymphs Abs: 1175 cells/uL (ref 850–3900)
MCH: 32.5 pg (ref 27.0–33.0)
MCHC: 34 g/dL (ref 32.0–36.0)
MCV: 95.5 fL (ref 80.0–100.0)
MPV: 11.7 fL (ref 7.5–12.5)
Monocytes Relative: 7.3 %
Neutro Abs: 2684 cells/uL (ref 1500–7800)
Neutrophils Relative %: 61 %
Platelets: 138 10*3/uL — ABNORMAL LOW (ref 140–400)
RBC: 3.57 10*6/uL — ABNORMAL LOW (ref 3.80–5.10)
RDW: 11.8 % (ref 11.0–15.0)
Total Lymphocyte: 26.7 %
WBC: 4.4 10*3/uL (ref 3.8–10.8)

## 2022-10-07 LAB — LIPID PANEL
Cholesterol: 123 mg/dL (ref <200)
HDL: 67 mg/dL (ref >=50)
LDL Cholesterol (Calc): 42 mg/dL (calc)
Non-HDL Cholesterol (Calc): 56 mg/dL (calc) (ref <130)
Total CHOL/HDL Ratio: 1.8 (calc) (ref <5.0)
Triglycerides: 48 mg/dL (ref <150)

## 2022-10-07 LAB — MICROALBUMIN / CREATININE URINE RATIO
Creatinine, Urine: 76 mg/dL (ref 20–275)
Microalb Creat Ratio: 4 mcg/mg creat (ref <30)
Microalb, Ur: 0.3 mg/dL

## 2022-10-07 LAB — VITAMIN D 25 HYDROXY (VIT D DEFICIENCY, FRACTURES): Vit D, 25-Hydroxy: 86 ng/mL (ref 30–100)

## 2022-10-07 LAB — TSH: TSH: 2.51 mIU/L (ref 0.40–4.50)

## 2022-10-07 MED ORDER — NITROFURANTOIN MONOHYD MACRO 100 MG PO CAPS: 100.0000 mg | ORAL_CAPSULE | Freq: Two times a day (BID) | ORAL | 0 refills | Status: AC

## 2022-10-10 LAB — URINE CULTURE
MICRO NUMBER:: 14161572
SPECIMEN QUALITY:: ADEQUATE

## 2022-10-12 ENCOUNTER — Other Ambulatory Visit: Payer: Self-pay | Admitting: Nurse Practitioner

## 2022-10-12 DIAGNOSIS — N3090 Cystitis, unspecified without hematuria: Secondary | ICD-10-CM

## 2022-10-12 MED ORDER — CIPROFLOXACIN HCL 500 MG PO TABS: 500.0000 mg | ORAL_TABLET | Freq: Two times a day (BID) | ORAL | 0 refills | Status: AC

## 2022-11-11 ENCOUNTER — Encounter: Payer: Self-pay | Admitting: Internal Medicine

## 2022-12-23 DIAGNOSIS — H5703 Miosis: Secondary | ICD-10-CM | POA: Diagnosis not present

## 2022-12-23 DIAGNOSIS — H04123 Dry eye syndrome of bilateral lacrimal glands: Secondary | ICD-10-CM | POA: Diagnosis not present

## 2022-12-23 DIAGNOSIS — H25813 Combined forms of age-related cataract, bilateral: Secondary | ICD-10-CM | POA: Diagnosis not present

## 2022-12-24 DIAGNOSIS — H524 Presbyopia: Secondary | ICD-10-CM | POA: Diagnosis not present

## 2022-12-24 DIAGNOSIS — H52223 Regular astigmatism, bilateral: Secondary | ICD-10-CM | POA: Diagnosis not present

## 2023-01-01 ENCOUNTER — Other Ambulatory Visit: Payer: Self-pay | Admitting: Nurse Practitioner

## 2023-01-01 MED ORDER — ROSUVASTATIN CALCIUM 10 MG PO TABS: ORAL_TABLET | ORAL | 1 refills | Status: DC

## 2023-02-05 ENCOUNTER — Encounter: Payer: Self-pay | Admitting: Nurse Practitioner

## 2023-02-05 ENCOUNTER — Other Ambulatory Visit: Payer: Self-pay | Admitting: Nurse Practitioner

## 2023-02-05 DIAGNOSIS — N3090 Cystitis, unspecified without hematuria: Secondary | ICD-10-CM

## 2023-02-05 MED ORDER — NITROFURANTOIN MONOHYD MACRO 100 MG PO CAPS: 100.0000 mg | ORAL_CAPSULE | Freq: Two times a day (BID) | ORAL | 0 refills | Status: AC

## 2023-03-25 ENCOUNTER — Encounter: Payer: Self-pay | Admitting: Nurse Practitioner

## 2023-03-26 ENCOUNTER — Other Ambulatory Visit: Payer: Self-pay | Admitting: Nurse Practitioner

## 2023-03-26 ENCOUNTER — Ambulatory Visit
Admission: RE | Admit: 2023-03-26 | Discharge: 2023-03-26 | Disposition: A | Discharge status: Home / Self Care | Payer: Medicare HMO | Source: Ambulatory Visit | Attending: Nurse Practitioner | Admitting: Nurse Practitioner

## 2023-03-26 DIAGNOSIS — K5901 Slow transit constipation: Secondary | ICD-10-CM

## 2023-03-26 DIAGNOSIS — K5900 Constipation, unspecified: Secondary | ICD-10-CM | POA: Diagnosis not present

## 2023-03-29 ENCOUNTER — Encounter: Payer: Self-pay | Admitting: Nurse Practitioner

## 2023-04-28 ENCOUNTER — Encounter: Payer: Self-pay | Admitting: Nurse Practitioner

## 2023-04-28 ENCOUNTER — Ambulatory Visit (INDEPENDENT_AMBULATORY_CARE_PROVIDER_SITE_OTHER): Payer: Medicare HMO | Admitting: Nurse Practitioner

## 2023-04-28 VITALS — BP 104/62 | HR 62 | Temp 97.3°F | Ht 62.5 in | Wt 126.2 lb

## 2023-04-28 DIAGNOSIS — I10 Essential (primary) hypertension: Secondary | ICD-10-CM

## 2023-04-28 DIAGNOSIS — R7309 Other abnormal glucose: Secondary | ICD-10-CM | POA: Diagnosis not present

## 2023-04-28 DIAGNOSIS — Z85828 Personal history of other malignant neoplasm of skin: Secondary | ICD-10-CM | POA: Diagnosis not present

## 2023-04-28 DIAGNOSIS — Z6825 Body mass index (BMI) 25.0-25.9, adult: Secondary | ICD-10-CM | POA: Diagnosis not present

## 2023-04-28 DIAGNOSIS — J309 Allergic rhinitis, unspecified: Secondary | ICD-10-CM

## 2023-04-28 DIAGNOSIS — Z79899 Other long term (current) drug therapy: Secondary | ICD-10-CM

## 2023-04-28 DIAGNOSIS — E785 Hyperlipidemia, unspecified: Secondary | ICD-10-CM | POA: Diagnosis not present

## 2023-04-28 DIAGNOSIS — Z860100 Personal history of colon polyps, unspecified: Secondary | ICD-10-CM

## 2023-04-28 DIAGNOSIS — E559 Vitamin D deficiency, unspecified: Secondary | ICD-10-CM | POA: Diagnosis not present

## 2023-04-28 DIAGNOSIS — E038 Other specified hypothyroidism: Secondary | ICD-10-CM

## 2023-04-28 DIAGNOSIS — M8580 Other specified disorders of bone density and structure, unspecified site: Secondary | ICD-10-CM | POA: Diagnosis not present

## 2023-04-28 DIAGNOSIS — E063 Autoimmune thyroiditis: Secondary | ICD-10-CM | POA: Diagnosis not present

## 2023-04-28 DIAGNOSIS — Z0000 Encounter for general adult medical examination without abnormal findings: Secondary | ICD-10-CM

## 2023-04-28 DIAGNOSIS — Z0001 Encounter for general adult medical examination with abnormal findings: Secondary | ICD-10-CM | POA: Diagnosis not present

## 2023-04-28 DIAGNOSIS — R6889 Other general symptoms and signs: Secondary | ICD-10-CM

## 2023-04-28 NOTE — Progress Notes (Signed)
MEDICARE WELLNESS   Assessment and Plan:  Annual Medicare Wellness Visit Due annually  Health maintenance reviewed  Hyperlipidemia, unspecified hyperlipidemia type Continue rosuvastatin Discussed lifestyle modifications. Recommended diet heavy in fruits and veggies, omega 3's. Decrease consumption of animal meats, cheeses, and dairy products. Remain active and exercise as tolerated. Continue to monitor. Check lipids/TSH  Essential hypertension Controlled with lifestyle  Discussed DASH (Dietary Approaches to Stop Hypertension) DASH diet is lower in sodium than a typical American diet. Cut back on foods that are high in saturated fat, cholesterol, and trans fats. Eat more whole-grain foods, fish, poultry, and nuts Remain active and exercise as tolerated daily.  Monitor BP at home-Call if greater than 130/80.  Check CMP/CBC  Hypothyroidism due to Hashimoto's thyroiditis Controlled. Continue Levothyroxine. Reminded to take on an empty stomach 30-8mins before food.  Stop any Biotin Supplement 48-72 hours before next TSH level to reduce the risk of falsely low TSH levels. Continue to monitor.     Abnormal glucose Education: Reviewed 'ABCs' of diabetes management  Discussed goals to be met and/or maintained include A1C (<7) Blood pressure (<130/80) Cholesterol (LDL <70) Continue Eye Exam yearly  Continue Dental Exam Q6 mo Discussed dietary recommendations Discussed Physical Activity recommendations Foot exam UTD Check A1C  Vitamin D deficiency Continue supplement for goal of 60-100 Monitor Vitamin D levels  Basal cell carcinoma (BCC), hx of  Monitor, derm follows  Allergic rhinitis, unspecified seasonality, unspecified trigger Consider antihistamine as directed as need Avoid triggers  History of colon polyps High fiber diet encouraged; next colonoscopy due 2032  Osteopenia GYN follows; Next DEXA due later fall 2023 Pursue a combination of weight-bearing  exercises and strength training. Patients with severe mobility impairment should be referred for physical therapy. Advised on fall prevention measures including proper lighting in all rooms, removal of area rugs and floor clutter, use of walking devices as deemed appropriate, avoidance of uneven walking surfaces. Smoking cessation and moderate alcohol consumption if applicable Consume 800 to 1000 IU of vitamin D daily with a goal vitamin D serum value of 30 ng/mL or higher. Aim for 1000 to 1200 mg of elemental calcium daily through supplements and/or dietary sources.  BMI 25 Discussed appropriate BMI Diet modification. Physical activity. Encouraged/praised to build confidence.  Orders Placed This Encounter  Procedures   CBC with Differential/Platelet   COMPLETE METABOLIC PANEL WITH GFR   Lipid panel   TSH   Hemoglobin A1c   VITAMIN D 25 Hydroxy (Vit-D Deficiency, Fractures)    Notify office for further evaluation and treatment, questions or concerns if any reported s/s fail to improve.   The patient was advised to call back or seek an in-person evaluation if any symptoms worsen or if the condition fails to improve as anticipated.   Further disposition pending results of labs. Discussed med's effects and SE's.    I discussed the assessment and treatment plan with the patient. The patient was provided an opportunity to ask questions and all were answered. The patient agreed with the plan and demonstrated an understanding of the instructions.  Discussed med's effects and SE's. Screening labs and tests as requested with regular follow-up as recommended.  I provided 35 minutes of face-to-face time during this encounter including counseling, chart review, and critical decision making was preformed.  Today's Plan of Care is based on a patient-centered health care approach known as shared decision making - the decisions, tests and treatments allow for patient preferences and values to be  balanced with clinical  evidence.     Future Appointments  Date Time Provider Department Center  10/07/2023 10:00 AM Raynelle Dick, NP [facility] None    Plan:   During the course of the visit the patient was educated and counseled about appropriate screening and preventive services including:   Pneumococcal vaccine  Prevnar 13 Influenza vaccine Td vaccine Screening electrocardiogram Bone densitometry screening Colorectal cancer screening Diabetes screening Glaucoma screening Nutrition counseling  Advanced directives: requested   HPI  67 y.o. female  presents for a MEDICARE WELLNESS AND FOLLOW UP. She has Hyperlipidemia; Vitamin D deficiency; Allergic rhinitis; History of basal cell carcinoma; HTN (hypertension); Hypothyroidism; Abnormal glucose (hx of prediabetes); and History of colon polyps on their problem list.  Overall she reports feeling well today.  She has no new concerns at this time.    History of BCC, lip and R eyelid, Dr. Yetta Barre follows yearly.  BMI is Body mass index is 22.71 kg/m., she has been working on diet, has been watching portions (weight watchers). Has changed diet, no longer eating out.  Has lost 30 lb   Wt Readings from Last 3 Encounters:  04/28/23 126 lb 3.2 oz (57.2 kg)  10/06/22 155 lb (70.3 kg)  04/23/22 147 lb 9.6 oz (67 kg)   Her blood pressure has been controlled at home, today their BP is BP: 104/62.   She does not workout. She denies chest pain, shortness of breath, dizziness.  Feels as though weight loss and diet change has contributed to lower BP.  She is asymptomatic.    She is on cholesterol medication, crestor 10 mg daily and denies myalgias. Her cholesterol is at goal. The cholesterol last visit was:  Lab Results  Component Value Date   CHOL 123 10/06/2022   HDL 67 10/06/2022   LDLCALC 42 10/06/2022   TRIG 48 10/06/2022   CHOLHDL 1.8 10/06/2022  . She has been working on diet and exercise for glucose management, hx of  prediabetes (A1C 5.7% in 2017). Last A1C in the office was:  Lab Results  Component Value Date   HGBA1C 5.4 10/06/2022   She is on thyroid medication. Her medication was changed last visit.  She is now taking 50 mcg daily. Takes first in AM on empty stomach.  Lab Results  Component Value Date   TSH 2.51 10/06/2022   Previously CKD III range, recently improved to normal with increased fluid intake. Last GFR:  Lab Results  Component Value Date   EGFR 67 10/06/2022   EGFR 80 04/23/2022   EGFR 76 10/06/2021   Patient is on Vitamin D supplement, 1500 IU a day.   Lab Results  Component Value Date   VD25OH 86 10/06/2022        Current Medications:   Current Outpatient Medications (Endocrine & Metabolic):    levothyroxine (SYNTHROID) 50 MCG tablet, Take 1 tablet (50 mcg total) by mouth daily before breakfast.  Current Outpatient Medications (Cardiovascular):    rosuvastatin (CRESTOR) 10 MG tablet, TAKE 1 TABLET BY MOUTH EVERY DAY FOR CHOLESTEROL  Current Outpatient Medications (Respiratory):    cetirizine (ZYRTEC) 10 MG tablet, Take 10 mg by mouth daily.    Current Outpatient Medications (Other):    Cholecalciferol (VITAMIN D3) 125 MCG (5000 UT) CAPS, Take by mouth daily.   Magnesium Hydroxide (MILK OF MAGNESIA PO), Take by mouth. PRN  Health Maintenance:   Immunization History  Administered Date(s) Administered   Influenza Inj Mdck Quad With Preservative 09/06/2017, 09/12/2018   Influenza Whole 09/02/2013  Influenza, High Dose Seasonal PF 08/31/2019, 10/03/2020, 08/27/2021, 10/06/2022   Influenza, Seasonal, Injecte, Preservative Fre 11/08/2015   Influenza-Unspecified 07/29/2016   Moderna SARS-COV2 Booster Vaccination 08/27/2021   PFIZER(Purple Top)SARS-COV-2 Vaccination 01/02/2020, 02/01/2020   PPD Test 04/24/2014   Pneumococcal Conjugate-13 05/30/2019   Pneumococcal Polysaccharide-23 04/13/2013   Tdap 10/16/2008, 09/06/2017   Zoster Recombinat (Shingrix) 08/31/2019,  02/21/2020   Health Maintenance  Topic Date Due   COVID-19 Vaccine (3 - Pfizer risk series) 09/24/2021   DEXA SCAN  08/21/2022   INFLUENZA VACCINE  07/01/2023   MAMMOGRAM  09/03/2023   Medicare Annual Wellness (AWV)  04/27/2024   DTaP/Tdap/Td (3 - Td or Tdap) 09/07/2027   Colonoscopy  02/26/2031   Hepatitis C Screening  Completed   Zoster Vaccines- Shingrix  Completed   HPV VACCINES  Aged Out   Pneumonia Vaccine 58+ Years old  Discontinued    Pap: Jan 2021, Dr. Senaida Ores follows MGM: 08/2022 at [facility]  DEXA: 08/2020 osteopenia R fem neck T -1.5 at [facility], Dr. Senaida Ores - reports having one completed 2023 with mammogram.    Colonoscopy: 01/2021, benign polyps, Dr. Marina Goodell, repeat 10 years  Last Dental Exam: Dr. Dutch Quint, 2023, q11m Last Eye Exam: Dr. Sherrine Maples, last 06/2022, mild L catact, monitoring  Patient Care Team: Lucky Cowboy, MD as PCP - General (Internal Medicine) Hilarie Fredrickson, MD as Consulting Physician (Gastroenterology) Arminda Resides, MD as Consulting Physician (Dermatology) Drema Halon, MD (Inactive) as Consulting Physician (Otolaryngology) Huel Cote, MD as Consulting Physician (Obstetrics and Gynecology)  Medical History:  Past Medical History:  Diagnosis Date   Allergic rhinitis    Allergy    seasonal allergies   Basal cell carcinoma    Cataract    LEFT-not a surgical candidate at this time (02/11/2021)   Hyperlipidemia    on meds   Hypothyroidism    PONV (postoperative nausea and vomiting)    Thyroid disease    on meds   Vitamin D deficiency    Allergies No Known Allergies  SURGICAL HISTORY She  has a past surgical history that includes Tympanoplasty (Left); Tympanoplasty (Right, 06/07/2020); Wisdom tooth extraction; Colonoscopy (2011); and Colonoscopy (02/25/2021). FAMILY HISTORY Her family history includes Cancer in her maternal uncle; Cancer (age of onset: 4) in her sister; Heart attack in her mother; Heart disease in her mother;  Hyperlipidemia in her mother; Hypertension in her mother; Varicose Veins in her sister. SOCIAL HISTORY She  reports that she has never smoked. She has never used smokeless tobacco. She reports that she does not drink alcohol and does not use drugs.  MEDICARE WELLNESS OBJECTIVES: Physical activity:   Cardiac risk factors:   Depression/mood screen:      04/28/2023    4:18 PM  Depression screen PHQ 2/9  Decreased Interest 0  Down, Depressed, Hopeless 0  PHQ - 2 Score 0    ADLs:     04/28/2023    4:18 PM  In your present state of health, do you have any difficulty performing the following activities:  Hearing? 0  Vision? 0  Difficulty concentrating or making decisions? 0  Walking or climbing stairs? 0  Dressing or bathing? 0  Doing errands, shopping? 0     Cognitive Testing  Alert? Yes  Normal Appearance?Yes  Oriented to person? Yes  Place? Yes   Time? Yes  Recall of three objects?  Yes  Can perform simple calculations? Yes  Displays appropriate judgment?Yes  Can read the correct time from a watch face?Yes  EOL  planning:     Review of Systems: Review of Systems  Constitutional:  Negative for malaise/fatigue and weight loss.  HENT:  Negative for hearing loss and tinnitus.   Eyes:  Negative for blurred vision and double vision.  Respiratory:  Negative for cough, sputum production, shortness of breath and wheezing.   Cardiovascular:  Negative for chest pain, palpitations, orthopnea, claudication, leg swelling and PND.  Gastrointestinal:  Negative for abdominal pain, blood in stool, constipation, diarrhea, heartburn, melena, nausea and vomiting.  Genitourinary: Negative.   Musculoskeletal:  Negative for falls, joint pain and myalgias.  Skin:  Negative for rash.  Neurological:  Negative for dizziness, tingling, sensory change, weakness and headaches.  Endo/Heme/Allergies:  Negative for polydipsia.  Psychiatric/Behavioral: Negative.  Negative for depression, memory loss,  substance abuse and suicidal ideas. The patient is not nervous/anxious and does not have insomnia.   All other systems reviewed and are negative.   Physical Exam: Estimated body mass index is 22.71 kg/m as calculated from the following:   Height as of this encounter: 5' 2.5" (1.588 m).   Weight as of this encounter: 126 lb 3.2 oz (57.2 kg). BP 104/62   Pulse 62   Temp (!) 97.3 F (36.3 C)   Ht 5' 2.5" (1.588 m)   Wt 126 lb 3.2 oz (57.2 kg)   SpO2 96%   BMI 22.71 kg/m   General Appearance: Well nourished well developed, in no apparent distress.  Eyes: PERRLA, EOMs, conjunctiva no swelling or erythema ENT/Mouth: Ear canals clear, TMs normal bilaterally with no erythema, bulging, retraction, or loss of landmark.  Oropharynx moist and clear with no exudate, erythema, or swelling.   Neck: Supple, thyroid normal. No bruits.  No cervical adenopathy Respiratory: Respiratory effort normal, Breath sounds clear A&P without wheeze, rhonchi, rales.   Cardio: RRR without murmurs, rubs or gallops. Brisk peripheral pulses without edema.  Abdomen: Soft, nontender, no guarding, rebound, hernias, masses, or organomegaly.  Lymphatics: Non tender without lymphadenopathy.  Musculoskeletal: Full ROM all peripheral extremities,5/5 strength, and normal gait. Skin: Warm, dry without rashes, lesions, ecchymosis. Neuro: Awake and oriented X 3, Cranial nerves intact, reflexes equal bilaterally. Normal muscle tone, no cerebellar symptoms. Sensation intact.  Psych:  normal affect, Insight and Judgment appropriate.    Medicare Attestation I have personally reviewed: The patient's medical and social history Their use of alcohol, tobacco or illicit drugs Their current medications and supplements The patient's functional ability including ADLs,fall risks, home safety risks, cognitive, and hearing and visual impairment Diet and physical activities Evidence for depression or mood disorders  The patient's  weight, height, BMI, and visual acuity have been recorded in the chart.  I have made referrals, counseling, and provided education to the patient based on review of the above and I have provided the patient with a written personalized care plan for preventive services.     Adela Glimpse, DNP, AGNP-C 4:18 PM [facility]

## 2023-04-28 NOTE — Patient Instructions (Signed)

## 2023-04-29 LAB — LIPID PANEL
Cholesterol: 144 mg/dL (ref <200)
HDL: 58 mg/dL (ref >=50)
LDL Cholesterol (Calc): 70 mg/dL (calc)
Non-HDL Cholesterol (Calc): 86 mg/dL (calc) (ref <130)
Total CHOL/HDL Ratio: 2.5 (calc) (ref <5.0)
Triglycerides: 80 mg/dL (ref <150)

## 2023-04-29 LAB — CBC WITH DIFFERENTIAL/PLATELET
Absolute Monocytes: 412 cells/uL (ref 200–950)
Basophils Absolute: 49 cells/uL (ref 0–200)
Basophils Relative: 1 %
Eosinophils Absolute: 132 cells/uL (ref 15–500)
Eosinophils Relative: 2.7 %
HCT: 35.4 % (ref 35.0–45.0)
Hemoglobin: 11.8 g/dL (ref 11.7–15.5)
Lymphs Abs: 1759 cells/uL (ref 850–3900)
MCH: 32.1 pg (ref 27.0–33.0)
MCHC: 33.3 g/dL (ref 32.0–36.0)
MCV: 96.2 fL (ref 80.0–100.0)
MPV: 12.6 fL — ABNORMAL HIGH (ref 7.5–12.5)
Monocytes Relative: 8.4 %
Neutro Abs: 2548 cells/uL (ref 1500–7800)
Neutrophils Relative %: 52 %
Platelets: 125 10*3/uL — ABNORMAL LOW (ref 140–400)
RBC: 3.68 10*6/uL — ABNORMAL LOW (ref 3.80–5.10)
RDW: 12.1 % (ref 11.0–15.0)
Total Lymphocyte: 35.9 %
WBC: 4.9 10*3/uL (ref 3.8–10.8)

## 2023-04-29 LAB — COMPLETE METABOLIC PANEL WITH GFR
AG Ratio: 1.6 (calc) (ref 1.0–2.5)
ALT: 10 U/L (ref 6–29)
AST: 15 U/L (ref 10–35)
Albumin: 4 g/dL (ref 3.6–5.1)
Alkaline phosphatase (APISO): 52 U/L (ref 37–153)
BUN: 10 mg/dL (ref 7–25)
CO2: 29 mmol/L (ref 20–32)
Calcium: 9.3 mg/dL (ref 8.6–10.4)
Chloride: 105 mmol/L (ref 98–110)
Creat: 0.84 mg/dL (ref 0.50–1.05)
Globulin: 2.5 g/dL (calc) (ref 1.9–3.7)
Glucose, Bld: 81 mg/dL (ref 65–139)
Potassium: 4.2 mmol/L (ref 3.5–5.3)
Sodium: 140 mmol/L (ref 135–146)
Total Bilirubin: 0.5 mg/dL (ref 0.2–1.2)
Total Protein: 6.5 g/dL (ref 6.1–8.1)
eGFR: 75 mL/min/{1.73_m2} (ref >=60)

## 2023-04-29 LAB — HEMOGLOBIN A1C
Hgb A1c MFr Bld: 5.3 % of total Hgb (ref <5.7)
Mean Plasma Glucose: 105 mg/dL
eAG (mmol/L): 5.8 mmol/L

## 2023-04-29 LAB — TSH: TSH: 0.84 mIU/L (ref 0.40–4.50)

## 2023-04-29 LAB — VITAMIN D 25 HYDROXY (VIT D DEFICIENCY, FRACTURES): Vit D, 25-Hydroxy: 110 ng/mL — ABNORMAL HIGH (ref 30–100)

## 2023-07-02 ENCOUNTER — Other Ambulatory Visit: Payer: Self-pay | Admitting: Nurse Practitioner

## 2023-07-18 IMAGING — CT CT HEAD W/O CM
4 series · 16 of 47 positions shown, 18 images · non-contrast
Comparison: None.

CLINICAL DATA: Sudden severe headache beginning last night



[Series 2: head wo · axial · 0.41mm/px · z∈[+760,+880]mm · 7 of 32 slices shown, 9 images]
[im 4/32  brain]
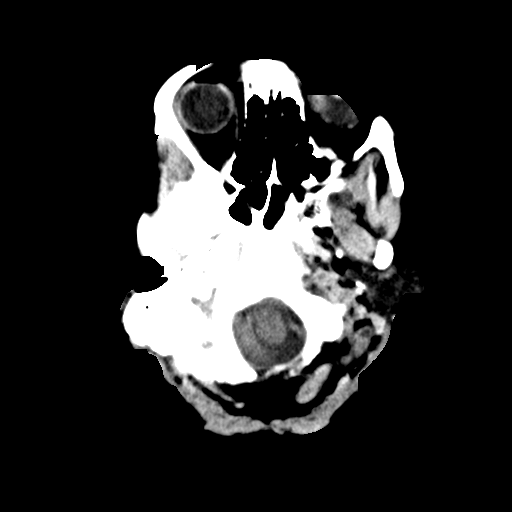
[im 4/32  bone]
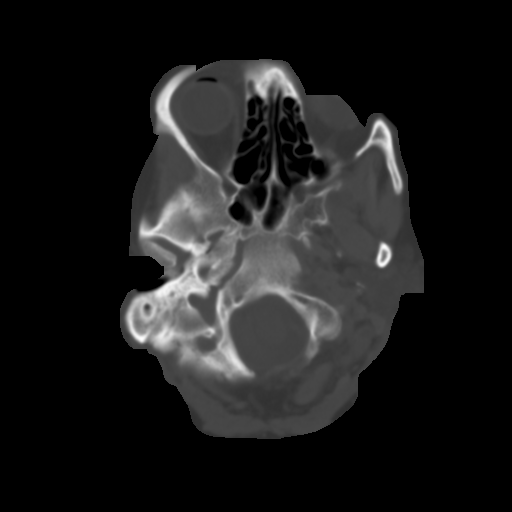
[im 8/32  brain]
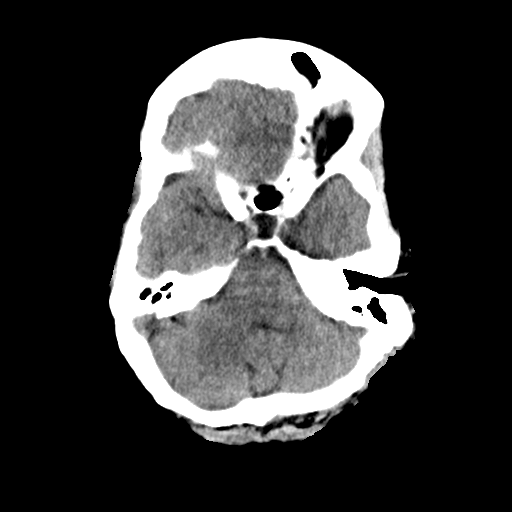
[im 12/32  brain]
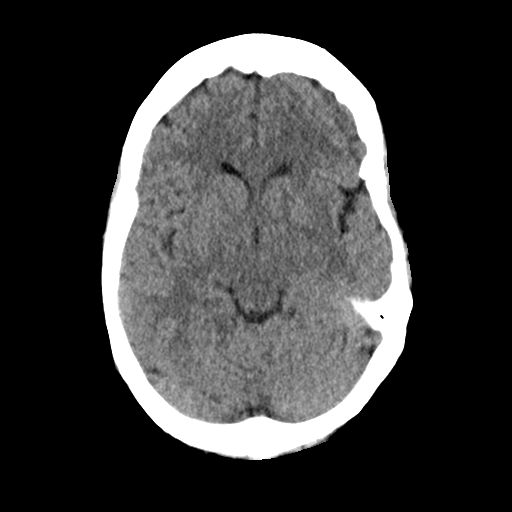
[im 16/32  brain]
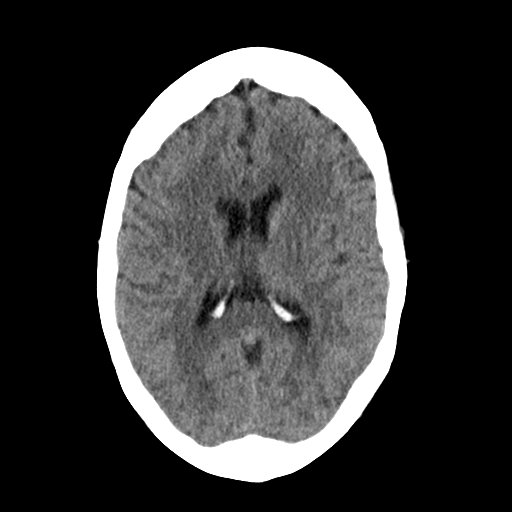
[im 20/32  brain]
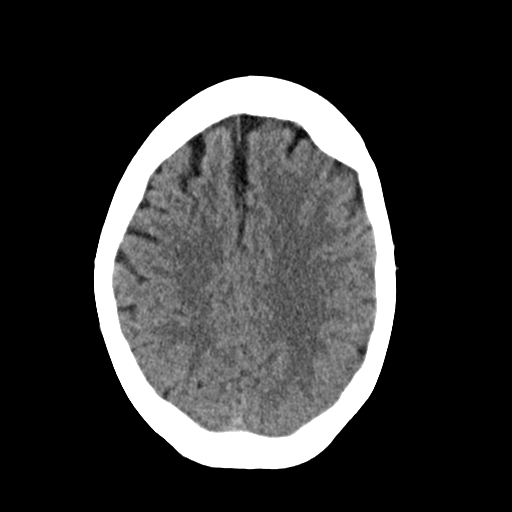
[im 20/32  bone]
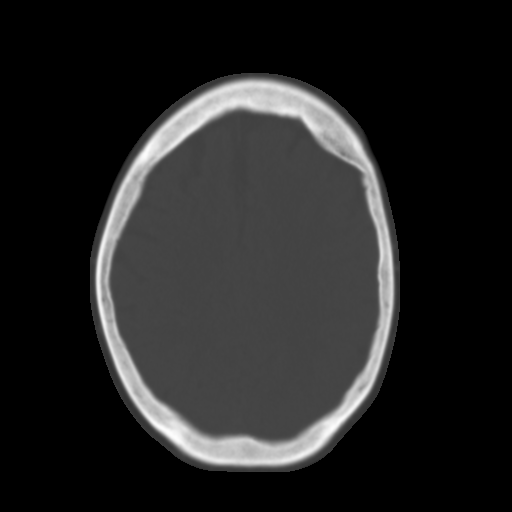
[im 24/32  brain]
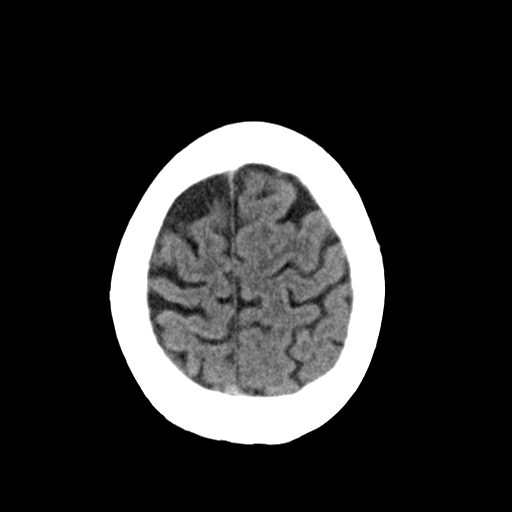
[im 28/32  brain]
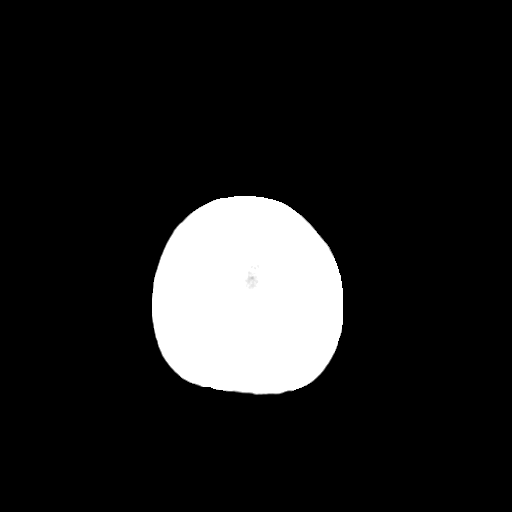

[Series 3: head bone · axial · 0.41mm/px · z∈[+759,+791]mm · 3 of 79 slices shown]
[im 8/79  bone]
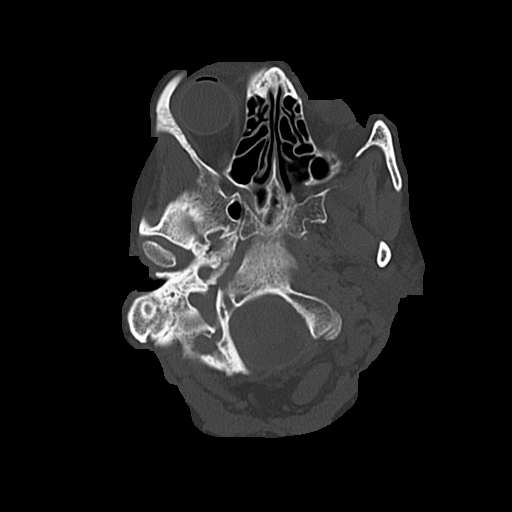
[im 16/79  bone]
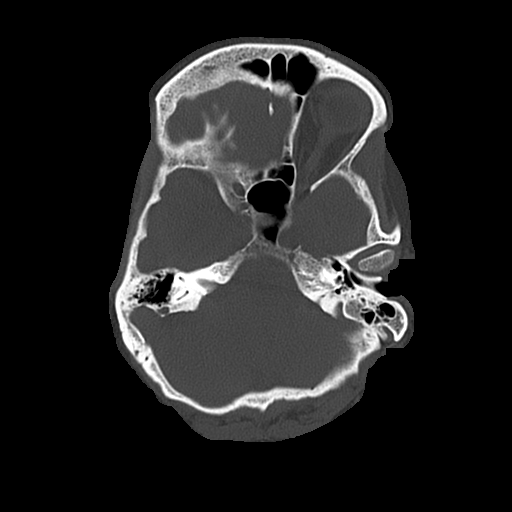
[im 24/79  bone]
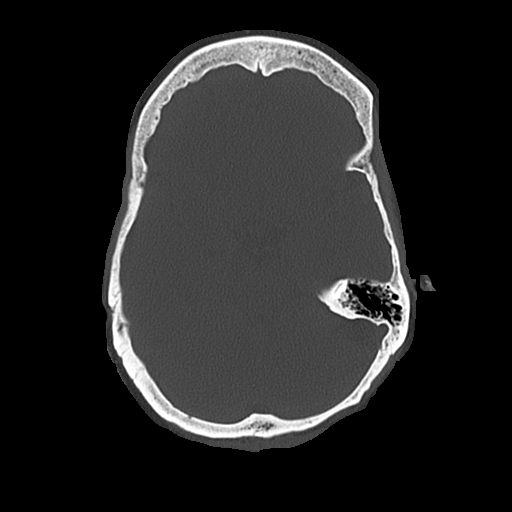

[Series 4: coronal soft · coronal · 0.29mm/px · 3 of 66 slices shown]
[im 22/66  brain]
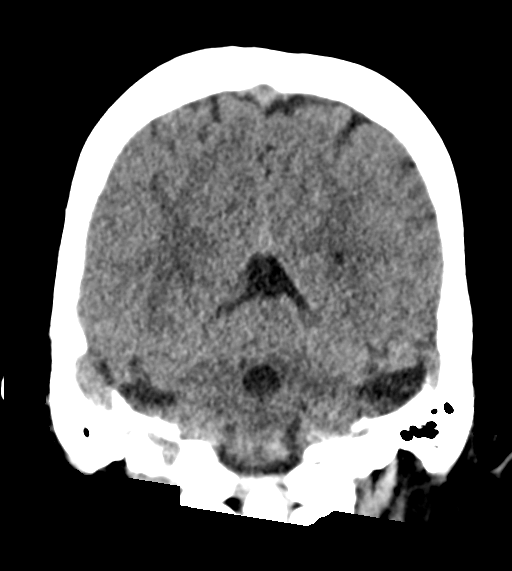
[im 29/66  brain]
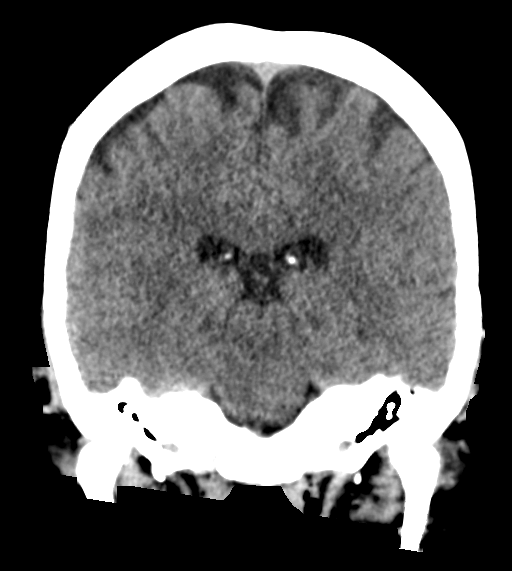
[im 37/66  brain]
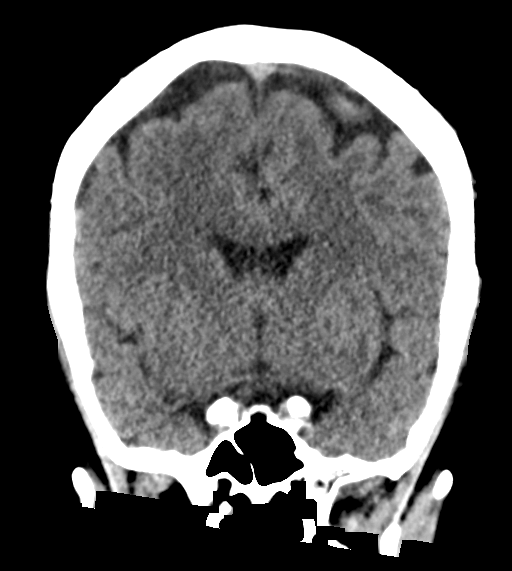

[Series 5: sagittal soft · sagittal · 0.32mm/px · 3 of 50 slices shown]
[im 17/50  brain]
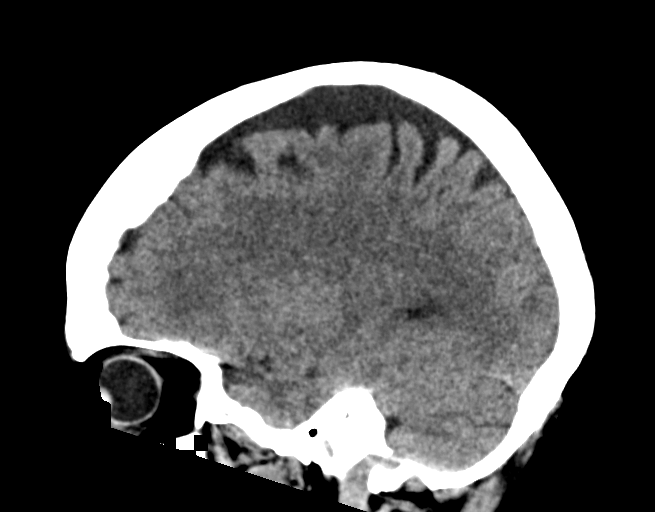
[im 25/50  brain]
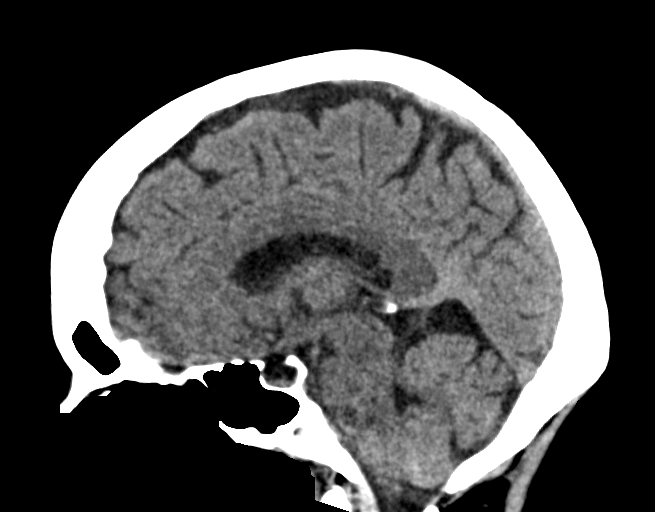
[im 33/50  brain]
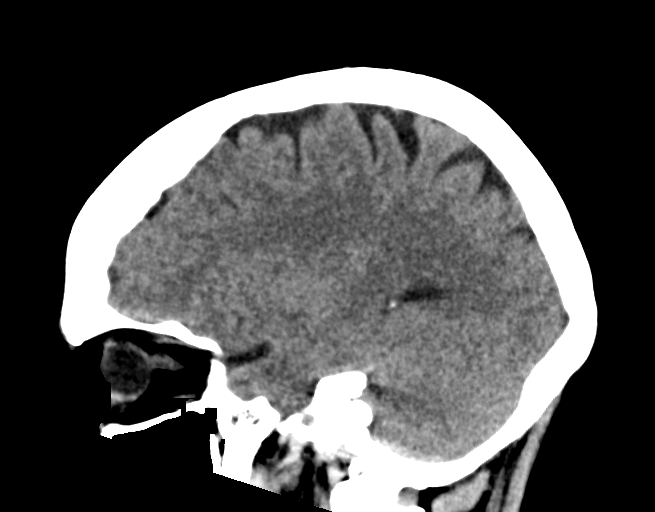

[16 of 47 positions shown; findings below may reference images not displayed]

FINDINGS: Brain: No evidence of acute infarction, hemorrhage, hydrocephalus,
extra-axial collection or mass lesion/mass effect.

Vascular: No hyperdense vessel or unexpected calcification.

Skull: Normal. Negative for fracture or focal lesion.

Sinuses/Orbits: No acute finding.
IMPRESSION: Negative head CT.

## 2023-08-10 ENCOUNTER — Encounter: Payer: Self-pay | Admitting: Nurse Practitioner

## 2023-08-10 ENCOUNTER — Ambulatory Visit (INDEPENDENT_AMBULATORY_CARE_PROVIDER_SITE_OTHER): Payer: Medicare HMO | Admitting: Nurse Practitioner

## 2023-08-10 VITALS — BP 104/64 | HR 76 | Temp 97.3°F | Ht 62.5 in | Wt 118.2 lb

## 2023-08-10 DIAGNOSIS — E038 Other specified hypothyroidism: Secondary | ICD-10-CM | POA: Diagnosis not present

## 2023-08-10 DIAGNOSIS — Z6825 Body mass index (BMI) 25.0-25.9, adult: Secondary | ICD-10-CM | POA: Diagnosis not present

## 2023-08-10 DIAGNOSIS — J309 Allergic rhinitis, unspecified: Secondary | ICD-10-CM | POA: Diagnosis not present

## 2023-08-10 DIAGNOSIS — I10 Essential (primary) hypertension: Secondary | ICD-10-CM | POA: Diagnosis not present

## 2023-08-10 DIAGNOSIS — E063 Autoimmune thyroiditis: Secondary | ICD-10-CM

## 2023-08-10 DIAGNOSIS — R7309 Other abnormal glucose: Secondary | ICD-10-CM

## 2023-08-10 DIAGNOSIS — Z860100 Personal history of colon polyps, unspecified: Secondary | ICD-10-CM

## 2023-08-10 DIAGNOSIS — M8580 Other specified disorders of bone density and structure, unspecified site: Secondary | ICD-10-CM

## 2023-08-10 DIAGNOSIS — Z85828 Personal history of other malignant neoplasm of skin: Secondary | ICD-10-CM | POA: Diagnosis not present

## 2023-08-10 DIAGNOSIS — E559 Vitamin D deficiency, unspecified: Secondary | ICD-10-CM

## 2023-08-10 DIAGNOSIS — Z79899 Other long term (current) drug therapy: Secondary | ICD-10-CM

## 2023-08-10 DIAGNOSIS — E785 Hyperlipidemia, unspecified: Secondary | ICD-10-CM | POA: Diagnosis not present

## 2023-08-10 NOTE — Progress Notes (Signed)
FOLLOW UP  Assessment and Plan:  Hyperlipidemia, unspecified hyperlipidemia type Continue rosuvastatin Discussed lifestyle modifications. Recommended diet heavy in fruits and veggies, omega 3's. Decrease consumption of animal meats, cheeses, and dairy products. Remain active and exercise as tolerated. Continue to monitor. Check lipids/TSH  Essential hypertension Controlled with lifestyle  Discussed DASH (Dietary Approaches to Stop Hypertension) DASH diet is lower in sodium than a typical American diet. Cut back on foods that are high in saturated fat, cholesterol, and trans fats. Eat more whole-grain foods, fish, poultry, and nuts Remain active and exercise as tolerated daily.  Monitor BP at home-Call if greater than 130/80.  Check CMP/CBC  Hypothyroidism due to Hashimoto's thyroiditis Controlled. Continue Levothyroxine. Reminded to take on an empty stomach 30-70mins before food.  Stop any Biotin Supplement 48-72 hours before next TSH level to reduce the risk of falsely low TSH levels. Continue to monitor.     Abnormal glucose Education: Reviewed 'ABCs' of diabetes management  Discussed goals to be met and/or maintained include A1C (<7) Blood pressure (<130/80) Cholesterol (LDL <70) Continue Eye Exam yearly  Continue Dental Exam Q6 mo Discussed dietary recommendations Discussed Physical Activity recommendations Foot exam UTD Check A1C  Vitamin D deficiency Continue supplement for goal of 60-100 Monitor Vitamin D levels  Hx of basal cell carcinoma (BCC), hx of  Monitor, derm follows  Allergic rhinitis, unspecified seasonality, unspecified trigger Consider antihistamine as directed as need Avoid triggers  History of colon polyps High fiber diet encouraged; next colonoscopy due 2032  Osteopenia GYN follows; Next DEXA due later fall 2023 Pursue a combination of weight-bearing exercises and strength training. Patients with severe mobility impairment should be  referred for physical therapy. Advised on fall prevention measures including proper lighting in all rooms, removal of area rugs and floor clutter, use of walking devices as deemed appropriate, avoidance of uneven walking surfaces. Smoking cessation and moderate alcohol consumption if applicable Consume 800 to 1000 IU of vitamin D daily with a goal vitamin D serum value of 30 ng/mL or higher. Aim for 1000 to 1200 mg of elemental calcium daily through supplements and/or dietary sources.  BMI 25 Discussed appropriate BMI Diet modification. Physical activity. Encouraged/praised to build confidence.  Orders Placed This Encounter  Procedures   CBC with Differential/Platelet   COMPLETE METABOLIC PANEL WITH GFR   Lipid panel   Hemoglobin A1c   TSH    Notify office for further evaluation and treatment, questions or concerns if any reported s/s fail to improve.   The patient was advised to call back or seek an in-person evaluation if any symptoms worsen or if the condition fails to improve as anticipated.   Further disposition pending results of labs. Discussed med's effects and SE's.    I discussed the assessment and treatment plan with the patient. The patient was provided an opportunity to ask questions and all were answered. The patient agreed with the plan and demonstrated an understanding of the instructions.  Discussed med's effects and SE's. Screening labs and tests as requested with regular follow-up as recommended.  I provided 35 minutes of face-to-face time during this encounter including counseling, chart review, and critical decision making was preformed.  Today's Plan of Care is based on a patient-centered health care approach known as shared decision making - the decisions, tests and treatments allow for patient preferences and values to be balanced with clinical evidence.     Future Appointments  Date Time Provider Department Center  11/16/2023  9:00 AM Anda Kraft  E,  NP [facility] None    HPI  67 y.o. female  presents for a general follow up. She has Hyperlipidemia; Vitamin D deficiency; Allergic rhinitis; History of basal cell carcinoma; HTN (hypertension); Hypothyroidism; Abnormal glucose (hx of prediabetes); and History of colon polyps on their problem list.  Overall she reports feeling well today.  She has no new concerns at this time.    History of BCC, lip and R eyelid, Dr. Yetta Barre follows yearly.  BMI is Body mass index is 21.27 kg/m., she has been working on diet, has been watching portions (weight watchers). Has changed diet, no longer eating out.  Has lost 30 lb   Wt Readings from Last 3 Encounters:  08/10/23 118 lb 3.2 oz (53.6 kg)  04/28/23 126 lb 3.2 oz (57.2 kg)  10/06/22 155 lb (70.3 kg)   Her blood pressure has been controlled at home, today their BP is BP: 104/64.   She does not workout. She denies chest pain, shortness of breath, dizziness.  Feels as though weight loss and diet change has contributed to lower BP.  She is asymptomatic.    She is on cholesterol medication, crestor 10 mg daily and denies myalgias. Her cholesterol is at goal. The cholesterol last visit was:  Lab Results  Component Value Date   CHOL 144 04/28/2023   HDL 58 04/28/2023   LDLCALC 70 04/28/2023   TRIG 80 04/28/2023   CHOLHDL 2.5 04/28/2023  . She has been working on diet and exercise for glucose management, hx of prediabetes (A1C 5.7% in 2017). Last A1C in the office was:  Lab Results  Component Value Date   HGBA1C 5.3 04/28/2023   She is on thyroid medication. Her medication was changed last visit.  She is now taking 50 mcg daily. Takes first in AM on empty stomach.  Lab Results  Component Value Date   TSH 0.84 04/28/2023   Previously CKD III range, recently improved to normal with increased fluid intake. Last GFR:  Lab Results  Component Value Date   EGFR 75 04/28/2023   EGFR 67 10/06/2022   EGFR 80 04/23/2022   Patient is on Vitamin D  supplement, 1500 IU a day.   Lab Results  Component Value Date   VD25OH 110 (H) 04/28/2023        Current Medications:   Current Outpatient Medications (Endocrine & Metabolic):    levothyroxine (SYNTHROID) 50 MCG tablet, Take 1 tablet (50 mcg total) by mouth daily before breakfast.  Current Outpatient Medications (Cardiovascular):    rosuvastatin (CRESTOR) 10 MG tablet, TAKE 1 TABLET BY MOUTH EVERY DAY FOR CHOLESTEROL  Current Outpatient Medications (Respiratory):    cetirizine (ZYRTEC) 10 MG tablet, Take 10 mg by mouth daily.    Current Outpatient Medications (Other):    Cholecalciferol (VITAMIN D3) 125 MCG (5000 UT) CAPS, Take by mouth daily.   Magnesium Hydroxide (MILK OF MAGNESIA PO), Take by mouth. PRN (Patient not taking: Reported on 08/10/2023)  Health Maintenance:   Immunization History  Administered Date(s) Administered   Influenza Inj Mdck Quad With Preservative 09/06/2017, 09/12/2018   Influenza Whole 09/02/2013   Influenza, High Dose Seasonal PF 08/31/2019, 10/03/2020, 08/27/2021, 10/06/2022   Influenza, Seasonal, Injecte, Preservative Fre 11/08/2015   Influenza-Unspecified 07/29/2016   Moderna SARS-COV2 Booster Vaccination 08/27/2021   PFIZER(Purple Top)SARS-COV-2 Vaccination 01/02/2020, 02/01/2020   PPD Test 04/24/2014   Pneumococcal Conjugate-13 05/30/2019   Pneumococcal Polysaccharide-23 04/13/2013   Tdap 10/16/2008, 09/06/2017   Zoster Recombinant(Shingrix) 08/31/2019, 02/21/2020   Health  Maintenance  Topic Date Due   COVID-19 Vaccine (3 - Pfizer risk series) 09/24/2021   DEXA SCAN  08/21/2022   INFLUENZA VACCINE  07/01/2023   MAMMOGRAM  09/03/2023   Medicare Annual Wellness (AWV)  04/27/2024   DTaP/Tdap/Td (3 - Td or Tdap) 09/07/2027   Colonoscopy  02/26/2031   Hepatitis C Screening  Completed   Zoster Vaccines- Shingrix  Completed   HPV VACCINES  Aged Out   Pneumonia Vaccine 35+ Years old  Discontinued    Pap: Jan 2021, Dr. Senaida Ores  follows [facility]: 08/2022 at [facility]  DEXA: 08/2020 osteopenia R fem neck T -1.5 at [facility], Dr. Senaida Ores - reports having one completed 2023 with mammogram.    Colonoscopy: 01/2021, benign polyps, Dr. Marina Goodell, repeat 10 years  Last Dental Exam: Dr. Dutch Quint, 2023, q27m Last Eye Exam: Dr. Sherrine Maples, last 06/2022, mild L catact, monitoring  Patient Care Team: Lucky Cowboy, MD as PCP - General (Internal Medicine) Hilarie Fredrickson, MD as Consulting Physician (Gastroenterology) Arminda Resides, MD as Consulting Physician (Dermatology) Drema Halon, MD (Inactive) as Consulting Physician (Otolaryngology) Huel Cote, MD as Consulting Physician (Obstetrics and Gynecology)  Medical History:  Past Medical History:  Diagnosis Date   Allergic rhinitis    Allergy    seasonal allergies   Basal cell carcinoma    Cataract    LEFT-not a surgical candidate at this time (02/11/2021)   Hyperlipidemia    on meds   Hypothyroidism    PONV (postoperative nausea and vomiting)    Thyroid disease    on meds   Vitamin D deficiency    Allergies No Known Allergies  SURGICAL HISTORY She  has a past surgical history that includes Tympanoplasty (Left); Tympanoplasty (Right, 06/07/2020); Wisdom tooth extraction; Colonoscopy (2011); and Colonoscopy (02/25/2021). FAMILY HISTORY Her family history includes Cancer in her maternal uncle; Cancer (age of onset: 86) in her sister; Heart attack in her mother; Heart disease in her mother; Hyperlipidemia in her mother; Hypertension in her mother; Varicose Veins in her sister. SOCIAL HISTORY She  reports that she has never smoked. She has never used smokeless tobacco. She reports that she does not drink alcohol and does not use drugs.  Review of Systems: Review of Systems  Constitutional:  Negative for malaise/fatigue and weight loss.  HENT:  Negative for hearing loss and tinnitus.   Eyes:  Negative for blurred vision and double vision.  Respiratory:  Negative for  cough, sputum production, shortness of breath and wheezing.   Cardiovascular:  Negative for chest pain, palpitations, orthopnea, claudication, leg swelling and PND.  Gastrointestinal:  Negative for abdominal pain, blood in stool, constipation, diarrhea, heartburn, melena, nausea and vomiting.  Genitourinary: Negative.   Musculoskeletal:  Negative for falls, joint pain and myalgias.  Skin:  Negative for rash.  Neurological:  Negative for dizziness, tingling, sensory change, weakness and headaches.  Endo/Heme/Allergies:  Negative for polydipsia.  Psychiatric/Behavioral: Negative.  Negative for depression, memory loss, substance abuse and suicidal ideas. The patient is not nervous/anxious and does not have insomnia.   All other systems reviewed and are negative.   Physical Exam: Estimated body mass index is 21.27 kg/m as calculated from the following:   Height as of this encounter: 5' 2.5" (1.588 m).   Weight as of this encounter: 118 lb 3.2 oz (53.6 kg). BP 104/64   Pulse 76   Temp (!) 97.3 F (36.3 C)   Ht 5' 2.5" (1.588 m)   Wt 118 lb 3.2 oz (53.6 kg)  SpO2 99%   BMI 21.27 kg/m   General Appearance: Well nourished well developed, in no apparent distress.  Eyes: PERRLA, EOMs, conjunctiva no swelling or erythema ENT/Mouth: Ear canals clear, TMs normal bilaterally with no erythema, bulging, retraction, or loss of landmark.  Oropharynx moist and clear with no exudate, erythema, or swelling.   Neck: Supple, thyroid normal. No bruits.  No cervical adenopathy Respiratory: Respiratory effort normal, Breath sounds clear A&P without wheeze, rhonchi, rales.   Cardio: RRR without murmurs, rubs or gallops. Brisk peripheral pulses without edema.  Abdomen: Soft, nontender, no guarding, rebound, hernias, masses, or organomegaly.  Lymphatics: Non tender without lymphadenopathy.  Musculoskeletal: Full ROM all peripheral extremities,5/5 strength, and normal gait. Skin: Warm, dry without rashes,  lesions, ecchymosis. Neuro: Awake and oriented X 3, Cranial nerves intact, reflexes equal bilaterally. Normal muscle tone, no cerebellar symptoms. Sensation intact.  Psych:  normal affect, Insight and Judgment appropriate.    Adela Glimpse, DNP, AGNP-C 11:28 AM [facility]

## 2023-08-10 NOTE — Patient Instructions (Signed)

## 2023-08-11 LAB — CBC WITH DIFFERENTIAL/PLATELET
Absolute Monocytes: 328 {cells}/uL (ref 200–950)
Basophils Absolute: 42 {cells}/uL (ref 0–200)
Basophils Relative: 1 %
Eosinophils Absolute: 168 {cells}/uL (ref 15–500)
Eosinophils Relative: 4 %
HCT: 33 % — ABNORMAL LOW (ref 35.0–45.0)
Hemoglobin: 11 g/dL — ABNORMAL LOW (ref 11.7–15.5)
Lymphs Abs: 1008 {cells}/uL (ref 850–3900)
MCH: 32.8 pg (ref 27.0–33.0)
MCHC: 33.3 g/dL (ref 32.0–36.0)
MCV: 98.5 fL (ref 80.0–100.0)
MPV: 11.7 fL (ref 7.5–12.5)
Monocytes Relative: 7.8 %
Neutro Abs: 2654 {cells}/uL (ref 1500–7800)
Neutrophils Relative %: 63.2 %
Platelets: 130 10*3/uL — ABNORMAL LOW (ref 140–400)
RBC: 3.35 10*6/uL — ABNORMAL LOW (ref 3.80–5.10)
RDW: 11.7 % (ref 11.0–15.0)
Total Lymphocyte: 24 %
WBC: 4.2 10*3/uL (ref 3.8–10.8)

## 2023-08-11 LAB — COMPLETE METABOLIC PANEL WITH GFR
AG Ratio: 1.5 (calc) (ref 1.0–2.5)
ALT: 19 U/L (ref 6–29)
AST: 19 U/L (ref 10–35)
Albumin: 3.7 g/dL (ref 3.6–5.1)
Alkaline phosphatase (APISO): 60 U/L (ref 37–153)
BUN: 8 mg/dL (ref 7–25)
CO2: 30 mmol/L (ref 20–32)
Calcium: 9.1 mg/dL (ref 8.6–10.4)
Chloride: 106 mmol/L (ref 98–110)
Creat: 0.83 mg/dL (ref 0.50–1.05)
Globulin: 2.5 g/dL (ref 1.9–3.7)
Glucose, Bld: 78 mg/dL (ref 65–99)
Potassium: 4.4 mmol/L (ref 3.5–5.3)
Sodium: 141 mmol/L (ref 135–146)
Total Bilirubin: 0.5 mg/dL (ref 0.2–1.2)
Total Protein: 6.2 g/dL (ref 6.1–8.1)
eGFR: 76 mL/min/{1.73_m2} (ref >=60)

## 2023-08-11 LAB — LIPID PANEL
Cholesterol: 136 mg/dL (ref <200)
HDL: 65 mg/dL (ref >=50)
LDL Cholesterol (Calc): 57 mg/dL
Non-HDL Cholesterol (Calc): 71 mg/dL (ref <130)
Total CHOL/HDL Ratio: 2.1 (calc) (ref <5.0)
Triglycerides: 63 mg/dL (ref <150)

## 2023-08-11 LAB — HEMOGLOBIN A1C
Hgb A1c MFr Bld: 5.3 %{Hb} (ref <5.7)
Mean Plasma Glucose: 105 mg/dL
eAG (mmol/L): 5.8 mmol/L

## 2023-08-11 LAB — TSH: TSH: 2.66 m[IU]/L (ref 0.40–4.50)

## 2023-09-08 DIAGNOSIS — Z1231 Encounter for screening mammogram for malignant neoplasm of breast: Secondary | ICD-10-CM | POA: Diagnosis not present

## 2023-09-08 LAB — HM MAMMOGRAPHY

## 2023-09-13 DIAGNOSIS — H25813 Combined forms of age-related cataract, bilateral: Secondary | ICD-10-CM | POA: Diagnosis not present

## 2023-09-13 DIAGNOSIS — H35371 Puckering of macula, right eye: Secondary | ICD-10-CM | POA: Diagnosis not present

## 2023-09-13 DIAGNOSIS — H18593 Other hereditary corneal dystrophies, bilateral: Secondary | ICD-10-CM | POA: Diagnosis not present

## 2023-09-13 DIAGNOSIS — H04123 Dry eye syndrome of bilateral lacrimal glands: Secondary | ICD-10-CM | POA: Diagnosis not present

## 2023-09-15 ENCOUNTER — Encounter: Payer: Self-pay | Admitting: Internal Medicine

## 2023-09-23 ENCOUNTER — Other Ambulatory Visit: Payer: Self-pay | Admitting: Nurse Practitioner

## 2023-09-23 DIAGNOSIS — E063 Autoimmune thyroiditis: Secondary | ICD-10-CM

## 2023-09-27 DIAGNOSIS — H25812 Combined forms of age-related cataract, left eye: Secondary | ICD-10-CM | POA: Diagnosis not present

## 2023-10-05 DIAGNOSIS — H25812 Combined forms of age-related cataract, left eye: Secondary | ICD-10-CM | POA: Diagnosis not present

## 2023-10-07 ENCOUNTER — Encounter: Payer: Medicare HMO | Admitting: Nurse Practitioner

## 2023-10-14 DIAGNOSIS — H25811 Combined forms of age-related cataract, right eye: Secondary | ICD-10-CM | POA: Diagnosis not present

## 2023-10-26 DIAGNOSIS — H25811 Combined forms of age-related cataract, right eye: Secondary | ICD-10-CM | POA: Diagnosis not present

## 2023-11-15 NOTE — Progress Notes (Signed)
COMPLETE PHYSICAL   Assessment and Plan:  Encounter for routine medical examination w/ abnormal findings Yearly  Hyperlipidemia, unspecified hyperlipidemia type Continue Rosuvastatin 10mg  daily -     Lipid panel decrease fatty foods increase activity.   Essential hypertension Controlled with lifestyle Monitor blood pressure at home; call if consistently over 130/80 Continue DASH diet.   Reminder to go to the ER if any CP, SOB, nausea, dizziness, severe HA, changes vision/speech, left arm numbness and tingling and jaw pain. -     CBC with Differential/Platelet -     COMPLETE METABOLIC PANEL WITH GFR -     Urinalysis, Routine w reflex microscopic -     Microalbumin / creatinine urine ratio -     EKG 12-Lead   Hypothyroidism due to Hashimoto's thyroiditis Taking levothyroxine 50 mcg every day  Reminder to take on an empty stomach 30-45mins before first meal of the day. No antacid medications for 4 hours. -     TSH  Osteopenia, left neck femur DEXA scheduled for next year with mammogram  Abnormal glucose Continue diet and exercise A1c  BMI 20 She has lost 41 pounds since last year with weight watchers Stressed importance of fresh fruits and vegetables, fiber, water, lean protein  Vitamin D deficiency Continue supplementation to maintain goal of 70-100 Taking Vitamin D 5,000 IU daily -     VITAMIN D 25 Hydroxy (Vit-D Deficiency, Fractures)  Allergic rhinitis, unspecified seasonality, unspecified trigger - Allegra OTC, increase H20, allergy hygiene explained.  Stage 2 CKD Increase fluids  Avoid NSAIDS Blood pressure control Monitor sugars  Will continue to monitor  Screening for ischemic heart disease EKG  Screening for AAA - ABD U/S Retroperitoneal LTD  Flu Vaccine need - High dose flu vaccine given  Screening for hematuria/proteinuria - routine UA with reflex microscopic - Microalbumin/creatinine urine ratio  Medication management -     CBC with  Differential/Platelet -     COMPLETE METABOLIC PANEL WITH GFR -     Lipid panel -     Magnesium -     TSH -     Hemoglobin A1C w/out eAG -     VITAMIN D 25 Hydroxy (Vit-D Deficiency, Fractures) -     EKG 12-Lead -     [place], RETROPERITNL ABD,  LTD -     Urinalysis, Routine w reflex microscopic -     Microalbumin / creatinine urine ratio  Flu vaccine need -     Flu vaccine HIGH DOSE PF(Fluzone Trivalent)     Further disposition pending results if labs check today. Discussed med's effects and SE's.   Over 40 minutes of face to face interview, exam, counseling, chart review, and critical decision making was performed.    Future Appointments  Date Time Provider Department Center  11/15/2024  9:00 AM Raynelle Dick, NP [facility] None     HPI  67 y.o. female  presents for a Complete Physical. has Hyperlipidemia; Vitamin D deficiency; Allergic rhinitis; History of basal cell carcinoma; HTN (hypertension); Hypothyroidism; Abnormal glucose (hx of prediabetes); and History of colon polyps on their problem list.   She has been using Metamucil daily for constipation and it is well controlled with this regimen.  Cataract removed 11/6 and 10/26/23. She did have lenses implanted and is currently finishing drops  Currently on Levothyroxine 50 mcg every day. Last TSH was Lab Results  Component Value Date   TSH 2.66 08/10/2023   BMI is Body mass index is 20.66 kg/m.,  she has been working on diet and exercise. She is not exercising and is limiting sugar and simple carbs- following weight watchers and has lost 41 pounds since last year Wt Readings from Last 3 Encounters:  11/16/23 114 lb 12.8 oz (52.1 kg)  08/10/23 118 lb 3.2 oz (53.6 kg)  04/28/23 126 lb 3.2 oz (57.2 kg)    Her blood pressure has been controlled at home without medication, today their BP is BP: 100/62.  BP Readings from Last 3 Encounters:  11/16/23 100/62  08/10/23 104/64  04/28/23 104/62   She does workout, 1 hour  daily walking. She denies chest pain, shortness of breath, dizziness.     She is on cholesterol medication, crestor 10mg  and denies myalgias. Her cholesterol is at goal. The cholesterol last visit was:  Lab Results  Component Value Date   CHOL 136 08/10/2023   HDL 65 08/10/2023   LDLCALC 57 08/10/2023   TRIG 63 08/10/2023   CHOLHDL 2.1 08/10/2023  . She has been working on diet and exercise for prediabetes, . Last A1C in the office was:  Lab Results  Component Value Date   HGBA1C 5.3 08/10/2023   Patient is on Vitamin D supplement, 1500 IU a day.   Lab Results  Component Value Date   VD25OH 110 (H) 04/28/2023       Had mammogram [facility] 2024- negative repeat 1 year  Current Medications:   Current Outpatient Medications (Endocrine & Metabolic):    levothyroxine (SYNTHROID) 50 MCG tablet, TAKE 1 TABLET BY MOUTH DAILY BEFORE BREAKFAST  Current Outpatient Medications (Cardiovascular):    rosuvastatin (CRESTOR) 10 MG tablet, TAKE 1 TABLET BY MOUTH EVERY DAY FOR CHOLESTEROL  Current Outpatient Medications (Respiratory):    cetirizine (ZYRTEC) 10 MG tablet, Take 10 mg by mouth daily.    Current Outpatient Medications (Other):    Cholecalciferol (VITAMIN D3) 125 MCG (5000 UT) CAPS, Take by mouth daily.   METAMUCIL FIBER PO, Take by mouth.   Magnesium Hydroxide (MILK OF MAGNESIA PO), Take by mouth. PRN (Patient not taking: Reported on 11/16/2023)  Health Maintenance:   Immunization History  Administered Date(s) Administered   Influenza Inj Mdck Quad With Preservative 09/06/2017, 09/12/2018   Influenza Whole 09/02/2013   Influenza, High Dose Seasonal PF 08/31/2019, 10/03/2020, 08/27/2021, 10/06/2022   Influenza, Seasonal, Injecte, Preservative Fre 11/08/2015   Influenza-Unspecified 07/29/2016   Moderna SARS-COV2 Booster Vaccination 08/27/2021   PFIZER(Purple Top)SARS-COV-2 Vaccination 01/02/2020, 02/01/2020   PPD Test 04/24/2014   Pneumococcal Conjugate-13 05/30/2019    Pneumococcal Polysaccharide-23 04/13/2013   Tdap 10/16/2008, 09/06/2017   Zoster Recombinant(Shingrix) 08/31/2019, 02/21/2020   Health Maintenance  Topic Date Due   COVID-19 Vaccine (3 - Pfizer risk series) 09/24/2021   DEXA SCAN  08/21/2022   INFLUENZA VACCINE  07/01/2023   Medicare Annual Wellness (AWV)  04/27/2024   MAMMOGRAM  09/07/2024   DTaP/Tdap/Td (3 - Td or Tdap) 09/07/2027   Colonoscopy  02/26/2031   Hepatitis C Screening  Completed   Zoster Vaccines- Shingrix  Completed   HPV VACCINES  Aged Out   Pneumonia Vaccine 68+ Years old  Discontinued     Patient Care Team: Lucky Cowboy, MD as PCP - General (Internal Medicine) Hilarie Fredrickson, MD as Consulting Physician (Gastroenterology) Arminda Resides, MD as Consulting Physician (Dermatology) Drema Halon, MD (Inactive) as Consulting Physician (Otolaryngology) Huel Cote, MD as Consulting Physician (Obstetrics and Gynecology)  Medical History:  Past Medical History:  Diagnosis Date   Allergic rhinitis  Allergy    seasonal allergies   Basal cell carcinoma    Cataract    LEFT-not a surgical candidate at this time (02/11/2021)   Hyperlipidemia    on meds   Hypothyroidism    PONV (postoperative nausea and vomiting)    Thyroid disease    on meds   Vitamin D deficiency    Allergies No Known Allergies  SURGICAL HISTORY She  has a past surgical history that includes Tympanoplasty (Left); Tympanoplasty (Right, 06/07/2020); Wisdom tooth extraction; Colonoscopy (2011); and Colonoscopy (02/25/2021). FAMILY HISTORY Her family history includes Cancer in her maternal uncle; Cancer (age of onset: 44) in her sister; Heart attack in her mother; Heart disease in her mother; Hyperlipidemia in her mother; Hypertension in her mother; Varicose Veins in her sister. SOCIAL HISTORY She  reports that she has never smoked. She has never used smokeless tobacco. She reports that she does not drink alcohol and does not use  drugs.   Review of Systems: Review of Systems  Constitutional:  Negative for chills, fever and malaise/fatigue.  HENT:  Negative for congestion, ear pain, hearing loss, sinus pain, sore throat and tinnitus.   Eyes: Negative.  Negative for blurred vision and double vision.  Respiratory:  Negative for cough, hemoptysis, sputum production, shortness of breath and wheezing.   Cardiovascular:  Negative for chest pain, palpitations and leg swelling.  Gastrointestinal:  Negative for abdominal pain, blood in stool, constipation, diarrhea, heartburn, melena, nausea and vomiting.  Genitourinary: Negative.  Negative for dysuria and urgency.  Musculoskeletal:  Negative for back pain, falls, joint pain, myalgias and neck pain.  Skin: Negative.  Negative for rash.  Neurological:  Negative for dizziness, tingling, tremors, sensory change, loss of consciousness, weakness and headaches.  Endo/Heme/Allergies:  Does not bruise/bleed easily.  Psychiatric/Behavioral:  Negative for depression and suicidal ideas. The patient is not nervous/anxious and does not have insomnia.     Physical Exam: Estimated body mass index is 20.66 kg/m as calculated from the following:   Height as of this encounter: 5' 2.5" (1.588 m).   Weight as of this encounter: 114 lb 12.8 oz (52.1 kg). BP 100/62   Pulse 77   Temp 97.7 F (36.5 C)   Ht 5' 2.5" (1.588 m)   Wt 114 lb 12.8 oz (52.1 kg)   SpO2 97%   BMI 20.66 kg/m   General Appearance: Well nourished well developed, in no apparent distress.  Eyes: PERRLA, EOMs, conjunctiva no swelling or erythema ENT/Mouth: Ear canals normal without obstruction, swelling, erythema, or discharge.  TMs normal bilaterally with no erythema, bulging, retraction, or loss of landmark.  Oropharynx moist and clear with no exudate, erythema, or swelling.   Neck: Supple, thyroid normal. No bruits.  No cervical adenopathy Respiratory: Respiratory effort normal, Breath sounds clear A&P without  wheeze, rhonchi, rales.   Cardio: RRR without murmurs, rubs or gallops. Brisk peripheral pulses without edema.  Chest: symmetric, with normal excursions Breasts: Defer, UTD on mammograms and negative Abdomen: Soft, nontender, no guarding, rebound, hernias, masses, or organomegaly.  Lymphatics: Non tender without lymphadenopathy.  Musculoskeletal: Full ROM all peripheral extremities,5/5 strength, and normal gait. Unable to reproduce pain of left ankle Skin: Warm, dry without rashes, lesions, ecchymosis. Neuro: Awake and oriented X 3, Cranial nerves intact, reflexes equal bilaterally. Normal muscle tone, no cerebellar symptoms. Sensation intact.  Psych:  normal affect, Insight and Judgment appropriate. Pelvic: defer, no issues  EKG: NSR, no ST changes, IRBBB AAA: < 3 cm  Revonda Humphrey  ANP-C  [facility]  11/16/2023

## 2023-11-16 ENCOUNTER — Encounter: Payer: Self-pay | Admitting: Nurse Practitioner

## 2023-11-16 ENCOUNTER — Ambulatory Visit (INDEPENDENT_AMBULATORY_CARE_PROVIDER_SITE_OTHER): Payer: Medicare HMO | Admitting: Nurse Practitioner

## 2023-11-16 VITALS — BP 100/62 | HR 77 | Temp 97.7°F | Ht 62.5 in | Wt 114.8 lb

## 2023-11-16 DIAGNOSIS — Z23 Encounter for immunization: Secondary | ICD-10-CM

## 2023-11-16 DIAGNOSIS — Z0000 Encounter for general adult medical examination without abnormal findings: Secondary | ICD-10-CM

## 2023-11-16 DIAGNOSIS — Z0001 Encounter for general adult medical examination with abnormal findings: Secondary | ICD-10-CM

## 2023-11-16 DIAGNOSIS — J309 Allergic rhinitis, unspecified: Secondary | ICD-10-CM

## 2023-11-16 DIAGNOSIS — Z79899 Other long term (current) drug therapy: Secondary | ICD-10-CM

## 2023-11-16 DIAGNOSIS — E063 Autoimmune thyroiditis: Secondary | ICD-10-CM

## 2023-11-16 DIAGNOSIS — E785 Hyperlipidemia, unspecified: Secondary | ICD-10-CM

## 2023-11-16 DIAGNOSIS — I700 Atherosclerosis of aorta: Secondary | ICD-10-CM | POA: Diagnosis not present

## 2023-11-16 DIAGNOSIS — M8580 Other specified disorders of bone density and structure, unspecified site: Secondary | ICD-10-CM

## 2023-11-16 DIAGNOSIS — E559 Vitamin D deficiency, unspecified: Secondary | ICD-10-CM

## 2023-11-16 DIAGNOSIS — Z1389 Encounter for screening for other disorder: Secondary | ICD-10-CM

## 2023-11-16 DIAGNOSIS — R7309 Other abnormal glucose: Secondary | ICD-10-CM

## 2023-11-16 DIAGNOSIS — I10 Essential (primary) hypertension: Secondary | ICD-10-CM | POA: Diagnosis not present

## 2023-11-16 DIAGNOSIS — Z136 Encounter for screening for cardiovascular disorders: Secondary | ICD-10-CM

## 2023-11-16 DIAGNOSIS — N182 Chronic kidney disease, stage 2 (mild): Secondary | ICD-10-CM

## 2023-11-16 DIAGNOSIS — Z6820 Body mass index (BMI) 20.0-20.9, adult: Secondary | ICD-10-CM

## 2023-11-16 DIAGNOSIS — Z6827 Body mass index (BMI) 27.0-27.9, adult: Secondary | ICD-10-CM

## 2023-11-16 NOTE — Patient Instructions (Signed)

## 2023-11-17 ENCOUNTER — Other Ambulatory Visit: Payer: Self-pay | Admitting: Nurse Practitioner

## 2023-11-17 DIAGNOSIS — E063 Autoimmune thyroiditis: Secondary | ICD-10-CM

## 2023-11-17 LAB — CBC WITH DIFFERENTIAL/PLATELET
Absolute Lymphocytes: 1287 {cells}/uL (ref 850–3900)
Absolute Monocytes: 329 {cells}/uL (ref 200–950)
Basophils Absolute: 50 {cells}/uL (ref 0–200)
Basophils Relative: 1.1 %
Eosinophils Absolute: 212 {cells}/uL (ref 15–500)
Eosinophils Relative: 4.7 %
HCT: 34.9 % — ABNORMAL LOW (ref 35.0–45.0)
Hemoglobin: 11.4 g/dL — ABNORMAL LOW (ref 11.7–15.5)
MCH: 32.6 pg (ref 27.0–33.0)
MCHC: 32.7 g/dL (ref 32.0–36.0)
MCV: 99.7 fL (ref 80.0–100.0)
MPV: 11.2 fL (ref 7.5–12.5)
Monocytes Relative: 7.3 %
Neutro Abs: 2624 {cells}/uL (ref 1500–7800)
Neutrophils Relative %: 58.3 %
Platelets: 149 10*3/uL (ref 140–400)
RBC: 3.5 10*6/uL — ABNORMAL LOW (ref 3.80–5.10)
RDW: 11.7 % (ref 11.0–15.0)
Total Lymphocyte: 28.6 %
WBC: 4.5 10*3/uL (ref 3.8–10.8)

## 2023-11-17 LAB — COMPLETE METABOLIC PANEL WITH GFR
AG Ratio: 1.5 (calc) (ref 1.0–2.5)
ALT: 27 U/L (ref 6–29)
AST: 25 U/L (ref 10–35)
Albumin: 3.7 g/dL (ref 3.6–5.1)
Alkaline phosphatase (APISO): 58 U/L (ref 37–153)
BUN: 13 mg/dL (ref 7–25)
CO2: 31 mmol/L (ref 20–32)
Calcium: 9 mg/dL (ref 8.6–10.4)
Chloride: 106 mmol/L (ref 98–110)
Creat: 0.95 mg/dL (ref 0.50–1.05)
Globulin: 2.5 g/dL (ref 1.9–3.7)
Glucose, Bld: 79 mg/dL (ref 65–99)
Potassium: 4.2 mmol/L (ref 3.5–5.3)
Sodium: 141 mmol/L (ref 135–146)
Total Bilirubin: 0.4 mg/dL (ref 0.2–1.2)
Total Protein: 6.2 g/dL (ref 6.1–8.1)
eGFR: 65 mL/min/{1.73_m2} (ref >=60)

## 2023-11-17 LAB — TSH: TSH: 8.81 m[IU]/L — ABNORMAL HIGH (ref 0.40–4.50)

## 2023-11-17 LAB — URINALYSIS, ROUTINE W REFLEX MICROSCOPIC
Bacteria, UA: NONE SEEN /[HPF]
Bilirubin Urine: NEGATIVE
Glucose, UA: NEGATIVE
Hgb urine dipstick: NEGATIVE
Hyaline Cast: NONE SEEN /[LPF]
Ketones, ur: NEGATIVE
Nitrite: NEGATIVE
Protein, ur: NEGATIVE
RBC / HPF: NONE SEEN /[HPF] (ref 0–2)
Specific Gravity, Urine: 1.012 (ref 1.001–1.035)
Squamous Epithelial / HPF: NONE SEEN /[HPF] (ref <=5)
pH: 6.5 (ref 5.0–8.0)

## 2023-11-17 LAB — LIPID PANEL
Cholesterol: 155 mg/dL (ref <200)
HDL: 82 mg/dL (ref >=50)
LDL Cholesterol (Calc): 63 mg/dL
Non-HDL Cholesterol (Calc): 73 mg/dL (ref <130)
Total CHOL/HDL Ratio: 1.9 (calc) (ref <5.0)
Triglycerides: 35 mg/dL (ref <150)

## 2023-11-17 LAB — MICROALBUMIN / CREATININE URINE RATIO
Creatinine, Urine: 60 mg/dL (ref 20–275)
Microalb Creat Ratio: 5 mg/g{creat} (ref <30)
Microalb, Ur: 0.3 mg/dL

## 2023-11-17 LAB — HEMOGLOBIN A1C W/OUT EAG: Hgb A1c MFr Bld: 5.2 %{Hb} (ref <5.7)

## 2023-11-17 LAB — MICROSCOPIC MESSAGE

## 2023-11-17 LAB — VITAMIN D 25 HYDROXY (VIT D DEFICIENCY, FRACTURES): Vit D, 25-Hydroxy: 85 ng/mL (ref 30–100)

## 2023-11-17 LAB — MAGNESIUM: Magnesium: 2.1 mg/dL (ref 1.5–2.5)

## 2023-11-17 MED ORDER — LEVOTHYROXINE SODIUM 75 MCG PO TABS: 75.0000 ug | ORAL_TABLET | Freq: Every day | ORAL | 6 refills | Status: DC

## 2023-12-07 DIAGNOSIS — Z85828 Personal history of other malignant neoplasm of skin: Secondary | ICD-10-CM | POA: Diagnosis not present

## 2023-12-07 DIAGNOSIS — L820 Inflamed seborrheic keratosis: Secondary | ICD-10-CM | POA: Diagnosis not present

## 2023-12-07 DIAGNOSIS — C44622 Squamous cell carcinoma of skin of right upper limb, including shoulder: Secondary | ICD-10-CM | POA: Diagnosis not present

## 2023-12-07 DIAGNOSIS — D485 Neoplasm of uncertain behavior of skin: Secondary | ICD-10-CM | POA: Diagnosis not present

## 2023-12-30 ENCOUNTER — Other Ambulatory Visit: Payer: Self-pay | Admitting: Nurse Practitioner

## 2023-12-30 ENCOUNTER — Encounter: Payer: Self-pay | Admitting: Nurse Practitioner

## 2024-01-01 ENCOUNTER — Emergency Department (HOSPITAL_BASED_OUTPATIENT_CLINIC_OR_DEPARTMENT_OTHER)
Admission: EM | Admit: 2024-01-01 | Discharge: 2024-01-01 | Disposition: A | Discharge status: Home / Self Care | Payer: HMO | Attending: Emergency Medicine | Admitting: Emergency Medicine

## 2024-01-01 ENCOUNTER — Other Ambulatory Visit: Payer: Self-pay

## 2024-01-01 ENCOUNTER — Emergency Department (HOSPITAL_BASED_OUTPATIENT_CLINIC_OR_DEPARTMENT_OTHER): Payer: HMO | Admitting: Radiology

## 2024-01-01 ENCOUNTER — Encounter (HOSPITAL_BASED_OUTPATIENT_CLINIC_OR_DEPARTMENT_OTHER): Payer: Self-pay

## 2024-01-01 ENCOUNTER — Emergency Department (HOSPITAL_BASED_OUTPATIENT_CLINIC_OR_DEPARTMENT_OTHER): Payer: HMO

## 2024-01-01 ENCOUNTER — Emergency Department (HOSPITAL_BASED_OUTPATIENT_CLINIC_OR_DEPARTMENT_OTHER): Payer: Self-pay

## 2024-01-01 DIAGNOSIS — S52531A Colles' fracture of right radius, initial encounter for closed fracture: Secondary | ICD-10-CM | POA: Insufficient documentation

## 2024-01-01 DIAGNOSIS — M25531 Pain in right wrist: Secondary | ICD-10-CM | POA: Diagnosis present

## 2024-01-01 DIAGNOSIS — W1839XA Other fall on same level, initial encounter: Secondary | ICD-10-CM | POA: Insufficient documentation

## 2024-01-01 DIAGNOSIS — S52044A Nondisplaced fracture of coronoid process of right ulna, initial encounter for closed fracture: Secondary | ICD-10-CM

## 2024-01-01 DIAGNOSIS — S52691A Other fracture of lower end of right ulna, initial encounter for closed fracture: Secondary | ICD-10-CM

## 2024-01-01 DIAGNOSIS — S52611A Displaced fracture of right ulna styloid process, initial encounter for closed fracture: Secondary | ICD-10-CM | POA: Insufficient documentation

## 2024-01-01 MED ORDER — LIDOCAINE HCL (PF) 1 % IJ SOLN
30.0000 mL | Freq: Once | INTRAMUSCULAR | Status: AC
  Administered 2024-01-01: 5 mL
  Filled 2024-01-01: qty 30

## 2024-01-01 NOTE — ED Notes (Signed)
Went to room 16 for xray, pt wasn't in there. Called for pt in waiting room and no answer. Will return shortly

## 2024-01-01 NOTE — ED Provider Notes (Signed)
   Procedures .Reduction of fracture  Date/Time: 01/01/2024 4:47 PM  Performed by: Arletha Pili, DO Authorized by: Arletha Pili, DO  Consent: Verbal consent obtained. Consent given by: patient Patient understanding: patient states understanding of the procedure being performed Patient consent: the patient's understanding of the procedure matches consent given Patient identity confirmed: verbally with patient Local anesthesia used: no  Anesthesia: Local anesthesia used: no  Sedation: Patient sedated: no  Comments: Patient with a right distal radius fracture.  Patient was able to tolerate fingertrap reduction, allowed gravity reduction for 10 minutes.  Using plaster, I personally placed patient in a radial gutter splint.  Plain films obtained after reduction.  Did show improved position of impacted fracture.  Follow-up with hand surgery.  Placed in sling.  Neurovascularly intact following procedure.         Anders Simmonds T, DO 01/01/24 [id number]

## 2024-01-01 NOTE — Discharge Instructions (Addendum)
Please call to make an appointment with orthopedics and follow-up closely with them.  Return to emergency department immediately for any new or worsening symptoms.

## 2024-01-01 NOTE — ED Provider Notes (Signed)
[place] [facility]ERGENCY DEPARTMENT AT [facility] Provider Note   CSN: [id number] Arrival date & time: 01/01/24  1348     History  Chief Complaint  Patient presents with   Wrist Pain    right   Fall    Tracy Brady is a 68 y.o. female.  Patient is a 68 year old female who presents to the emergency department with a chief complaint of pain to the right forearm and wrist.  Patient notes that she was playing pickle ball just prior to arrival when she had a mechanical fall directly onto a right outstretched arm.  Patient denies striking her head or injuring her neck or back during the fall.  She denies any history of known bleeding disorders or current anticoagulation use.  She denies any associated numbness or paresthesias.  She denies any other long bone or joint pain.  She denies any chest pain, abdominal pain.  She notes that the fall was purely mechanical in nature with no preceding dizziness, lightheadedness, syncope, chest pain, palpitations.   Wrist Pain  Fall       Home Medications Prior to Admission medications   Medication Sig Start Date End Date Taking? Authorizing Provider  cetirizine (ZYRTEC) 10 MG tablet Take 10 mg by mouth daily.    [provider]  Cholecalciferol (VITAMIN D3) 125 MCG (5000 UT) CAPS Take by mouth daily.    [provider]  levothyroxine (SYNTHROID) 75 MCG tablet Take 1 tablet (75 mcg total) by mouth daily before breakfast. 11/17/23   Raynelle Dick, NP  METAMUCIL FIBER PO Take by mouth.    [provider]  rosuvastatin (CRESTOR) 10 MG tablet TAKE 1 TABLET BY MOUTH EVERY DAY FOR CHOLESTEROL 12/30/23   Adela Glimpse, NP      Allergies    Patient has no known allergies.    Review of Systems   Review of Systems  Musculoskeletal:  Positive for joint swelling.  All other systems reviewed and are negative.   Physical Exam Updated Vital Signs BP 127/75 (BP Location: Left Arm)   Pulse 81   Temp 97.9 F  (36.6 C) (Temporal)   Resp 20   Ht 5\' 3"  (1.6 m)   Wt 54.4 kg   SpO2 100%   BMI 21.26 kg/m  Physical Exam Constitutional:      Appearance: Normal appearance.  HENT:     Head: Normocephalic and atraumatic.     Nose: Nose normal.     Mouth/Throat:     Mouth: Mucous membranes are moist.  Eyes:     Extraocular Movements: Extraocular movements intact.     Conjunctiva/sclera: Conjunctivae normal.     Pupils: Pupils are equal, round, and reactive to light.  Cardiovascular:     Rate and Rhythm: Normal rate and regular rhythm.     Pulses: Normal pulses.     Heart sounds: Normal heart sounds.  Pulmonary:     Effort: Pulmonary effort is normal.  Musculoskeletal:        General: Normal range of motion.     Cervical back: Normal range of motion and neck supple.     Comments: Tenderness palpation over the dorsal aspect of the right wrist and forearm, mild swelling noted over the dorsal aspect of the right forearm, nontender palpation directly over right elbow, humerus, shoulder, nontender palpation of her right hand, no snuffbox tenderness, radial pulses 2+ distally and cap refill less than 2 seconds distally, radial, ulnar, median nerve function intact distally, full range  of motion noted throughout, no obvious deformity or bruising, no skin breakdown or ulceration, no lacerations or abrasions Nontender palpation over cervical, thoracic, lumbar spine, no step-off or deformity  Skin:    General: Skin is warm and dry.  Neurological:     General: No focal deficit present.     Mental Status: She is alert and oriented to person, place, and time. Mental status is at baseline.  Psychiatric:        Mood and Affect: Mood normal.        Behavior: Behavior normal.        Thought Content: Thought content normal.        Judgment: Judgment normal.     ED Results / Procedures / Treatments   Labs (all labs ordered are listed, but only abnormal results are displayed) Labs Reviewed - No data to  display  EKG None  Radiology No results found.  Procedures Procedures    Medications Ordered in ED Medications - No data to display  ED Course/ Medical Decision Making/ A&P                                 Medical Decision Making Patient is doing well at this time and is stable for discharge home.  Patient does have a distal radius and ulnar fracture as well as a fracture to the coronoid process.  Reduction was performed by my attending physician and patient was placed in a splint.  Patient's pain is well-controlled at this time and she was neurovascularly intact before and after splint placement distally.  She had no other secondary signs of injury or pain noted on physical exam and she denies strike her head during the fall.  Patient was directed to follow-up closely with orthopedics on an outpatient basis.  Strict turn precautions were provided for any new or worsening symptoms.  Patient voiced understanding and had no additional questions.  Amount and/or Complexity of Data Reviewed Radiology: ordered.  Risk Prescription drug management.           Final Clinical Impression(s) / ED Diagnoses Final diagnoses:  None    Rx / DC Orders ED Discharge Orders     None         Lelon Perla, PA-C 01/01/24 1658    Anders Simmonds T, DO 01/04/24 0100

## 2024-01-01 NOTE — ED Triage Notes (Addendum)
Patient arrives with complaints of right wrist pain due to falling. Patient had a mechanical fall, landing on her right hand. Now having 6/10 pain in her right wrist. No head injuries.

## 2024-01-19 ENCOUNTER — Other Ambulatory Visit: Payer: Self-pay

## 2024-01-19 ENCOUNTER — Encounter: Payer: Self-pay | Admitting: Internal Medicine

## 2024-01-19 DIAGNOSIS — E063 Autoimmune thyroiditis: Secondary | ICD-10-CM

## 2024-01-19 MED ORDER — LEVOTHYROXINE SODIUM 75 MCG PO TABS: 75.0000 ug | ORAL_TABLET | Freq: Every day | ORAL | 3 refills | Status: DC

## 2024-01-24 ENCOUNTER — Other Ambulatory Visit: Payer: Self-pay | Admitting: Family

## 2024-01-24 DIAGNOSIS — E063 Autoimmune thyroiditis: Secondary | ICD-10-CM

## 2024-03-27 ENCOUNTER — Ambulatory Visit (INDEPENDENT_AMBULATORY_CARE_PROVIDER_SITE_OTHER): Payer: HMO | Admitting: Family Medicine

## 2024-03-27 ENCOUNTER — Encounter: Payer: Self-pay | Admitting: Family Medicine

## 2024-03-27 VITALS — BP 126/78 | HR 81 | Ht 63.0 in | Wt 131.0 lb

## 2024-03-27 DIAGNOSIS — E559 Vitamin D deficiency, unspecified: Secondary | ICD-10-CM | POA: Diagnosis not present

## 2024-03-27 DIAGNOSIS — Z780 Asymptomatic menopausal state: Secondary | ICD-10-CM

## 2024-03-27 DIAGNOSIS — I10 Essential (primary) hypertension: Secondary | ICD-10-CM

## 2024-03-27 DIAGNOSIS — E785 Hyperlipidemia, unspecified: Secondary | ICD-10-CM | POA: Diagnosis not present

## 2024-03-27 DIAGNOSIS — J309 Allergic rhinitis, unspecified: Secondary | ICD-10-CM

## 2024-03-27 DIAGNOSIS — E063 Autoimmune thyroiditis: Secondary | ICD-10-CM | POA: Diagnosis not present

## 2024-03-27 DIAGNOSIS — Z13820 Encounter for screening for osteoporosis: Secondary | ICD-10-CM | POA: Diagnosis not present

## 2024-03-27 DIAGNOSIS — Z7689 Persons encountering health services in other specified circumstances: Secondary | ICD-10-CM | POA: Insufficient documentation

## 2024-03-27 LAB — TSH: TSH: 1 m[IU]/L (ref 0.40–4.50)

## 2024-03-27 NOTE — Assessment & Plan Note (Signed)
 Continue Vitamin D3 5000 UT

## 2024-03-27 NOTE — Assessment & Plan Note (Addendum)
 Chronic well controlled. Recommend heart healthy diet such as Mediterranean diet with whole grains, fruits, vegetable, fish, lean meats, nuts, and olive oil. Limit salt. Encouraged moderate walking, 3-5 times/week for 30-50 minutes each session. Aim for at least 150 minutes.week. Goal should be pace of 3 miles/hours, or walking 1.5 miles in 30 minutes. Avoid tobacco products. Avoid excess alcohol . Take medications as prescribed and bring medications and blood pressure log with cuff to each office visit. Seek medical care for chest pain, palpitations, shortness of breath with exertion, dizziness/lightheadedness, vision changes, recurrent headaches, or swelling of extremities.

## 2024-03-27 NOTE — Assessment & Plan Note (Signed)
 Continue Rosuvastatin . I recommend consuming a heart healthy diet such as Mediterranean diet or DASH diet with whole grains, fruits, vegetable, fish, lean meats, nuts, and olive oil. Limit sweets and processed foods. I also encourage moderate intensity exercise 150 minutes weekly. This is 3-5 times weekly for 30-50 minutes each session. Goal should be pace of 3 miles/hours, or walking 1.5 miles in 30 minutes. The 10-year ASCVD risk score (Arnett DK, et al., 2019) is: 6.9%

## 2024-03-27 NOTE — Assessment & Plan Note (Signed)
 Well controlled. Would like to trial off Zyrtec.

## 2024-03-27 NOTE — Assessment & Plan Note (Signed)
 Last TSH 8.81, synthroid  increased to 75mcg 4 months ago. Euthyroid on exam. Repeat TSH today.

## 2024-03-27 NOTE — Patient Instructions (Signed)
 It was great to meet you today and I'm excited to have you join the [facility] practice. I hope you had a positive experience today! If you feel so inclined, please feel free to recommend our practice to friends and family. Kurtis Bushman, FNP-C

## 2024-03-27 NOTE — Assessment & Plan Note (Signed)
 Today we reviewed our medical history, health maintenance items, and current concerns. I have ordered a DEXA scan to evaluate for osteoporosis. TSH drawn and will adjust Synthroid  as indicated. Return to my office in 3 months for routine follow up with labs week before.

## 2024-03-27 NOTE — Progress Notes (Signed)
 New Patient Office Visit  Subjective    Patient ID: Tracy Brady, female    DOB: 04-21-1956  Age: 68 y.o. MRN: [id number]  CC:  Chief Complaint  Patient presents with   Establish Care    HPI Tracy Brady presents to establish care. Oriented to practice routines and expectations. Has been seeing PCP regularly. PMH includes HLD, Hypothyroidism, allergies, BCC, vitamin D  deficiency, cataracts. Chronic conditions well controlled. She denies history of uncontrolled BP, was 100/62 at her last PCP visit, elevated 2/1 in ED visit, home readings <130/90. Has completed PT for right arm fracture.   HYPOTHYROIDISM Thyroid  control status:stable Satisfied with current treatment? yes Medication side effects: no Medication compliance: excellent compliance Etiology of hypothyroidism:  Recent dose adjustment:yes Fatigue: no Cold intolerance: no Heat intolerance: no Weight gain: no Weight loss: no Constipation: no Diarrhea/loose stools: no Palpitations: no Lower extremity edema: no Anxiety/depressed mood: no  HYPERLIPIDEMIA Hyperlipidemia status: excellent compliance Satisfied with current treatment?  yes Side effects:  no Medication compliance: excellent compliance Past cholesterol meds:  rosuvastatin  Supplements: none Aspirin:  no The 10-year ASCVD risk score (Arnett DK, et al., 2019) is: 6.9%   Values used to calculate the score:     Age: 40 years     Sex: Female     Is Non-Hispanic African American: No     Diabetic: No     Tobacco smoker: No     Systolic Blood Pressure: 126 mmHg     Is BP treated: No     HDL Cholesterol: 82 mg/dL     Total Cholesterol: 155 mg/dL Chest pain:  no Coronary artery disease:  no Family history CAD:  no Family history early CAD:  no   Outpatient Encounter Medications as of 03/27/2024  Medication Sig   cetirizine (ZYRTEC) 10 MG tablet Take 10 mg by mouth daily.   Cholecalciferol (VITAMIN D3) 125 MCG (5000 UT) CAPS Take by mouth daily.    levothyroxine  (SYNTHROID ) 75 MCG tablet Take 1 tablet (75 mcg total) by mouth daily before breakfast.   METAMUCIL FIBER PO Take by mouth.   rosuvastatin  (CRESTOR ) 10 MG tablet TAKE 1 TABLET BY MOUTH EVERY DAY FOR CHOLESTEROL   No facility-administered encounter medications on file as of 03/27/2024.    Past Medical History:  Diagnosis Date   Allergic rhinitis    Allergy    seasonal allergies   Basal cell carcinoma    Cataract    LEFT-not a surgical candidate at this time (02/11/2021)   Hyperlipidemia    on meds   Hypothyroidism    PONV (postoperative nausea and vomiting)    Thyroid  disease    on meds   Vitamin D  deficiency     Past Surgical History:  Procedure Laterality Date   COLONOSCOPY  2011   JP-F/V-moviprep(good)-normal   COLONOSCOPY  02/25/2021   TYMPANOPLASTY Left    TYMPANOPLASTY Right 06/07/2020   Procedure: TYMPANOPLASTY;  Surgeon: Prescott Brodie, MD;  Location: [facility];  Service: ENT;  Laterality: Right;   WISDOM TOOTH EXTRACTION      Family History  Problem Relation Age of Onset   Hypertension Mother    Heart disease Mother    Hyperlipidemia Mother    Heart attack Mother    Cancer Maternal Uncle        Melanoma, had eye removed   Varicose Veins Sister    Cancer Sister 62       breast cancer   Colon polyps Neg  Hx    Colon cancer Neg Hx    Esophageal cancer Neg Hx    Stomach cancer Neg Hx    Rectal cancer Neg Hx     Social History   Socioeconomic History   Marital status: Married    Spouse name: Not on file   Number of children: Not on file   Years of education: Not on file   Highest education level: GED or equivalent  Occupational History   Not on file  Tobacco Use   Smoking status: Never   Smokeless tobacco: Never  Vaping Use   Vaping status: Never Used  Substance and Sexual Activity   Alcohol use: No   Drug use: No   Sexual activity: Not on file  Other Topics Concern   Not on file  Social History Narrative    Not on file   Social Drivers of Health   Financial Resource Strain: Low Risk  (03/23/2024)   Overall Financial Resource Strain (CARDIA)    Difficulty of Paying Living Expenses: Not hard at all  Food Insecurity: No Food Insecurity (03/23/2024)   Hunger Vital Sign    Worried About Running Out of Food in the Last Year: Never true    Ran Out of Food in the Last Year: Never true  Transportation Needs: No Transportation Needs (03/23/2024)   PRAPARE - Administrator, Civil Service (Medical): No    Lack of Transportation (Non-Medical): No  Physical Activity: Unknown (03/23/2024)   Exercise Vital Sign    Days of Exercise per Week: 0 days    Minutes of Exercise per Session: Not on file  Stress: No Stress Concern Present (03/23/2024)   [employer] of Occupational Health - Occupational Stress Questionnaire    Feeling of Stress : Not at all  Social Connections: Moderately Integrated (03/23/2024)   Social Connection and Isolation Panel [NHANES]    Frequency of Communication with Friends and Family: Once a week    Frequency of Social Gatherings with Friends and Family: Once a week    Attends Religious Services: More than 4 times per year    Active Member of [employer] or Organizations: Yes    Attends Engineer, structural: More than 4 times per year    Marital Status: Married  Catering manager Violence: Not on file    Review of Systems  All other systems reviewed and are negative.       Objective    BP 126/78   Pulse 81   Ht 5\' 3"  (1.6 m)   Wt 131 lb (59.4 kg)   SpO2 99%   BMI 23.21 kg/m     03/27/2024   11:33 AM 03/27/2024   10:58 AM 01/01/2024    5:00 PM  Vitals with BMI  Height  5\' 3"    Weight  131 lbs   BMI  23.21   Systolic 126 138 119  Diastolic 78 90 72  Pulse  81 79     Physical Exam Vitals and nursing note reviewed.  Constitutional:      Appearance: Normal appearance. She is normal weight.  HENT:     Head: Normocephalic and atraumatic.   Cardiovascular:     Rate and Rhythm: Normal rate and regular rhythm.     Pulses: Normal pulses.     Heart sounds: Normal heart sounds.  Pulmonary:     Effort: Pulmonary effort is normal.     Breath sounds: Normal breath sounds.  Skin:    General: Skin  is warm and dry.  Neurological:     General: No focal deficit present.     Mental Status: She is alert and oriented to person, place, and time. Mental status is at baseline.  Psychiatric:        Mood and Affect: Mood normal.        Behavior: Behavior normal.        Thought Content: Thought content normal.        Judgment: Judgment normal.     Last CBC Lab Results  Component Value Date   WBC 4.5 11/16/2023   HGB 11.4 (L) 11/16/2023   HCT 34.9 (L) 11/16/2023   MCV 99.7 11/16/2023   MCH 32.6 11/16/2023   RDW 11.7 11/16/2023   PLT 149 11/16/2023   Last metabolic panel Lab Results  Component Value Date   GLUCOSE 79 11/16/2023   NA 141 11/16/2023   K 4.2 11/16/2023   CL 106 11/16/2023   CO2 31 11/16/2023   BUN 13 11/16/2023   CREATININE 0.95 11/16/2023   EGFR 65 11/16/2023   CALCIUM  9.0 11/16/2023   PROT 6.2 11/16/2023   ALBUMIN 4.0 01/26/2022   BILITOT 0.4 11/16/2023   ALKPHOS 47 01/26/2022   AST 25 11/16/2023   ALT 27 11/16/2023   ANIONGAP 11 01/26/2022   Last lipids Lab Results  Component Value Date   CHOL 155 11/16/2023   HDL 82 11/16/2023   LDLCALC 63 11/16/2023   TRIG 35 11/16/2023   CHOLHDL 1.9 11/16/2023   Last hemoglobin A1c Lab Results  Component Value Date   HGBA1C 5.2 11/16/2023   Last thyroid  functions Lab Results  Component Value Date   TSH 8.81 (H) 11/16/2023   Last vitamin D  Lab Results  Component Value Date   VD25OH 85 11/16/2023   Last vitamin B12 and Folate Lab Results  Component Value Date   VITAMINB12 363 10/03/2020        Assessment & Plan:   Problem List Items Addressed This Visit     Hyperlipidemia   Continue Rosuvastatin . I recommend consuming a heart healthy  diet such as Mediterranean diet or DASH diet with whole grains, fruits, vegetable, fish, lean meats, nuts, and olive oil. Limit sweets and processed foods. I also encourage moderate intensity exercise 150 minutes weekly. This is 3-5 times weekly for 30-50 minutes each session. Goal should be pace of 3 miles/hours, or walking 1.5 miles in 30 minutes. The 10-year ASCVD risk score (Arnett DK, et al., 2019) is: 6.9%       Vitamin D  deficiency   Continue Vitamin D3 5000 UT      Relevant Orders   DG Bone Density   Allergic rhinitis   Well controlled. Would like to trial off Zyrtec.      HTN (hypertension)   Chronic well controlled.  Recommend heart healthy diet such as Mediterranean diet with whole grains, fruits, vegetable, fish, lean meats, nuts, and olive oil. Limit salt. Encouraged moderate walking, 3-5 times/week for 30-50 minutes each session. Aim for at least 150 minutes.week. Goal should be pace of 3 miles/hours, or walking 1.5 miles in 30 minutes. Avoid tobacco products. Avoid excess alcohol. Take medications as prescribed and bring medications and blood pressure log with cuff to each office visit. Seek medical care for chest pain, palpitations, shortness of breath with exertion, dizziness/lightheadedness, vision changes, recurrent headaches, or swelling of extremities.       Hypothyroidism   Last TSH 8.81, synthroid  increased to 4 months ago. Euthyroid on  exam. Repeat TSH today.       Relevant Orders   TSH   Encounter to establish care with new provider - Primary   Today we reviewed our medical history, health maintenance items, and current concerns. I have ordered a DEXA scan to evaluate for osteoporosis. TSH drawn and will adjust Synthroid  as indicated. Return to my office in 3 months for routine follow up with labs week before.       Other Visit Diagnoses       Encounter for osteoporosis screening in asymptomatic postmenopausal patient       Relevant Orders   DG Bone  Density       Return in about 3 months (around 06/26/2024) for ASAP AWV, 64mo chronic follow-up with labs 1 week prior.   Jenelle Mis, FNP

## 2024-03-28 ENCOUNTER — Encounter: Payer: Self-pay | Admitting: Family Medicine

## 2024-05-03 ENCOUNTER — Ambulatory Visit

## 2024-05-03 ENCOUNTER — Ambulatory Visit: Payer: Medicare HMO | Admitting: Internal Medicine

## 2024-06-07 ENCOUNTER — Ambulatory Visit

## 2024-06-07 VITALS — Ht 63.0 in | Wt 131.0 lb

## 2024-06-07 DIAGNOSIS — Z0000 Encounter for general adult medical examination without abnormal findings: Secondary | ICD-10-CM

## 2024-06-07 NOTE — Patient Instructions (Signed)
 Tracy Brady , Thank you for taking time out of your busy schedule to complete your Annual Wellness Visit with me. I enjoyed our conversation and look forward to speaking with you again next year. I, as well as your care team,  appreciate your ongoing commitment to your health goals. Please review the following plan we discussed and let me know if I can assist you in the future. Your Game plan/ To Do List    Referrals:You have an order for:  []   2D Mammogram  []   3D Mammogram  [x]   Bone Density     Please call for appointment:   [facility] [address] [place], [place] [address] [phone]   Make sure to wear two-piece clothing.  No lotions, powders, or deodorants the day of the appointment. Make sure to bring picture ID and insurance card.  Bring list of medications you are currently taking including any supplements.   Follow up Visits: Next Medicare AWV with our clinical staff: In 1 year    Have you seen your provider in the last 6 months (3 months if uncontrolled diabetes)? Yes Next Office Visit with your provider: 06/26/24 @ 9:15  Clinician Recommendations:  Aim for 30 minutes of exercise or brisk walking, 6-8 glasses of water, and 5 servings of fruits and vegetables each day.       This is a list of the screening recommended for you and due dates:  Health Maintenance  Topic Date Due   COVID-19 Vaccine (3 - Pfizer risk series) 09/24/2021   DEXA scan (bone density measurement)  08/21/2022   Flu Shot  06/30/2024   Mammogram  09/07/2024   Medicare Annual Wellness Visit  06/07/2025   DTaP/Tdap/Td vaccine (3 - Td or Tdap) 09/07/2027   Colon Cancer Screening  02/26/2031   Hepatitis C Screening  Completed   Zoster (Shingles) Vaccine  Completed   Hepatitis B Vaccine  Aged Out   HPV Vaccine  Aged Out   Meningitis B Vaccine  Aged Out   Pneumococcal Vaccine for age over 57  Discontinued    Advanced directives: (ACP Link)Information on Advanced Care Planning  can be found at [place]  Best boy Advance Health Care Directives Advance Health Care Directives. [email or web address]   Advance Care Planning is important because it:  [x]  Makes sure you receive the medical care that is consistent with your values, goals, and preferences  [x]  It provides guidance to your family and loved ones and reduces their decisional burden about whether or not they are making the right decisions based on your wishes.  Follow the link provided in your after visit summary or read over the paperwork we have mailed to you to help you started getting your Advance Directives in place. If you need assistance in completing these, please reach out to us  so that we can help you!  See attachments for Preventive Care and Fall Prevention Tips.

## 2024-06-07 NOTE — Progress Notes (Signed)
 Subjective:   Tracy Brady is a 68 y.o. who presents for a Medicare Wellness preventive visit.  As a reminder, Annual Wellness Visits don't include a physical exam, and some assessments may be limited, especially if this visit is performed virtually. We may recommend an in-person follow-up visit with your provider if needed.  Visit Complete: Virtual I connected with  Tracy Brady on 06/07/24 by a audio enabled telemedicine application and verified that I am speaking with the correct person using two identifiers.  Patient Location: Home  Provider Location: Home Office  I discussed the limitations of evaluation and management by telemedicine. The patient expressed understanding and agreed to proceed.  Vital Signs: Because this visit was a virtual/telehealth visit, some criteria may be missing or patient reported. Any vitals not documented were not able to be obtained and vitals that have been documented are patient reported.  VideoDeclined- This patient declined Librarian, academic. Therefore the visit was completed with audio only.  Persons Participating in Visit: Patient.  AWV Questionnaire: Yes: Patient Medicare AWV questionnaire was completed by the patient on 06/06/24; I have confirmed that all information answered by patient is correct and no changes since this date.  Cardiac Risk Factors include: advanced age (>50men, >83 women);dyslipidemia;hypertension     Objective:    Today's Vitals   06/07/24 1521  Weight: 131 lb (59.4 kg)  Height: 5' 3 (1.6 m)   Body mass index is 23.21 kg/m.     06/07/2024    3:27 PM 01/01/2024    2:13 PM 04/23/2022    3:43 PM 01/26/2022    5:20 AM 04/23/2021    4:23 PM 06/07/2020    6:46 AM 05/30/2020   10:31 AM  Advanced Directives  Does Patient Have a Medical Advance Directive? No No No No No No No  Type of Agricultural consultant;Living will    Does patient want to make changes to medical  advance directive?     No - Patient declined    Copy of Healthcare Power of Attorney in Chart?     No - copy requested    Would patient like information on creating a medical advance directive? Yes (MAU/Ambulatory/Procedural Areas - Information given)  No - Patient declined No - Patient declined No - Patient declined No - Patient declined No - Patient declined    Current Medications (verified) Outpatient Encounter Medications as of 06/07/2024  Medication Sig   cetirizine (ZYRTEC) 10 MG tablet Take 10 mg by mouth daily.   Cholecalciferol (VITAMIN D3) 125 MCG (5000 UT) CAPS Take by mouth daily.   levothyroxine  (SYNTHROID ) 75 MCG tablet Take 1 tablet (75 mcg total) by mouth daily before breakfast.   METAMUCIL FIBER PO Take by mouth.   rosuvastatin  (CRESTOR ) 10 MG tablet TAKE 1 TABLET BY MOUTH EVERY DAY FOR CHOLESTEROL   No facility-administered encounter medications on file as of 06/07/2024.    Allergies (verified) Patient has no known allergies.   History: Past Medical History:  Diagnosis Date   Allergic rhinitis    Allergy    seasonal allergies   Arthritis    Basal cell carcinoma    Cataract    LEFT-not a surgical candidate at this time (02/11/2021)   Hyperlipidemia    on meds   Hypothyroidism    PONV (postoperative nausea and vomiting)    Thyroid  disease    on meds   Vitamin D  deficiency    Past  Surgical History:  Procedure Laterality Date   COLONOSCOPY  2011   JP-F/V-moviprep(good)-normal   COLONOSCOPY  02/25/2021   TUBAL LIGATION     TYMPANOPLASTY Left    TYMPANOPLASTY Right 06/07/2020   Procedure: TYMPANOPLASTY;  Surgeon: Ethyl Lonni BRAVO, MD;  Location: [facility];  Service: ENT;  Laterality: Right;   WISDOM TOOTH EXTRACTION     Family History  Problem Relation Age of Onset   Hypertension Mother    Heart disease Mother    Hyperlipidemia Mother    Heart attack Mother    Cancer Maternal Uncle        Melanoma, had eye removed   Varicose Veins  Sister    Cancer Sister 37       breast cancer   Colon polyps Neg Hx    Colon cancer Neg Hx    Esophageal cancer Neg Hx    Stomach cancer Neg Hx    Rectal cancer Neg Hx    Social History   Socioeconomic History   Marital status: Married    Spouse name: Not on file   Number of children: Not on file   Years of education: Not on file   Highest education level: GED or equivalent  Occupational History   Not on file  Tobacco Use   Smoking status: Never   Smokeless tobacco: Never  Vaping Use   Vaping status: Never Used  Substance and Sexual Activity   Alcohol use: No   Drug use: No   Sexual activity: Yes    Birth control/protection: None  Other Topics Concern   Not on file  Social History Narrative   Not on file   Social Drivers of Health   Financial Resource Strain: Low Risk  (06/06/2024)   Overall Financial Resource Strain (CARDIA)    Difficulty of Paying Living Expenses: Not hard at all  Food Insecurity: No Food Insecurity (06/06/2024)   Hunger Vital Sign    Worried About Running Out of Food in the Last Year: Never true    Ran Out of Food in the Last Year: Never true  Transportation Needs: No Transportation Needs (06/06/2024)   PRAPARE - Administrator, Civil Service (Medical): No    Lack of Transportation (Non-Medical): No  Physical Activity: Inactive (06/06/2024)   Exercise Vital Sign    Days of Exercise per Week: 0 days    Minutes of Exercise per Session: 0 min  Stress: No Stress Concern Present (06/06/2024)   [employer] of Occupational Health - Occupational Stress Questionnaire    Feeling of Stress: Not at all  Social Connections: Moderately Integrated (06/06/2024)   Social Connection and Isolation Panel    Frequency of Communication with Friends and Family: Once a week    Frequency of Social Gatherings with Friends and Family: Once a week    Attends Religious Services: More than 4 times per year    Active Member of [employer] or Organizations: Yes     Attends Engineer, structural: More than 4 times per year    Marital Status: Married    Tobacco Counseling Counseling given: Not Answered    Clinical Intake:  Pre-visit preparation completed: Yes  Pain : No/denies pain     Diabetes: No  Lab Results  Component Value Date   HGBA1C 5.2 11/16/2023   HGBA1C 5.3 08/10/2023   HGBA1C 5.3 04/28/2023     How often do you need to have someone help you when you read instructions, pamphlets, or  other written materials from your doctor or pharmacy?: 1 - Never  Interpreter Needed?: No  Information entered by :: Charmaine Bloodgood LPN   Activities of Daily Living     06/07/2024    3:26 PM  In your present state of health, do you have any difficulty performing the following activities:  Hearing? 0  Vision? 0  Difficulty concentrating or making decisions? 0  Walking or climbing stairs? 0  Dressing or bathing? 0  Doing errands, shopping? 0  Preparing Food and eating ? N  Using the Toilet? N  In the past six months, have you accidently leaked urine? N  Do you have problems with loss of bowel control? N  Managing your Medications? N  Managing your Finances? N  Housekeeping or managing your Housekeeping? N    Patient Care Team: Kayla Jeoffrey RAMAN, FNP as PCP - General (Family Medicine) Abran Norleen SAILOR, MD as Consulting Physician (Gastroenterology) Joshua Sieving, MD as Consulting Physician (Dermatology) Marcey Elspeth PARAS, MD as Consulting Physician (Ophthalmology) Mammography, [facility] (Diagnostic Radiology)  I have updated your Care Teams any recent Medical Services you may have received from other providers in the past year.     Assessment:   This is a routine wellness examination for Chance.  Hearing/Vision screen Hearing Screening - Comments:: Denies hearing difficulties    Vision Screening - Comments::  up to date with routine eye exams with Dr. Elspeth Marcey    Goals Addressed             This Visit's Progress     Remain active and independent         Depression Screen     06/07/2024    3:25 PM 04/28/2023    4:18 PM 04/23/2022    3:48 PM 04/23/2021    4:27 PM 04/11/2020   11:39 AM 07/07/2019    8:50 AM  PHQ 2/9 Scores  PHQ - 2 Score 0 0 0 0 0 0    Fall Risk     06/07/2024    3:26 PM 04/28/2023    4:18 PM 04/23/2022    3:48 PM 04/23/2021    4:26 PM 04/11/2020   11:39 AM  Fall Risk   Falls in the past year? 0 0 0 0 0   Number falls in past yr: 0  0 0 0  Injury with Fall? 0  0 0 0  Risk for fall due to : No Fall Risks  No Fall Risks No Fall Risks No Fall Risks  Follow up Falls prevention discussed;Education provided;Falls evaluation completed  Falls prevention discussed;Falls evaluation completed  Falls prevention discussed;Falls evaluation completed  Falls prevention discussed      Data saved with a previous flowsheet row definition    MEDICARE RISK AT HOME:  Medicare Risk at Home Any stairs in or around the home?: No If so, are there any without handrails?: No Home free of loose throw rugs in walkways, pet beds, electrical cords, etc?: Yes Adequate lighting in your home to reduce risk of falls?: Yes Life alert?: No Use of a cane, walker or w/c?: No Grab bars in the bathroom?: Yes Shower chair or bench in shower?: No Elevated toilet seat or a handicapped toilet?: Yes  TIMED UP AND GO:  Was the test performed?  No  Cognitive Function: Declined/Normal: No cognitive concerns noted by patient or family. Patient alert, oriented, able to answer questions appropriately and recall recent events. No signs of memory loss or confusion.  07/07/2019    8:52 AM  6CIT Screen  What Year? 0 points  What month? 0 points  What time? 0 points  Count back from 20 0 points  Months in reverse 0 points  Repeat phrase 0 points  Total Score 0 points    Immunizations Immunization History  Administered Date(s) Administered   Influenza Inj Mdck Quad With Preservative 09/06/2017, 09/12/2018    Influenza Whole 09/02/2013   Influenza, High Dose Seasonal PF 08/31/2019, 10/03/2020, 08/27/2021, 10/06/2022, 11/16/2023   Influenza, Seasonal, Injecte, Preservative Fre 11/08/2015   Influenza-Unspecified 07/29/2016   Moderna SARS-COV2 Booster Vaccination 08/27/2021   PFIZER(Purple Top)SARS-COV-2 Vaccination 01/02/2020, 02/01/2020   PPD Test 04/24/2014   Pneumococcal Conjugate-13 05/30/2019   Pneumococcal Polysaccharide-23 04/13/2013   Tdap 10/16/2008, 09/06/2017   Zoster Recombinant(Shingrix) 08/31/2019, 02/21/2020    Screening Tests Health Maintenance  Topic Date Due   COVID-19 Vaccine (3 - Pfizer risk series) 09/24/2021   DEXA SCAN  08/21/2022   INFLUENZA VACCINE  06/30/2024   MAMMOGRAM  09/07/2024   Medicare Annual Wellness (AWV)  06/07/2025   DTaP/Tdap/Td (3 - Td or Tdap) 09/07/2027   Colonoscopy  02/26/2031   Hepatitis C Screening  Completed   Zoster Vaccines- Shingrix  Completed   Hepatitis B Vaccines  Aged Out   HPV VACCINES  Aged Out   Meningococcal B Vaccine  Aged Out   Pneumococcal Vaccine: 50+ Years  Discontinued    Health Maintenance  Health Maintenance Due  Topic Date Due   COVID-19 Vaccine (3 - Pfizer risk series) 09/24/2021   DEXA SCAN  08/21/2022   Health Maintenance Items Addressed: Dexa ordered at last office visit   Additional Screening:  Vision Screening: Recommended annual ophthalmology exams for early detection of glaucoma and other disorders of the eye. Would you like a referral to an eye doctor? No    Dental Screening: Recommended annual dental exams for proper oral hygiene  Community Resource Referral / Chronic Care Management: CRR required this visit?  No   CCM required this visit?  No   Plan:    I have personally reviewed and noted the following in the patient's chart:   Medical and social history Use of alcohol, tobacco or illicit drugs  Current medications and supplements including opioid prescriptions. Patient is not  currently taking opioid prescriptions. Functional ability and status Nutritional status Physical activity Advanced directives List of other physicians Hospitalizations, surgeries, and ER visits in previous 12 months Vitals Screenings to include cognitive, depression, and falls Referrals and appointments  In addition, I have reviewed and discussed with patient certain preventive protocols, quality metrics, and best practice recommendations. A written personalized care plan for preventive services as well as general preventive health recommendations were provided to patient.   Lavelle Pfeiffer [place], [place]   2/0/7974   After Visit Summary: (MyChart) Due to this being a telephonic visit, the after visit summary with patients personalized plan was offered to patient via MyChart   Notes: Nothing significant to report at this time.

## 2024-06-22 ENCOUNTER — Other Ambulatory Visit

## 2024-06-22 DIAGNOSIS — R7309 Other abnormal glucose: Secondary | ICD-10-CM

## 2024-06-22 DIAGNOSIS — E785 Hyperlipidemia, unspecified: Secondary | ICD-10-CM

## 2024-06-22 DIAGNOSIS — E063 Autoimmune thyroiditis: Secondary | ICD-10-CM

## 2024-06-22 DIAGNOSIS — E559 Vitamin D deficiency, unspecified: Secondary | ICD-10-CM

## 2024-06-22 DIAGNOSIS — I10 Essential (primary) hypertension: Secondary | ICD-10-CM

## 2024-06-23 LAB — CBC WITH DIFFERENTIAL/PLATELET
Absolute Lymphocytes: 1615 {cells}/uL (ref 850–3900)
Absolute Monocytes: 520 {cells}/uL (ref 200–950)
Basophils Absolute: 51 {cells}/uL (ref 0–200)
Basophils Relative: 1.1 %
Eosinophils Absolute: 267 {cells}/uL (ref 15–500)
Eosinophils Relative: 5.8 %
HCT: 34.6 % — ABNORMAL LOW (ref 35.0–45.0)
Hemoglobin: 11.3 g/dL — ABNORMAL LOW (ref 11.7–15.5)
MCH: 33.2 pg — ABNORMAL HIGH (ref 27.0–33.0)
MCHC: 32.7 g/dL (ref 32.0–36.0)
MCV: 101.8 fL — ABNORMAL HIGH (ref 80.0–100.0)
MPV: 10.6 fL (ref 7.5–12.5)
Monocytes Relative: 11.3 %
Neutro Abs: 2148 {cells}/uL (ref 1500–7800)
Neutrophils Relative %: 46.7 %
Platelets: 172 Thousand/uL (ref 140–400)
RBC: 3.4 Million/uL — ABNORMAL LOW (ref 3.80–5.10)
RDW: 11.5 % (ref 11.0–15.0)
Total Lymphocyte: 35.1 %
WBC: 4.6 Thousand/uL (ref 3.8–10.8)

## 2024-06-23 LAB — COMPREHENSIVE METABOLIC PANEL WITH GFR
AG Ratio: 1.5 (calc) (ref 1.0–2.5)
ALT: 10 U/L (ref 6–29)
AST: 17 U/L (ref 10–35)
Albumin: 3.6 g/dL (ref 3.6–5.1)
Alkaline phosphatase (APISO): 53 U/L (ref 37–153)
BUN: 20 mg/dL (ref 7–25)
CO2: 27 mmol/L (ref 20–32)
Calcium: 8.6 mg/dL (ref 8.6–10.4)
Chloride: 107 mmol/L (ref 98–110)
Creat: 0.93 mg/dL (ref 0.60–1.00)
Globulin: 2.4 g/dL (ref 1.9–3.7)
Glucose, Bld: 80 mg/dL (ref 65–99)
Potassium: 4.4 mmol/L (ref 3.5–5.3)
Sodium: 142 mmol/L (ref 135–146)
Total Bilirubin: 0.3 mg/dL (ref 0.2–1.2)
Total Protein: 6 g/dL — ABNORMAL LOW (ref 6.1–8.1)
eGFR: 66 mL/min/1.73m2 (ref >=60)

## 2024-06-23 LAB — TSH: TSH: 0.33 m[IU]/L — ABNORMAL LOW (ref 0.40–4.50)

## 2024-06-23 LAB — LIPID PANEL
Cholesterol: 150 mg/dL (ref <200)
HDL: 86 mg/dL (ref >=50)
LDL Cholesterol (Calc): 54 mg/dL
Non-HDL Cholesterol (Calc): 64 mg/dL (ref <130)
Total CHOL/HDL Ratio: 1.7 (calc) (ref <5.0)
Triglycerides: 34 mg/dL (ref <150)

## 2024-06-23 LAB — HEMOGLOBIN A1C
Hgb A1c MFr Bld: 5.3 % (ref <5.7)
Mean Plasma Glucose: 105 mg/dL
eAG (mmol/L): 5.8 mmol/L

## 2024-06-26 ENCOUNTER — Encounter: Payer: Self-pay | Admitting: Family Medicine

## 2024-06-26 ENCOUNTER — Ambulatory Visit (INDEPENDENT_AMBULATORY_CARE_PROVIDER_SITE_OTHER): Admitting: Family Medicine

## 2024-06-26 VITALS — BP 110/78 | HR 82 | Temp 98.4°F | Ht 63.0 in | Wt 144.0 lb

## 2024-06-26 DIAGNOSIS — E063 Autoimmune thyroiditis: Secondary | ICD-10-CM

## 2024-06-26 DIAGNOSIS — I10 Essential (primary) hypertension: Secondary | ICD-10-CM | POA: Diagnosis not present

## 2024-06-26 DIAGNOSIS — E785 Hyperlipidemia, unspecified: Secondary | ICD-10-CM

## 2024-06-26 DIAGNOSIS — D649 Anemia, unspecified: Secondary | ICD-10-CM

## 2024-06-26 DIAGNOSIS — Z13820 Encounter for screening for osteoporosis: Secondary | ICD-10-CM

## 2024-06-26 DIAGNOSIS — Z780 Asymptomatic menopausal state: Secondary | ICD-10-CM

## 2024-06-26 NOTE — Progress Notes (Signed)
 Subjective:  HPI: ALMENDRA LORIA is a 68 y.o. female presenting on 06/26/2024 for Follow-up (Patient was thinking this was supposed to be here physical. )   HPI Patient is in today for chronic condition management  Hypothyroidism: Synthroid  increased from 50mcg daily to 75mcg, TSH 0.33, euthyroid on exam, no biotin use and takes as directed Anemia: baseline, colonoscopy UTD, asymptomatic, not tolerating iron, no hematochezia, melena, hematuria  HYPOTHYROIDISM Thyroid  control status:uncontrolled Satisfied with current treatment? yes Medication side effects: no Medication compliance: excellent compliance Etiology of hypothyroidism:  Recent dose adjustment:yes Fatigue: no Cold intolerance: no Heat intolerance: no Weight gain: no Weight loss: no Constipation: no Diarrhea/loose stools: no Palpitations: no Lower extremity edema: no Anxiety/depressed mood: no     Review of Systems  All other systems reviewed and are negative.   Relevant past medical history reviewed and updated as indicated.   Past Medical History:  Diagnosis Date   Allergic rhinitis    Allergy    seasonal allergies   Arthritis    Basal cell carcinoma    Cataract    LEFT-not a surgical candidate at this time (02/11/2021)   Hyperlipidemia    on meds   Hypothyroidism    PONV (postoperative nausea and vomiting)    Thyroid  disease    on meds   Vitamin D  deficiency      Past Surgical History:  Procedure Laterality Date   COLONOSCOPY  2011   JP-F/V-moviprep(good)-normal   COLONOSCOPY  02/25/2021   TUBAL LIGATION     TYMPANOPLASTY Left    TYMPANOPLASTY Right 06/07/2020   Procedure: TYMPANOPLASTY;  Surgeon: Ethyl Lonni BRAVO, MD;  Location: [facility];  Service: ENT;  Laterality: Right;   WISDOM TOOTH EXTRACTION      Allergies and medications reviewed and updated.   Current Outpatient Medications:    cetirizine (ZYRTEC) 10 MG tablet, Take 10 mg by mouth daily., Disp: ,  Rfl:    Cholecalciferol (VITAMIN D3) 125 MCG (5000 UT) CAPS, Take by mouth daily., Disp: , Rfl:    levothyroxine  (SYNTHROID ) 75 MCG tablet, Take 1 tablet (75 mcg total) by mouth daily before breakfast., Disp: 30 tablet, Rfl: 3   METAMUCIL FIBER PO, Take by mouth., Disp: , Rfl:    rosuvastatin  (CRESTOR ) 10 MG tablet, TAKE 1 TABLET BY MOUTH EVERY DAY FOR CHOLESTEROL, Disp: 90 tablet, Rfl: 1  No Known Allergies  Objective:   BP 110/78   Pulse 82   Temp 98.4 F (36.9 C) (Oral)   Ht 5' 3 (1.6 m)   Wt 144 lb (65.3 kg)   SpO2 97%   BMI 25.51 kg/m      06/26/2024    8:55 AM 06/07/2024    3:21 PM 03/27/2024   11:33 AM  Vitals with BMI  Height 5' 3 5' 3   Weight 144 lbs 131 lbs   BMI 25.51 23.21   Systolic 110 -- 126  Diastolic 78 -- 78  Pulse 82       Physical Exam Vitals and nursing note reviewed.  Constitutional:      Appearance: Normal appearance. She is normal weight.  HENT:     Head: Normocephalic and atraumatic.  Neck:     Thyroid : No thyroid  mass, thyromegaly or thyroid  tenderness.  Cardiovascular:     Rate and Rhythm: Normal rate and regular rhythm.     Pulses: Normal pulses.     Heart sounds: Normal heart sounds.  Pulmonary:     Effort: Pulmonary effort  is normal.     Breath sounds: Normal breath sounds.  Skin:    General: Skin is warm and dry.  Neurological:     General: No focal deficit present.     Mental Status: She is alert and oriented to person, place, and time. Mental status is at baseline.  Psychiatric:        Mood and Affect: Mood normal.        Behavior: Behavior normal.        Thought Content: Thought content normal.        Judgment: Judgment normal.     Assessment & Plan:  Hypothyroidism due to Hashimoto thyroiditis Assessment & Plan: TSH 0.31, synthroid  increased to 75mcg in December due to TSH 8.81. Euthyroid on exam. Decrease synthroid  to 50mcg daily TThSaSu and 75mcg on MWF.   The correct intake of thyroid  hormone (Levothyroxine ,  Synthroid ), is on empty stomach first thing in the morning, with water, separated by at least 30 minutes from breakfast and other medications,  and separated by more than 4 hours from calcium , iron, multivitamins, acid reflux medications (PPIs).   - This medication is a life-long medication and will be needed to correct thyroid  hormone imbalances for the rest of your life.  The dose may change from time to time, based on thyroid  blood work.   - It is extremely important to be consistent taking this medication, near the same time each morning.   -AVOID TAKING PRODUCTS CONTAINING BIOTIN (commonly found in Hair, Skin, Nails vitamins) AS IT INTERFERES WITH THE VALIDITY OF THYROID  FUNCTION BLOOD TESTS.   Orders: -     TSH  Encounter for osteoporosis screening in asymptomatic postmenopausal patient -     DG Bone Density; Future  Hyperlipidemia, unspecified hyperlipidemia type Assessment & Plan: Continue Rosuvastatin . I recommend consuming a heart healthy diet such as Mediterranean diet or DASH diet with whole grains, fruits, vegetable, fish, lean meats, nuts, and olive oil. Limit sweets and processed foods. I also encourage moderate intensity exercise 150 minutes weekly. This is 3-5 times weekly for 30-50 minutes each session. Goal should be pace of 3 miles/hours, or walking 1.5 miles in 30 minutes. The 10-year ASCVD risk score (Arnett DK, et al., 2019) is: 6%    Primary hypertension Assessment & Plan: Chronic well controlled.  Recommend heart healthy diet such as Mediterranean diet with whole grains, fruits, vegetable, fish, lean meats, nuts, and olive oil. Limit salt. Encouraged moderate walking, 3-5 times/week for 30-50 minutes each session. Aim for at least 150 minutes.week. Goal should be pace of 3 miles/hours, or walking 1.5 miles in 30 minutes. Avoid tobacco products. Avoid excess alcohol. Take medications as prescribed and bring medications and blood pressure log with cuff to each office  visit. Seek medical care for chest pain, palpitations, shortness of breath with exertion, dizziness/lightheadedness, vision changes, recurrent headaches, or swelling of extremities.    Anemia, unspecified type Assessment & Plan: Baseline. Does not tolerate iron supplement, colonoscopy UTD.      Follow up plan: Return for TSH labs only in 6 weeks, CPE in December with labs week prior.  Jeoffrey GORMAN Barrio, FNP

## 2024-06-26 NOTE — Assessment & Plan Note (Signed)
 TSH 0.31, synthroid  increased to 75mcg in December due to TSH 8.81. Euthyroid on exam. Decrease synthroid  to 50mcg daily TThSaSu and 75mcg on MWF.   The correct intake of thyroid  hormone (Levothyroxine , Synthroid ), is on empty stomach first thing in the morning, with water, separated by at least 30 minutes from breakfast and other medications,  and separated by more than 4 hours from calcium , iron, multivitamins, acid reflux medications (PPIs).   - This medication is a life-long medication and will be needed to correct thyroid  hormone imbalances for the rest of your life.  The dose may change from time to time, based on thyroid  blood work.   - It is extremely important to be consistent taking this medication, near the same time each morning.   -AVOID TAKING PRODUCTS CONTAINING BIOTIN (commonly found in Hair, Skin, Nails vitamins) AS IT INTERFERES WITH THE VALIDITY OF THYROID  FUNCTION BLOOD TESTS.

## 2024-06-26 NOTE — Assessment & Plan Note (Signed)
 Continue Rosuvastatin . I recommend consuming a heart healthy diet such as Mediterranean diet or DASH diet with whole grains, fruits, vegetable, fish, lean meats, nuts, and olive oil. Limit sweets and processed foods. I also encourage moderate intensity exercise 150 minutes weekly. This is 3-5 times weekly for 30-50 minutes each session. Goal should be pace of 3 miles/hours, or walking 1.5 miles in 30 minutes. The 10-year ASCVD risk score (Arnett DK, et al., 2019) is: 6%

## 2024-06-26 NOTE — Assessment & Plan Note (Signed)
 Chronic well controlled. Recommend heart healthy diet such as Mediterranean diet with whole grains, fruits, vegetable, fish, lean meats, nuts, and olive oil. Limit salt. Encouraged moderate walking, 3-5 times/week for 30-50 minutes each session. Aim for at least 150 minutes.week. Goal should be pace of 3 miles/hours, or walking 1.5 miles in 30 minutes. Avoid tobacco products. Avoid excess alcohol . Take medications as prescribed and bring medications and blood pressure log with cuff to each office visit. Seek medical care for chest pain, palpitations, shortness of breath with exertion, dizziness/lightheadedness, vision changes, recurrent headaches, or swelling of extremities.

## 2024-06-26 NOTE — Assessment & Plan Note (Signed)
 Baseline. Does not tolerate iron supplement, colonoscopy UTD.

## 2024-07-13 ENCOUNTER — Other Ambulatory Visit: Payer: Self-pay | Admitting: Nurse Practitioner

## 2024-07-13 DIAGNOSIS — E063 Autoimmune thyroiditis: Secondary | ICD-10-CM

## 2024-08-07 ENCOUNTER — Other Ambulatory Visit

## 2024-08-07 DIAGNOSIS — E063 Autoimmune thyroiditis: Secondary | ICD-10-CM

## 2024-08-07 LAB — TSH: TSH: 1.57 m[IU]/L (ref 0.40–4.50)

## 2024-08-08 ENCOUNTER — Ambulatory Visit: Payer: Self-pay | Admitting: Family Medicine

## 2024-09-06 ENCOUNTER — Ambulatory Visit: Payer: Self-pay

## 2024-09-06 ENCOUNTER — Encounter: Payer: Self-pay | Admitting: Family Medicine

## 2024-09-06 ENCOUNTER — Ambulatory Visit (INDEPENDENT_AMBULATORY_CARE_PROVIDER_SITE_OTHER): Admitting: Family Medicine

## 2024-09-06 VITALS — BP 128/62 | HR 84 | Temp 98.4°F | Ht 63.0 in | Wt 148.6 lb

## 2024-09-06 DIAGNOSIS — J069 Acute upper respiratory infection, unspecified: Secondary | ICD-10-CM | POA: Diagnosis not present

## 2024-09-06 NOTE — Assessment & Plan Note (Signed)

## 2024-09-06 NOTE — Telephone Encounter (Signed)
 FYI Only or Action Required?: FYI only for provider.  Patient was last seen in primary care on 06/26/2024 by Kayla Jeoffrey RAMAN, FNP.  Called Nurse Triage reporting Headache.  Symptoms began x last Wednesday.  Interventions attempted: Nothing.  Symptoms are: unchanged.  Triage Disposition: See PCP When Office is Open (Within 3 Days)  Patient/caregiver understands and will follow disposition?: Yes     Copied from CRM [id number]. Topic: Clinical - Red Word Triage >> Sep 06, 2024  3:20 PM Tracy Brady wrote: Red Word that prompted transfer to Nurse Triage: patient has a running nose, cough that wont go away, headache Reason for Disposition  [1] MILD-MODERATE headache AND [2] present > 3 days (72 hours) AND [3] no improvement after using Care Advice  Answer Assessment - Initial Assessment Questions 1. LOCATION: Where does it hurt?      Facial pain and head pain 2. ONSET: When did the headache start? (e.g., minutes, hours, days)      Wednesday 3. PATTERN: Does the pain come and go, or has it been constant since it started?     constant 4. SEVERITY: How bad is the pain? and What does it keep you from doing?  (e.g., Scale 1-10; mild, moderate, or severe)     3/10 5. RECURRENT SYMPTOM: Have you ever had headaches before? If Yes, ask: When was the last time? and What happened that time?      no 6. CAUSE: What do you think is causing the headache?     unknown 7. MIGRAINE: Have you been diagnosed with migraine headaches? If Yes, ask: Is this headache similar?      na 8. HEAD INJURY: Has there been any recent injury to your head?      no 9. OTHER SYMPTOMS: Do you have any other symptoms? (e.g., fever, stiff neck, eye pain, sore throat, cold symptoms)     Left ear popping with blowing nose, runny nose, cough-nonproductive 10. PREGNANCY: Is there any chance you are pregnant? When was your last menstrual period?       Na  Feels like in tunnel  Protocols used:  Headache-A-AH

## 2024-09-06 NOTE — Progress Notes (Signed)
 Subjective:  HPI: Tracy Brady is a 68 y.o. female presenting on 09/06/2024 for Medical Management of Chronic Issues (Headaches and cold , cough since Wednesday )   HPI Discussed the use of AI scribe software for clinical note transcription with the patient, who gave verbal consent to proceed.  History of Present Illness Tracy Brady is a 68 year old female who presents with symptoms of a viral upper respiratory infection.  Symptoms began after swimming in a pool last week, where she felt chilled and cold. That evening, she experienced chills and felt hot, suspecting a low-grade fever. The following day, she developed a runny nose, cough, and a sensation of her head being 'in a tunnel'.  The cough has persisted, and she has been taking medication every twelve hours to manage it. She describes the cough as feeling looser than before. No shortness of breath or wheezing. No current fever, chills, or body aches. No nausea, vomiting, or diarrhea.  She has a history of a hole in her eardrum that has not been successfully closed despite attempts. She experienced ear popping when blowing her nose but denies any ear pain.  She has not performed a home COVID and flu test. No one else around her has been sick. Her nasal discharge is clear, and she denies any sinus pain or pressure. She reports a mild headache but no significant pain.    Review of Systems  All other systems reviewed and are negative.   Relevant past medical history reviewed and updated as indicated.   Past Medical History:  Diagnosis Date  . Allergic rhinitis   . Allergy    seasonal allergies  . Arthritis   . Basal cell carcinoma   . Cataract    LEFT-not a surgical candidate at this time (02/11/2021)  . Hyperlipidemia    on meds  . Hypothyroidism   . PONV (postoperative nausea and vomiting)   . Thyroid  disease    on meds  . Vitamin D  deficiency      Past Surgical History:  Procedure Laterality Date  .  COLONOSCOPY  2011   JP-F/V-moviprep(good)-normal  . COLONOSCOPY  02/25/2021  . TUBAL LIGATION    . TYMPANOPLASTY Left   . TYMPANOPLASTY Right 06/07/2020   Procedure: TYMPANOPLASTY;  Surgeon: Ethyl Lonni BRAVO, MD;  Location: [facility];  Service: ENT;  Laterality: Right;  . WISDOM TOOTH EXTRACTION      Allergies and medications reviewed and updated.   Current Outpatient Medications:  .  cetirizine (ZYRTEC) 10 MG tablet, Take 10 mg by mouth daily., Disp: , Rfl:  .  Cholecalciferol (VITAMIN D3) 125 MCG (5000 UT) CAPS, Take by mouth daily., Disp: , Rfl:  .  levothyroxine  (SYNTHROID ) 75 MCG tablet, Take 1 tablet (75 mcg total) by mouth daily before breakfast., Disp: 30 tablet, Rfl: 3 .  METAMUCIL FIBER PO, Take by mouth., Disp: , Rfl:  .  rosuvastatin  (CRESTOR ) 10 MG tablet, TAKE 1 TABLET BY MOUTH EVERY DAY FOR CHOLESTEROL, Disp: 90 tablet, Rfl: 1  No Known Allergies  Objective:   BP 128/62   Pulse 84   Temp 98.4 F (36.9 C)   Ht 5' 3 (1.6 m)   Wt 148 lb 9.6 oz (67.4 kg)   SpO2 95%   BMI 26.32 kg/m      09/06/2024    3:59 PM 06/26/2024    8:55 AM 06/07/2024    3:21 PM  Vitals with BMI  Height 5' 3 5'  3 5' 3  Weight 148 lbs 10 oz 144 lbs 131 lbs  BMI 26.33 25.51 23.21  Systolic 128 110 --  Diastolic 62 78 --  Pulse 84 82      Physical Exam Vitals and nursing note reviewed.  Constitutional:      Appearance: Normal appearance. She is normal weight.  HENT:     Head: Normocephalic and atraumatic.     Right Ear: Ear canal and external ear normal. There is impacted cerumen.     Left Ear: Ear canal and external ear normal. There is impacted cerumen.     Nose: Congestion present.     Right Sinus: No maxillary sinus tenderness or frontal sinus tenderness.     Left Sinus: No maxillary sinus tenderness or frontal sinus tenderness.     Mouth/Throat:     Mouth: Mucous membranes are moist.     Pharynx: Oropharynx is clear.  Eyes:     Extraocular  Movements: Extraocular movements intact.     Conjunctiva/sclera: Conjunctivae normal.     Pupils: Pupils are equal, round, and reactive to light.  Cardiovascular:     Rate and Rhythm: Normal rate and regular rhythm.     Pulses: Normal pulses.     Heart sounds: Normal heart sounds.  Pulmonary:     Effort: Pulmonary effort is normal.     Breath sounds: Normal breath sounds.  Musculoskeletal:     Cervical back: No tenderness.  Lymphadenopathy:     Cervical: No cervical adenopathy.  Skin:    General: Skin is warm and dry.  Neurological:     General: No focal deficit present.     Mental Status: She is alert and oriented to person, place, and time. Mental status is at baseline.  Psychiatric:        Mood and Affect: Mood normal.        Behavior: Behavior normal.        Thought Content: Thought content normal.        Judgment: Judgment normal.     Assessment & Plan:  Viral URI with cough Assessment & Plan: Reassured patient that symptoms and exam findings are most consistent with a viral upper respiratory infection and explained lack of efficacy of antibiotics against viruses.  Discussed expected course and features suggestive of secondary bacterial infection.  Continue supportive care. Increase fluid intake with water or electrolyte solution like pedialyte. Encouraged acetaminophen  as needed for fever/pain. Encouraged salt water gargling, chloraseptic spray and throat lozenges. Encouraged OTC guaifenesin. Encouraged saline sinus flushes and/or neti with humidified air.        Follow up plan: Return if symptoms worsen or fail to improve.  Jeoffrey GORMAN Barrio, FNP

## 2024-09-08 ENCOUNTER — Other Ambulatory Visit: Payer: Self-pay | Admitting: Nurse Practitioner

## 2024-09-08 DIAGNOSIS — E063 Autoimmune thyroiditis: Secondary | ICD-10-CM

## 2024-09-11 ENCOUNTER — Encounter: Payer: Self-pay | Admitting: Family Medicine

## 2024-09-12 ENCOUNTER — Other Ambulatory Visit: Payer: Self-pay | Admitting: Family Medicine

## 2024-09-12 DIAGNOSIS — E063 Autoimmune thyroiditis: Secondary | ICD-10-CM

## 2024-09-12 MED ORDER — LEVOTHYROXINE SODIUM 75 MCG PO TABS: 75.0000 ug | ORAL_TABLET | Freq: Every day | ORAL | 3 refills | Status: AC

## 2024-09-12 MED ORDER — LEVOTHYROXINE SODIUM 50 MCG PO TABS: 50.0000 ug | ORAL_TABLET | Freq: Every day | ORAL | 3 refills | Status: AC

## 2024-09-25 ENCOUNTER — Encounter: Payer: Self-pay | Admitting: Family Medicine

## 2024-09-26 ENCOUNTER — Other Ambulatory Visit: Payer: Self-pay

## 2024-09-26 MED ORDER — ROSUVASTATIN CALCIUM 10 MG PO TABS: ORAL_TABLET | ORAL | 1 refills | Status: AC

## 2024-11-07 ENCOUNTER — Other Ambulatory Visit

## 2024-11-07 DIAGNOSIS — E559 Vitamin D deficiency, unspecified: Secondary | ICD-10-CM

## 2024-11-07 DIAGNOSIS — Z0000 Encounter for general adult medical examination without abnormal findings: Secondary | ICD-10-CM

## 2024-11-08 ENCOUNTER — Ambulatory Visit: Payer: Self-pay | Admitting: Family Medicine

## 2024-11-14 ENCOUNTER — Encounter: Payer: Self-pay | Admitting: Family Medicine

## 2024-11-14 ENCOUNTER — Ambulatory Visit (INDEPENDENT_AMBULATORY_CARE_PROVIDER_SITE_OTHER): Admitting: Family Medicine

## 2024-11-14 VITALS — BP 122/67 | HR 81 | Temp 97.6°F | Ht 63.0 in | Wt 155.4 lb

## 2024-11-14 DIAGNOSIS — Z23 Encounter for immunization: Secondary | ICD-10-CM

## 2024-11-14 DIAGNOSIS — N182 Chronic kidney disease, stage 2 (mild): Secondary | ICD-10-CM | POA: Insufficient documentation

## 2024-11-14 DIAGNOSIS — Z0000 Encounter for general adult medical examination without abnormal findings: Secondary | ICD-10-CM | POA: Diagnosis not present

## 2024-11-14 DIAGNOSIS — I10 Essential (primary) hypertension: Secondary | ICD-10-CM

## 2024-11-14 DIAGNOSIS — E785 Hyperlipidemia, unspecified: Secondary | ICD-10-CM | POA: Diagnosis not present

## 2024-11-14 DIAGNOSIS — Z85828 Personal history of other malignant neoplasm of skin: Secondary | ICD-10-CM

## 2024-11-14 DIAGNOSIS — R7309 Other abnormal glucose: Secondary | ICD-10-CM | POA: Diagnosis not present

## 2024-11-14 DIAGNOSIS — Z860100 Personal history of colon polyps, unspecified: Secondary | ICD-10-CM | POA: Diagnosis not present

## 2024-11-14 DIAGNOSIS — D649 Anemia, unspecified: Secondary | ICD-10-CM

## 2024-11-14 DIAGNOSIS — E063 Autoimmune thyroiditis: Secondary | ICD-10-CM

## 2024-11-14 DIAGNOSIS — L239 Allergic contact dermatitis, unspecified cause: Secondary | ICD-10-CM

## 2024-11-14 DIAGNOSIS — E559 Vitamin D deficiency, unspecified: Secondary | ICD-10-CM

## 2024-11-14 NOTE — Progress Notes (Signed)
 Complete physical exam  Patient: Tracy Brady    DOB: Jun 23, 1956 68 y.o.   MRN: [id number]  Chief Complaint  Patient presents with   Annual Exam    CPE Skin on back itching     Subjective:    Tracy Brady is a 68 y.o. female who presents today for a complete physical exam. She reports consuming a general diet. Gym/ health club routine includes swimming. She generally feels well. She reports sleeping well. She does not have additional problems to discuss today.   PMH includes HTN, Hypothyroidism, HLD, vitamin D  deficiency, anemia, colonic polyps, prediabetes, allergies, BCC, cataracts. Followed by Dermatology  Hypothyroidism: on Synthroid  75mcg, TSH 6.88 Denies anxiety, cold intolerance, constipation, depressed mood, diaphoresis, diarrhea, dry skin, fatigue, hair loss, heat intolerance, leg swelling, nail problem, palpitations, tremors, visual change, weight gain or weight loss   Anemia: does not tolerate iron, colonoscopy with polyps 2022, iron panel pending, fecal occult card ordered Denies melena or hematochezia, fatigue. Does bruise easily  Dermatitis: followed by dermatology, using Triamcinolone and taking Zyrtec  HTN: lifestyle controlled  HLD: on Rosuvastatin  10mg  daily Lipid Panel     Component Value Date/Time   CHOL 152 11/07/2024 1029   TRIG 45 11/07/2024 1029   HDL 87 11/07/2024 1029   CHOLHDL 1.7 11/07/2024 1029   VLDL 8 07/27/2016 0944   LDLCALC 52 11/07/2024 1029   Vitamin D  deficiency: on 5000 units daily  Prediabetes: diet controlled Lab Results  Component Value Date   HGBA1C 5.3 06/22/2024   HGBA1C 5.2 11/16/2023   HGBA1C 5.3 08/10/2023   CKD 2: avoiding NSAIDs, staying hydrated  Discussed the use of AI scribe software for clinical note transcription with the patient, who gave verbal consent to proceed.  History of Present Illness Tracy Brady is a 68 year old female who presents for physical exam.  She experiences intermittent  itching primarily affecting her back, nipples, and areas where her underwear contacts the skin. A dermatologist diagnosed it as dry skin and prescribed triamcinolone cream, which provides relief upon application. She notes a slight rash associated with the itching.  Her thyroid  levels are elevated again, similar to a previous occurrence around December, possibly related to dietary changes during the holidays. She takes Synthroid  in the morning between 6 and 7 AM with water, and her dosage was recently adjusted to 75 mcg daily. No cold or heat intolerance, constipation, diarrhea, depression, anxiety, palpitations, tremors, or vision changes.  She has a history of anemia, which has persisted for an unspecified duration but predates 2019. She underwent a normal colonoscopy in 2022 and denies any blood in her stool or noticeable bleeding. She bruises easily and occasionally consumes red meat and green leafy vegetables. She finds it difficult to take iron pills but has considered using a slow-release form. She reports feeling tired consistently and has not seen a hematologist.  She has been experiencing increased thirst and urination over the past month, which is unusual for her. She denies any new medications and wonders if this could be related to her thyroid  condition. She takes Tylenol  occasionally and ensures she stays hydrated.  She has been exercising regularly at the pool since September 9th, attending daily except Sundays. She enjoys the routine and finds it beneficial for her knee pain, which was aggravated by walking on hard surfaces. She attends the [facility] on [address] at [place].   Most recent fall risk assessment:    11/14/2024    9:18 AM  Fall Risk   Falls in the past year? 1  Number falls in past yr: 0  Injury with Fall? 1  Risk for fall due to : History of fall(s)  Follow up Falls evaluation completed     Most recent depression screenings:    11/14/2024    9:18 AM 09/06/2024     4:02 PM  PHQ 2/9 Scores  PHQ - 2 Score 0 0  PHQ- 9 Score 1 0      Data saved with a previous flowsheet row definition    Vision:Within last year and Dental: No current dental problems Patient Active Problem List   Diagnosis Date Noted   Allergic dermatitis 11/14/2024   Stage 2 chronic kidney disease 11/14/2024   Anemia 06/26/2024   History of colon polyps 04/23/2021   Abnormal glucose (hx of prediabetes) 02/23/2019   Hypothyroidism 11/14/2018   HTN (hypertension) 11/08/2015   Hyperlipidemia    Vitamin D  deficiency    Allergic rhinitis    History of basal cell carcinoma    Past Medical History:  Diagnosis Date   Allergic rhinitis    Allergy    seasonal allergies   Arthritis    Basal cell carcinoma    Cataract    LEFT-not a surgical candidate at this time (02/11/2021)   Hyperlipidemia    on meds   Hypothyroidism    PONV (postoperative nausea and vomiting)    Thyroid  disease    on meds   Vitamin D  deficiency    Past Surgical History:  Procedure Laterality Date   COLONOSCOPY  2011   JP-F/V-moviprep(good)-normal   COLONOSCOPY  02/25/2021   TUBAL LIGATION     TYMPANOPLASTY Left    TYMPANOPLASTY Right 06/07/2020   Procedure: TYMPANOPLASTY;  Surgeon: Ethyl Lonni BRAVO, MD;  Location: [facility];  Service: ENT;  Laterality: Right;   WISDOM TOOTH EXTRACTION     Social History[1] Family History  Problem Relation Age of Onset   Hypertension Mother    Heart disease Mother    Hyperlipidemia Mother    Heart attack Mother    ADD / ADHD Mother    ADD / ADHD Father    Cancer Maternal Uncle        Melanoma, had eye removed   Varicose Veins Sister    Cancer Sister 22       breast cancer   Colon polyps Neg Hx    Colon cancer Neg Hx    Esophageal cancer Neg Hx    Stomach cancer Neg Hx    Rectal cancer Neg Hx    Allergies[2]    Patient Care Team: Kayla Tracy RAMAN, FNP as PCP - General (Family Medicine) Abran Norleen SAILOR, MD as Consulting Physician  (Gastroenterology) Joshua Sieving, MD as Consulting Physician (Dermatology) Marcey Elspeth PARAS, MD as Consulting Physician (Ophthalmology) Mammography, [facility] (Diagnostic Radiology)   Review of Systems  Constitutional: Negative.   HENT: Negative.    Eyes: Negative.   Respiratory: Negative.    Cardiovascular: Negative.   Gastrointestinal: Negative.   Genitourinary: Negative.   Musculoskeletal: Negative.   Skin:  Positive for itching.  Neurological: Negative.   Endo/Heme/Allergies:  Bruises/bleeds easily.  Psychiatric/Behavioral: Negative.    All other systems reviewed and are negative.     Objective:    BP 122/67   Pulse 81   Temp 97.6 F (36.4 C)   Ht 5' 3 (1.6 m)   Wt 155 lb 6.4 oz (70.5 kg)   SpO2 97%   BMI  27.53 kg/m  BP Readings from Last 3 Encounters:  11/14/24 122/67  09/06/24 128/62  06/26/24 110/78   Wt Readings from Last 3 Encounters:  11/14/24 155 lb 6.4 oz (70.5 kg)  09/06/24 148 lb 9.6 oz (67.4 kg)  06/26/24 144 lb (65.3 kg)      Physical Exam Vitals and nursing note reviewed.  Constitutional:      Appearance: Normal appearance. She is overweight.  HENT:     Head: Normocephalic and atraumatic.     Right Ear: Tympanic membrane, ear canal and external ear normal.     Left Ear: Tympanic membrane, ear canal and external ear normal.     Nose: Nose normal.     Mouth/Throat:     Mouth: Mucous membranes are moist.     Pharynx: Oropharynx is clear.  Eyes:     Extraocular Movements: Extraocular movements intact.     Conjunctiva/sclera: Conjunctivae normal.     Pupils: Pupils are equal, round, and reactive to light.  Cardiovascular:     Rate and Rhythm: Normal rate and regular rhythm.     Pulses: Normal pulses.     Heart sounds: Normal heart sounds.  Pulmonary:     Effort: Pulmonary effort is normal.     Breath sounds: Normal breath sounds.  Abdominal:     General: Bowel sounds are normal.     Palpations: Abdomen is soft.  Musculoskeletal:         General: Normal range of motion.     Cervical back: Normal range of motion and neck supple.  Skin:    General: Skin is warm and dry.     Capillary Refill: Capillary refill takes less than 2 seconds.  Neurological:     General: No focal deficit present.     Mental Status: She is alert and oriented to person, place, and time. Mental status is at baseline.  Psychiatric:        Mood and Affect: Mood normal.        Behavior: Behavior normal.        Thought Content: Thought content normal.        Judgment: Judgment normal.       No results found for any visits on 11/14/24. Last CBC Lab Results  Component Value Date   WBC 4.4 11/07/2024   HGB 11.4 (L) 11/07/2024   HCT 34.6 (L) 11/07/2024   MCV 99.4 11/07/2024   MCH 32.8 11/07/2024   RDW 11.8 11/07/2024   PLT 164 11/07/2024   Last metabolic panel Lab Results  Component Value Date   GLUCOSE 85 11/07/2024   NA 142 11/07/2024   K 4.5 11/07/2024   CL 108 11/07/2024   CO2 30 11/07/2024   BUN 16 11/07/2024   CREATININE 0.92 11/07/2024   EGFR 67 11/07/2024   CALCIUM  8.7 11/07/2024   PROT 6.1 11/07/2024   ALBUMIN 4.0 01/26/2022   BILITOT 0.5 11/07/2024   ALKPHOS 47 01/26/2022   AST 18 11/07/2024   ALT 12 11/07/2024   ANIONGAP 11 01/26/2022   Last lipids Lab Results  Component Value Date   CHOL 152 11/07/2024   HDL 87 11/07/2024   LDLCALC 52 11/07/2024   TRIG 45 11/07/2024   CHOLHDL 1.7 11/07/2024   Last hemoglobin A1c Lab Results  Component Value Date   HGBA1C 5.3 06/22/2024   Last thyroid  functions Lab Results  Component Value Date   TSH 6.88 (H) 11/07/2024   THYROIDAB 14 (H) 10/13/2018   Last vitamin D  Lab  Results  Component Value Date   VD25OH 63 11/07/2024   Last vitamin B12 and Folate Lab Results  Component Value Date   VITAMINB12 363 10/03/2020         Assessment & Plan:    Routine Health Maintenance and Physical Exam Immunization History  Administered Date(s) Administered   INFLUENZA, HIGH  DOSE SEASONAL PF 08/31/2019, 10/03/2020, 08/27/2021, 10/06/2022, 11/16/2023, 11/14/2024   Influenza Inj Mdck Quad With Preservative 09/06/2017, 09/12/2018   Influenza Whole 09/02/2013   Influenza, Seasonal, Injecte, Preservative Fre 11/08/2015   Influenza-Unspecified 07/29/2016   Moderna SARS-COV2 Booster Vaccination 08/27/2021   PFIZER(Purple Top)SARS-COV-2 Vaccination 01/02/2020, 02/01/2020   PPD Test 04/24/2014   Pneumococcal Conjugate-13 05/30/2019   Pneumococcal Polysaccharide-23 04/13/2013   Tdap 10/16/2008, 09/06/2017   Zoster Recombinant(Shingrix) 08/31/2019, 02/21/2020    Health Maintenance  Topic Date Due   COVID-19 Vaccine (3 - Pfizer risk series) 11/30/2024 (Originally 09/24/2021)   Mammogram  11/14/2025 (Originally 09/07/2024)   Bone Density Scan  11/14/2025 (Originally 08/21/2022)   Medicare Annual Wellness (AWV)  06/07/2025   DTaP/Tdap/Td (3 - Td or Tdap) 09/07/2027   Colonoscopy  02/26/2031   Influenza Vaccine  Completed   Hepatitis C Screening  Completed   Zoster Vaccines- Shingrix  Completed   Meningococcal B Vaccine  Aged Out   Pneumococcal Vaccine: 50+ Years  Discontinued    Discussed health benefits of physical activity, and encouraged her to engage in regular exercise appropriate for her age and condition.  Problem List Items Addressed This Visit       Cardiovascular and Mediastinum   HTN (hypertension)     Endocrine   Hypothyroidism     Musculoskeletal and Integument   History of basal cell carcinoma   Allergic dermatitis     Genitourinary   Stage 2 chronic kidney disease     Other   Hyperlipidemia   Vitamin D  deficiency   Abnormal glucose (hx of prediabetes)   History of colon polyps   Anemia   Relevant Orders   Fecal Globin By Immunochemistry   Other Visit Diagnoses       Physical exam, annual    -  Primary     Need for vaccination       Relevant Orders   Flu vaccine HIGH DOSE PF(Fluzone Trivalent) (Completed)       Assessment  and Plan Assessment & Plan Dermatitis Intermittent pruritus with mild rash, relieved by triamcinolone cream. - Continue triamcinolone cream as needed for pruritus. - Followed by Dermatology  History of basal cell carcinoma - Continue annual dermatology visits  Hypothyroidism Thyroid  levels elevated. Current regimen includes levothyroxine  75 mcg every other day and 50 mcg on alternate days. - Increased levothyroxine  to 75 mcg daily.  Iron deficiency anemia Chronic anemia with low iron levels. Normal colonoscopy in 2022. No blood in stool, but easy bruising and blood blisters noted. Occasional dietary iron intake. No prior hematology consultation. - Ordered stool card to check for occult blood. - Encouraged increased dietary iron intake, including red meat and green leafy vegetables. - Consider starting slow-release iron supplements.  Chronic kidney disease stage 2 Mild stage 2 chronic kidney disease. - Advised to avoid NSAIDs and maintain hydration.  Hyperlipidemia Cholesterol levels well-controlled with current medication regimen. - Continue rosuvastatin  10 mg daily.  Prediabetes Recent HbA1c of 5.3, indicating good control. - Continue current management and lifestyle modifications.  General Health Maintenance Routine health maintenance discussed. Mammogram and DEXA scan scheduled. Blood pressure and cholesterol levels well-controlled. Vitamin D   supplementation ongoing. - Administered flu shot today. - Ensure mammogram and DEXA scan results are sent to the provider. - Continue vitamin D  supplementation.    Return in about 3 months (around 02/12/2025) for chronic follow-up with labs 1 week prior.    Tracy GORMAN Barrio, FNP [facility]       [1]  Social History Tobacco Use   Smoking status: Never   Smokeless tobacco: Never  Vaping Use   Vaping status: Never Used  Substance Use Topics   Alcohol use: Never   Drug use: Never  [2] No Known  Allergies

## 2024-11-15 ENCOUNTER — Telehealth: Payer: Self-pay

## 2024-11-15 ENCOUNTER — Encounter: Payer: Medicare HMO | Admitting: Nurse Practitioner

## 2024-11-15 LAB — TEST AUTHORIZATION

## 2024-11-15 LAB — CBC WITH DIFFERENTIAL/PLATELET
Absolute Lymphocytes: 1223 {cells}/uL (ref 850–3900)
Absolute Monocytes: 392 {cells}/uL (ref 200–950)
Basophils Absolute: 62 {cells}/uL (ref 0–200)
Basophils Relative: 1.4 %
Eosinophils Absolute: 229 {cells}/uL (ref 15–500)
Eosinophils Relative: 5.2 %
HCT: 34.6 % — ABNORMAL LOW (ref 35.9–46.0)
Hemoglobin: 11.4 g/dL — ABNORMAL LOW (ref 11.7–15.5)
MCH: 32.8 pg (ref 27.0–33.0)
MCHC: 32.9 g/dL (ref 31.6–35.4)
MCV: 99.4 fL (ref 81.4–101.7)
MPV: 10.8 fL (ref 7.5–12.5)
Monocytes Relative: 8.9 %
Neutro Abs: 2495 {cells}/uL (ref 1500–7800)
Neutrophils Relative %: 56.7 %
Platelets: 164 Thousand/uL (ref 140–400)
RBC: 3.48 Million/uL — ABNORMAL LOW (ref 3.80–5.10)
RDW: 11.8 % (ref 11.0–15.0)
Total Lymphocyte: 27.8 %
WBC: 4.4 Thousand/uL (ref 3.8–10.8)

## 2024-11-15 LAB — VITAMIN D 25 HYDROXY (VIT D DEFICIENCY, FRACTURES): Vit D, 25-Hydroxy: 63 ng/mL (ref 30–100)

## 2024-11-15 LAB — COMPREHENSIVE METABOLIC PANEL WITH GFR
AG Ratio: 1.5 (calc) (ref 1.0–2.5)
ALT: 12 U/L (ref 6–29)
AST: 18 U/L (ref 10–35)
Albumin: 3.7 g/dL (ref 3.6–5.1)
Alkaline phosphatase (APISO): 55 U/L (ref 37–153)
BUN: 16 mg/dL (ref 7–25)
CO2: 30 mmol/L (ref 20–32)
Calcium: 8.7 mg/dL (ref 8.6–10.4)
Chloride: 108 mmol/L (ref 98–110)
Creat: 0.92 mg/dL (ref 0.60–1.00)
Globulin: 2.4 g/dL (ref 1.9–3.7)
Glucose, Bld: 85 mg/dL (ref 65–99)
Potassium: 4.5 mmol/L (ref 3.5–5.3)
Sodium: 142 mmol/L (ref 135–146)
Total Bilirubin: 0.5 mg/dL (ref 0.2–1.2)
Total Protein: 6.1 g/dL (ref 6.1–8.1)
eGFR: 67 mL/min/1.73m2 (ref >=60)

## 2024-11-15 LAB — LIPID PANEL
Cholesterol: 152 mg/dL (ref <200)
HDL: 87 mg/dL (ref >=50)
LDL Cholesterol (Calc): 52 mg/dL
Non-HDL Cholesterol (Calc): 65 mg/dL (ref <130)
Total CHOL/HDL Ratio: 1.7 (calc) (ref <5.0)
Triglycerides: 45 mg/dL (ref <150)

## 2024-11-15 LAB — IRON,TIBC AND FERRITIN PANEL
%SAT: 26 % (ref 16–45)
Ferritin: 30 ng/mL (ref 16–288)
Iron: 83 ug/dL (ref 45–160)
TIBC: 317 ug/dL (ref 250–450)

## 2024-11-15 LAB — HEMOGLOBIN A1C

## 2024-11-15 LAB — TSH: TSH: 6.88 m[IU]/L — ABNORMAL HIGH (ref 0.40–4.50)

## 2024-11-15 NOTE — Telephone Encounter (Signed)
 [facility] FAXED IN PHYSICIANS ORDERS REQUESTING ORDERS AND ICD-10 CODES FOR DEXA SCANS.   ORDERS REVIEWED AND SIGNED BY PROVIDER AMBER HOWARD ON 11/15/2024  PHYSICIAN ORDER :  BONE DENSITY: LAST 08/21/2020 ICD - 10 CODE OSTEOPENIA : M85.80  ORDERS FAXED TO [facility] AT PHONE:[phone] FAX:[phone]

## 2024-11-23 LAB — FECAL GLOBIN BY IMMUNOCHEMISTRY
FECAL GLOBIN RESULT:: NOT DETECTED
MICRO NUMBER:: 17397090
SPECIMEN QUALITY:: ADEQUATE

## 2024-11-27 ENCOUNTER — Ambulatory Visit (INDEPENDENT_AMBULATORY_CARE_PROVIDER_SITE_OTHER): Admitting: Family Medicine

## 2024-11-27 ENCOUNTER — Encounter: Payer: Self-pay | Admitting: Family Medicine

## 2024-11-27 VITALS — BP 119/71 | HR 110 | Temp 98.9°F | Ht 63.0 in | Wt 154.0 lb

## 2024-11-27 DIAGNOSIS — J069 Acute upper respiratory infection, unspecified: Secondary | ICD-10-CM | POA: Diagnosis not present

## 2024-11-27 NOTE — Progress Notes (Signed)
 "  Acute Office Visit  Patient ID: Tracy Brady, female    DOB: 13-Jul-1956, 68 y.o.   MRN: [id number]  PCP: Kayla Jeoffrey RAMAN, FNP  Chief Complaint  Patient presents with   Acute Visit    Cough, low grade fever, headache since Friday night otc not helping      Subjective:     HPI  Discussed the use of AI scribe software for clinical note transcription with the patient, who gave verbal consent to proceed.  History of Present Illness Tracy Brady is a 68 year old female who presents with a headache and cough.  Her symptoms began on Friday night after having her grandchildren over on Thursday. She experiences a severe headache every morning and a persistent cough that is intense enough to induce vomiting of phlegm. No nausea, but she has a lack of appetite and altered taste, making it difficult to eat.  She has experienced chills but no fever since Friday night. She reports a scratchy throat, runny nose, and nasal congestion, along with a little wheezing but no shortness of breath. No sinus pressure, sore throat, or tooth pain.  She has not conducted any COVID or flu tests at home. She received a flu shot during her last visit. She is currently taking Mucinex, Delsym, and Tylenol . Tylenol  provides temporary relief from her headache, but the pain returns once the medication wears off.  She is struggling to stay hydrated as even water tastes bad, and she has unintentionally lost five pounds. She feels slightly better now than she did over the weekend.   Review of Systems  All other systems reviewed and are negative.   Past Medical History:  Diagnosis Date   Allergic rhinitis    Allergy    seasonal allergies   Arthritis    Basal cell carcinoma    Cataract    LEFT-not a surgical candidate at this time (02/11/2021)   Hyperlipidemia    on meds   Hypothyroidism    PONV (postoperative nausea and vomiting)    Thyroid  disease    on meds   Vitamin D  deficiency     Past Surgical  History:  Procedure Laterality Date   COLONOSCOPY  2011   JP-F/V-moviprep(good)-normal   COLONOSCOPY  02/25/2021   TUBAL LIGATION     TYMPANOPLASTY Left    TYMPANOPLASTY Right 06/07/2020   Procedure: TYMPANOPLASTY;  Surgeon: Ethyl Lonni BRAVO, MD;  Location: [facility];  Service: ENT;  Laterality: Right;   WISDOM TOOTH EXTRACTION      Outpatient Medications Prior to Visit  Medication Sig Dispense Refill   cetirizine (ZYRTEC) 10 MG tablet Take 10 mg by mouth daily.     Cholecalciferol (VITAMIN D3) 125 MCG (5000 UT) CAPS Take by mouth daily.     levothyroxine  (SYNTHROID ) 75 MCG tablet Take 1 tablet (75 mcg total) by mouth daily before breakfast. To take Monday, Wednesday Friday 30 tablet 3   rosuvastatin  (CRESTOR ) 10 MG tablet TAKE 1 TABLET BY MOUTH EVERY DAY FOR CHOLESTEROL 90 tablet 1   triamcinolone cream (KENALOG) 0.1 % Apply 1 Application topically 2 (two) times daily.     levothyroxine  (SYNTHROID ) 50 MCG tablet Take 1 tablet (50 mcg total) by mouth daily. 4 days weekly (Patient not taking: Reported on 11/27/2024) 90 tablet 3   METAMUCIL FIBER PO Take by mouth. (Patient not taking: Reported on 11/27/2024)     No facility-administered medications prior to visit.    Allergies[1]  Objective:    BP 119/71   Pulse (!) 110   Temp 98.9 F (37.2 C) (Oral)   Ht 5' 3 (1.6 m)   Wt 154 lb (69.9 kg)   SpO2 97%   BMI 27.28 kg/m  BP Readings from Last 3 Encounters:  11/27/24 119/71  11/14/24 122/67  09/06/24 128/62   Wt Readings from Last 3 Encounters:  11/27/24 154 lb (69.9 kg)  11/14/24 155 lb 6.4 oz (70.5 kg)  09/06/24 148 lb 9.6 oz (67.4 kg)      Physical Exam Vitals and nursing note reviewed.  Constitutional:      Appearance: Normal appearance. She is normal weight.  HENT:     Head: Normocephalic and atraumatic.     Right Ear: Tympanic membrane, ear canal and external ear normal.     Left Ear: Tympanic membrane, ear canal and external ear  normal.     Nose: Congestion present.     Right Sinus: No maxillary sinus tenderness or frontal sinus tenderness.     Left Sinus: No maxillary sinus tenderness or frontal sinus tenderness.     Mouth/Throat:     Mouth: Mucous membranes are moist.     Pharynx: Oropharynx is clear.  Eyes:     Extraocular Movements: Extraocular movements intact.     Conjunctiva/sclera: Conjunctivae normal.     Pupils: Pupils are equal, round, and reactive to light.  Cardiovascular:     Rate and Rhythm: Normal rate and regular rhythm.     Pulses: Normal pulses.     Heart sounds: Normal heart sounds.  Pulmonary:     Effort: Pulmonary effort is normal.     Breath sounds: Normal breath sounds.  Musculoskeletal:     Cervical back: No tenderness.  Lymphadenopathy:     Cervical: No cervical adenopathy.  Skin:    General: Skin is warm and dry.  Neurological:     General: No focal deficit present.     Mental Status: She is alert and oriented to person, place, and time. Mental status is at baseline.  Psychiatric:        Mood and Affect: Mood normal.        Behavior: Behavior normal.        Thought Content: Thought content normal.        Judgment: Judgment normal.       No results found for any visits on 11/27/24.     Assessment & Plan:   Problem List Items Addressed This Visit       Respiratory   Viral URI with cough - Primary    Assessment and Plan Assessment & Plan Acute upper respiratory infection Symptoms suggest viral etiology, likely a common cold. No bacterial infection or sinusitis. Symptoms improving, past peak of illness. - Continue Mucinex, Delsym, Tylenol . - Encourage hydration with fluids, electrolyte solutions or broth. - Use Flonase for nasal symptoms and postnasal drip. - Monitor for bacterial sinus infection or pneumonia symptoms. - Avoid contact with others until symptoms improve. - Follow up if symptoms persist or worsen    No orders of the defined types were placed  in this encounter.   Return if symptoms worsen or fail to improve.  Jeoffrey GORMAN Barrio, FNP [facility]      [1] No Known Allergies  "

## 2024-11-27 NOTE — Patient Instructions (Signed)
 Your symptoms and exam findings are most consistent with a viral upper respiratory infection. These usually run their course in 5-7 days. Unfortunately, antibiotics don't work against viruses and just increase your risk of other issues such as diarrhea, yeast infections, and resistant infections.  If your symptoms last longer than 10 days and/or you start feeling worse with facial pain, high fever, cough, shortness of breath or start feeling significantly worse, please call us right away to be further evaluated.  Some things that can make you feel better are: - Increased rest - Increasing fluid with water/sugar free electrolytes - Acetaminophen as needed for fever/pain.  - Salt water gargling, chloraseptic spray and throat lozenges - OTC guaifenesin (Mucinex).  - Saline sinus flushes or a neti pot.  - Humidifying the air.

## 2025-01-09 ENCOUNTER — Other Ambulatory Visit

## 2025-02-14 ENCOUNTER — Ambulatory Visit: Admitting: Family Medicine

## 2025-06-13 ENCOUNTER — Ambulatory Visit
